# Patient Record
Sex: Female | Born: 1955 | Race: Black or African American | Hispanic: No | Marital: Single | Smoking: Former smoker
Health system: Southern US, Community
[De-identification: ages and names within clinical notes are randomized; demographics above are authoritative.]

## PROBLEM LIST (undated history)

## (undated) DIAGNOSIS — C50919 Malignant neoplasm of unspecified site of unspecified female breast: Secondary | ICD-10-CM

## (undated) DIAGNOSIS — I10 Essential (primary) hypertension: Secondary | ICD-10-CM

## (undated) DIAGNOSIS — J45909 Unspecified asthma, uncomplicated: Secondary | ICD-10-CM

## (undated) DIAGNOSIS — C801 Malignant (primary) neoplasm, unspecified: Secondary | ICD-10-CM

## (undated) DIAGNOSIS — Z9221 Personal history of antineoplastic chemotherapy: Secondary | ICD-10-CM

## (undated) DIAGNOSIS — Z923 Personal history of irradiation: Secondary | ICD-10-CM

## (undated) DIAGNOSIS — J449 Chronic obstructive pulmonary disease, unspecified: Secondary | ICD-10-CM

## (undated) HISTORY — PX: BREAST BIOPSY: SHX20

---

## 2015-12-25 ENCOUNTER — Telehealth: Payer: Self-pay

## 2015-12-25 NOTE — Telephone Encounter (Signed)
Pt called. She received a triage letter. Please call [phone]

## 2015-12-25 NOTE — Telephone Encounter (Signed)
Triaged pt today.  

## 2015-12-29 ENCOUNTER — Telehealth: Payer: Self-pay

## 2015-12-30 NOTE — Telephone Encounter (Signed)
LMOM for [facility], I have a question about the pt's meds.

## 2016-01-08 NOTE — Telephone Encounter (Signed)
Completed the triage.  

## 2016-01-12 NOTE — Telephone Encounter (Signed)
See separate triage.  

## 2016-01-12 NOTE — Telephone Encounter (Signed)
Gastroenterology Pre-Procedure Review  Request Date:12/29/2015 Requesting Physician: Burman Freestone, NP  PATIENT REVIEW QUESTIONS: The patient responded to the following health history questions as indicated:    Information given by pt's daughter, Jana Glasner She said that pt signs for herself  1. Diabetes Melitis: no 2. Joint replacements in the past 12 months: no 3. Major health problems in the past 3 months: no 4. Has an artificial valve or MVP: no 5. Has a defibrillator: no 6. Has been advised in past to take antibiotics in advance of a procedure like teeth cleaning: no 7. Family history of colon cancer: no  8. Alcohol Use: no 9. History of sleep apnea: no     MEDICATIONS & ALLERGIES:    Patient reports the following regarding taking any blood thinners:   Plavix? no Aspirin? no Coumadin? no  Patient confirms/reports the following medications:  Current Outpatient Prescriptions  Medication Sig Dispense Refill  . hydrochlorothiazide (MICROZIDE) 12.5 MG capsule Take 12.5 mg by mouth daily.    Marland Kitchen lisinopril (PRINIVIL,ZESTRIL) 10 MG tablet Take 10 mg by mouth daily.     No current facility-administered medications for this visit.    Patient confirms/reports the following allergies:  No Known Allergies  No orders of the defined types were placed in this encounter.    AUTHORIZATION INFORMATION Primary Insurance:   ID #:  Group #:  Pre-Cert / Auth required:  Pre-Cert / Auth #:   Secondary Insurance:   ID #:  Group #:  Pre-Cert / Auth required:  Pre-Cert / Auth #:   SCHEDULE INFORMATION: Procedure has been scheduled as follows:  Date:                 Time:   Location:   This Gastroenterology Pre-Precedure Review Form is being routed to the following provider(s): Barney Drain, MD

## 2016-01-13 NOTE — Telephone Encounter (Signed)
Needs appt

## 2016-01-13 NOTE — Telephone Encounter (Signed)
PREPOPIK-DRINK WATER TO KEEP URINE LIGHT YELLOW.   HOLD HYDROCHLOROTHIAZIDE on day BEFORE AND morning of TCS

## 2016-01-27 MED ORDER — SOD PICOSULFATE-MAG OX-CIT ACD 10-3.5-12 MG-GM-GM PO PACK: 1.0000 | PACK | ORAL | Status: DC

## 2016-01-27 NOTE — Telephone Encounter (Signed)
Pt requested a Thursday and has been scheduled for 03/18/2016 at 10:30 Am with Dr. Oneida Alar. Rx sent to the pharmacy and instructions mailed to pt.

## 2016-03-10 ENCOUNTER — Telehealth: Payer: Self-pay

## 2016-03-10 ENCOUNTER — Other Ambulatory Visit: Payer: Self-pay

## 2016-03-10 DIAGNOSIS — Z1211 Encounter for screening for malignant neoplasm of colon: Secondary | ICD-10-CM

## 2016-03-10 NOTE — Telephone Encounter (Signed)
I called pt to update triage prior to colonoscopy scheduled for 03/18/2016 and she said there has been no change in her meds.

## 2016-03-11 NOTE — Telephone Encounter (Signed)
REVIEWED-NO ADDITIONAL RECOMMENDATIONS. 

## 2016-03-16 NOTE — Telephone Encounter (Signed)
NO PA NEEDED FOR TCS PRE ONLINE WEB PAGE

## 2016-03-18 ENCOUNTER — Encounter (HOSPITAL_COMMUNITY)
Admission: RE | Disposition: A | Discharge status: Home / Self Care | Payer: Self-pay | Source: Ambulatory Visit | Attending: Gastroenterology

## 2016-03-18 ENCOUNTER — Encounter (HOSPITAL_COMMUNITY): Payer: Self-pay

## 2016-03-18 ENCOUNTER — Ambulatory Visit (HOSPITAL_COMMUNITY)
Admission: RE | Admit: 2016-03-18 | Discharge: 2016-03-18 | Disposition: A | Discharge status: Home / Self Care | Payer: Medicaid Other | Source: Ambulatory Visit | Attending: Gastroenterology | Admitting: Gastroenterology

## 2016-03-18 DIAGNOSIS — J45909 Unspecified asthma, uncomplicated: Secondary | ICD-10-CM | POA: Insufficient documentation

## 2016-03-18 DIAGNOSIS — D123 Benign neoplasm of transverse colon: Secondary | ICD-10-CM

## 2016-03-18 DIAGNOSIS — K644 Residual hemorrhoidal skin tags: Secondary | ICD-10-CM | POA: Diagnosis not present

## 2016-03-18 DIAGNOSIS — Z1211 Encounter for screening for malignant neoplasm of colon: Secondary | ICD-10-CM | POA: Insufficient documentation

## 2016-03-18 DIAGNOSIS — Z79899 Other long term (current) drug therapy: Secondary | ICD-10-CM | POA: Diagnosis not present

## 2016-03-18 DIAGNOSIS — K648 Other hemorrhoids: Secondary | ICD-10-CM | POA: Diagnosis not present

## 2016-03-18 DIAGNOSIS — Q438 Other specified congenital malformations of intestine: Secondary | ICD-10-CM | POA: Insufficient documentation

## 2016-03-18 DIAGNOSIS — D128 Benign neoplasm of rectum: Secondary | ICD-10-CM | POA: Insufficient documentation

## 2016-03-18 DIAGNOSIS — K5730 Diverticulosis of large intestine without perforation or abscess without bleeding: Secondary | ICD-10-CM | POA: Diagnosis not present

## 2016-03-18 DIAGNOSIS — I10 Essential (primary) hypertension: Secondary | ICD-10-CM | POA: Diagnosis not present

## 2016-03-18 DIAGNOSIS — F17200 Nicotine dependence, unspecified, uncomplicated: Secondary | ICD-10-CM | POA: Insufficient documentation

## 2016-03-18 HISTORY — DX: Unspecified asthma, uncomplicated: J45.909

## 2016-03-18 HISTORY — DX: Essential (primary) hypertension: I10

## 2016-03-18 HISTORY — PX: COLONOSCOPY: SHX5424

## 2016-03-18 SURGERY — COLONOSCOPY
Anesthesia: Moderate Sedation

## 2016-03-18 MED ORDER — MEPERIDINE HCL 100 MG/ML IJ SOLN
INTRAMUSCULAR | Status: AC
  Filled 2016-03-18: qty 2

## 2016-03-18 MED ORDER — MEPERIDINE HCL 100 MG/ML IJ SOLN
INTRAMUSCULAR | Status: DC | PRN
  Administered 2016-03-18 (×2): 25 mg via INTRAVENOUS

## 2016-03-18 MED ORDER — MIDAZOLAM HCL 5 MG/5ML IJ SOLN
INTRAMUSCULAR | Status: DC | PRN
  Administered 2016-03-18 (×2): 2 mg via INTRAVENOUS
  Administered 2016-03-18: 1 mg via INTRAVENOUS

## 2016-03-18 MED ORDER — SIMETHICONE 40 MG/0.6ML PO SUSP
ORAL | Status: DC | PRN
  Administered 2016-03-18: 10:00:00

## 2016-03-18 MED ORDER — MIDAZOLAM HCL 5 MG/5ML IJ SOLN
INTRAMUSCULAR | Status: AC
  Filled 2016-03-18: qty 10

## 2016-03-18 MED ORDER — SODIUM CHLORIDE 0.9 % IV SOLN
INTRAVENOUS | Status: DC
Start: 2016-03-18 — End: 2016-03-18
  Administered 2016-03-18: 10:00:00 via INTRAVENOUS

## 2016-03-18 NOTE — Discharge Instructions (Signed)
You have moderate internal and large external hemorrhoids and diverticulosis IN YOUR RIGHT COLON. YOU HAD THREE POLYPS REMOVED.   DRINK WATER TO KEEP YOUR URINE LIGHT YELLOW.  FOLLOW A HIGH FIBER DIET. AVOID ITEMS THAT CAUSE BLOATING. See info below.  YOUR BIOPSY RESULTS WILL BE AVAILABLE IN MY CHART JUL 3 AND MY OFFICE WILL CONTACT YOU IN 10-14 DAYS WITH YOUR RESULTS.   Next colonoscopy in 3 years.  Colonoscopy Care After Read the instructions outlined below and refer to this sheet in the next week. These discharge instructions provide you with general information on caring for yourself after you leave the hospital. While your treatment has been planned according to the most current medical practices available, unavoidable complications occasionally occur. If you have any problems or questions after discharge, call DR. FIELDS, [phone].  ACTIVITY  You may resume your regular activity, but move at a slower pace for the next 24 hours.   Take frequent rest periods for the next 24 hours.   Walking will help get rid of the air and reduce the bloated feeling in your belly (abdomen).   No driving for 24 hours (because of the medicine (anesthesia) used during the test).   You may shower.   Do not sign any important legal documents or operate any machinery for 24 hours (because of the anesthesia used during the test).    NUTRITION  Drink plenty of fluids.   You may resume your normal diet as instructed by your doctor.   Begin with a light meal and progress to your normal diet. Heavy or fried foods are harder to digest and may make you feel sick to your stomach (nauseated).   Avoid alcoholic beverages for 24 hours or as instructed.    MEDICATIONS  You may resume your normal medications.   WHAT YOU CAN EXPECT TODAY  Some feelings of bloating in the abdomen.   Passage of more gas than usual.   Spotting of blood in your stool or on the toilet paper  .  IF YOU HAD POLYPS  REMOVED DURING THE COLONOSCOPY:  Eat a soft diet IF YOU HAVE NAUSEA, BLOATING, ABDOMINAL PAIN, OR VOMITING.    FINDING OUT THE RESULTS OF YOUR TEST Not all test results are available during your visit. DR. Oneida Alar WILL CALL YOU WITHIN 14 DAYS OF YOUR PROCEDUE WITH YOUR RESULTS. Do not assume everything is normal if you have not heard from DR. FIELDS, CALL HER OFFICE AT [phone].  SEEK IMMEDIATE MEDICAL ATTENTION AND CALL THE OFFICE: [phone] IF:  You have more than a spotting of blood in your stool.   Your belly is swollen (abdominal distention).   You are nauseated or vomiting.   You have a temperature over 101F.   You have abdominal pain or discomfort that is severe or gets worse throughout the day.  High-Fiber Diet A high-fiber diet changes your normal diet to include more whole grains, legumes, fruits, and vegetables. Changes in the diet involve replacing refined carbohydrates with unrefined foods. The calorie level of the diet is essentially unchanged. The Dietary Reference Intake (recommended amount) for adult males is 38 grams per day. For adult females, it is 25 grams per day. Pregnant and lactating women should consume 28 grams of fiber per day. Fiber is the intact part of a plant that is not broken down during digestion. Functional fiber is fiber that has been isolated from the plant to provide a beneficial effect in the body. PURPOSE  Increase stool bulk.  Ease and regulate bowel movements.   Lower cholesterol.  REDUCE RISK OF COLON CANCER  INDICATIONS THAT YOU NEED MORE FIBER  Constipation and hemorrhoids.   Uncomplicated diverticulosis (intestine condition) and irritable bowel syndrome.   Weight management.   As a protective measure against hardening of the arteries (atherosclerosis), diabetes, and cancer.   GUIDELINES FOR INCREASING FIBER IN THE DIET  Start adding fiber to the diet slowly. A gradual increase of about 5 more grams (2 slices of  whole-wheat bread, 2 servings of most fruits or vegetables, or 1 bowl of high-fiber cereal) per day is best. Too rapid an increase in fiber may result in constipation, flatulence, and bloating.   Drink enough water and fluids to keep your urine clear or pale yellow. Water, juice, or caffeine-free drinks are recommended. Not drinking enough fluid may cause constipation.   Eat a variety of high-fiber foods rather than one type of fiber.   Try to increase your intake of fiber through using high-fiber foods rather than fiber pills or supplements that contain small amounts of fiber.   The goal is to change the types of food eaten. Do not supplement your present diet with high-fiber foods, but replace foods in your present diet.   INCLUDE A VARIETY OF FIBER SOURCES  Replace refined and processed grains with whole grains, canned fruits with fresh fruits, and incorporate other fiber sources. White rice, white breads, and most bakery goods contain little or no fiber.   Brown whole-grain rice, buckwheat oats, and many fruits and vegetables are all good sources of fiber. These include: broccoli, Brussels sprouts, cabbage, cauliflower, beets, sweet potatoes, white potatoes (skin on), carrots, tomatoes, eggplant, squash, berries, fresh fruits, and dried fruits.   Cereals appear to be the richest source of fiber. Cereal fiber is found in whole grains and bran. Bran is the fiber-rich outer coat of cereal grain, which is largely removed in refining. In whole-grain cereals, the bran remains. In breakfast cereals, the largest amount of fiber is found in those with "bran" in their names. The fiber content is sometimes indicated on the label.   You may need to include additional fruits and vegetables each day.   In baking, for 1 cup white flour, you may use the following substitutions:   1 cup whole-wheat flour minus 2 tablespoons.   1/2 cup white flour plus 1/2 cup whole-wheat flour.   Polyps, Colon  A polyp  is extra tissue that grows inside your body. Colon polyps grow in the large intestine. The large intestine, also called the colon, is part of your digestive system. It is a long, hollow tube at the end of your digestive tract where your body makes and stores stool. Most polyps are not dangerous. They are benign. This means they are not cancerous. But over time, some types of polyps can turn into cancer. Polyps that are smaller than a pea are usually not harmful. But larger polyps could someday become or may already be cancerous. To be safe, doctors remove all polyps and test them.   PREVENTION There is not one sure way to prevent polyps. You might be able to lower your risk of getting them if you:  Eat more fruits and vegetables and less fatty food.   Do not smoke.   Avoid alcohol.   Exercise every day.   Lose weight if you are overweight.   Eating more calcium and folate can also lower your risk of getting polyps. Some foods that are rich in  calcium are milk, cheese, and broccoli. Some foods that are rich in folate are chickpeas, kidney beans, and spinach.    Diverticulosis Diverticulosis is a common condition that develops when small pouches (diverticula) form in the wall of the colon. The risk of diverticulosis increases with age. It happens more often in people who eat a low-fiber diet. Most individuals with diverticulosis have no symptoms. Those individuals with symptoms usually experience belly (abdominal) pain, constipation, or loose stools (diarrhea).  HOME CARE INSTRUCTIONS  Increase the amount of fiber in your diet as directed by your caregiver or dietician. This may reduce symptoms of diverticulosis.   Drink at least 6 to 8 glasses of water each day to prevent constipation.   Try not to strain when you have a bowel movement.   Avoiding nuts and seeds to prevent complications is NOT NECESSARY.   FOODS HAVING HIGH FIBER CONTENT INCLUDE:  Fruits. Apple, peach, pear,  tangerine, raisins, prunes.   Vegetables. Brussels sprouts, asparagus, broccoli, cabbage, carrot, cauliflower, romaine lettuce, spinach, summer squash, tomato, winter squash, zucchini.   Starchy Vegetables. Baked beans, kidney beans, lima beans, split peas, lentils, potatoes (with skin).   Grains. Whole wheat bread, brown rice, bran flake cereal, plain oatmeal, white rice, shredded wheat, bran muffins.   SEEK IMMEDIATE MEDICAL CARE IF:  You develop increasing pain or severe bloating.   You have an oral temperature above 101F.   You develop vomiting or bowel movements that are bloody or black.   Hemorrhoids Hemorrhoids are dilated (enlarged) veins around the rectum. Sometimes clots will form in the veins. This makes them swollen and painful. These are called thrombosed hemorrhoids. Causes of hemorrhoids include:  Constipation.   Straining to have a bowel movement.   HEAVY LIFTING  HOME CARE INSTRUCTIONS  Eat a well balanced diet and drink 6 to 8 glasses of water every day to avoid constipation. You may also use a bulk laxative.   Avoid straining to have bowel movements.   Keep anal area dry and clean.   Do not use a donut shaped pillow or sit on the toilet for long periods. This increases blood pooling and pain.   Move your bowels when your body has the urge; this will require less straining and will decrease pain and pressure.

## 2016-03-18 NOTE — Op Note (Signed)
[facility] Patient Name: Jennifer Johns Procedure Date: 03/18/2016 10:07 AM MRN: [id number] Date of Birth: 1956/05/02 Attending MD: Barney Drain , MD CSN: [id number] Age: 60 Admit Type: Outpatient Procedure:                Colonoscopy WITH COLD FORCEPS/SNARE POLYPECTOMY Indications:              Screening for colorectal malignant neoplasm Providers:                Barney Drain, MD, Otis Peak B. Sharon Seller, RN, Isabella Stalling, Technician Referring MD:             Abran Richard Medicines:                Meperidine 50 mg IV, Midazolam 5 mg IV Complications:            No immediate complications. Estimated Blood Loss:     Estimated blood loss was minimal. Estimated blood                            loss was minimal. Procedure:                Pre-Anesthesia Assessment:                           - Prior to the procedure, a History and Physical                            was performed, and patient medications and                            allergies were reviewed. The patient's tolerance of                            previous anesthesia was also reviewed. The risks                            and benefits of the procedure and the sedation                            options and risks were discussed with the patient.                            All questions were answered, and informed consent                            was obtained. Prior Anticoagulants: The patient has                            taken no previous anticoagulant or antiplatelet                            agents. ASA Grade Assessment: I - A normal, healthy  patient. After reviewing the risks and benefits,                            the patient was deemed in satisfactory condition to                            undergo the procedure. After obtaining informed                            consent, the colonoscope was passed under direct                            vision. Throughout the  procedure, the patient's                            blood pressure, pulse, and oxygen saturations were                            monitored continuously. The [id number]                            scope was introduced through the anus and advanced                            to the the cecum, identified by appendiceal orifice                            and ileocecal valve. The ileocecal valve,                            appendiceal orifice, and rectum were photographed.                            The colonoscopy was somewhat difficult due to                            restricted mobility of the colon. Successful                            completion of the procedure was aided by increasing                            the dose of sedation medication, straightening and                            shortening the scope to obtain bowel loop reduction                            and COLOWRAP. The patient tolerated the procedure                            fairly well. The quality of the bowel preparation  was good. Scope In: 10:27:51 AM Scope Out: 10:52:39 AM Scope Withdrawal Time: 0 hours 19 minutes 43 seconds  Total Procedure Duration: 0 hours 24 minutes 48 seconds  Findings:      The recto-sigmoid colon was moderately redundant. Advancing the scope       required straightening and shortening the scope to obtain bowel loop       reduction.      Two sessile polyps were found in the rectum and transverse colon. The       polyps were 5 to 7 mm in size. These polyps were removed with a hot       snare. Resection and retrieval were complete.      A 3 mm polyp was found in the hepatic flexure. The polyp was sessile.       The polyp was removed with a cold biopsy forceps. Resection and       retrieval were complete.      A few small-mouthed diverticula were found in the hepatic flexure and       ascending colon.      Non-bleeding LARGE external and MODERATE internal  hemorrhoids were found. Impression:               - THREE COLORECTAL POLYPS REMOVED                           - MILD R COLON DIVERTICULOSIS                           - ANGULATED RECTOSIGMOID JUNCTION                           - LARGE EXTERNAL HEMORHOIDS Moderate Sedation:      Moderate (conscious) sedation was administered by the endoscopy nurse       and supervised by the endoscopist. The following parameters were       monitored: oxygen saturation, heart rate, blood pressure, and response       to care. Total physician intraservice time was 36 minutes. Recommendation:           - High fiber diet.                           - Continue present medications.                           - Await pathology results.                           - Repeat colonoscopy in 3 years for surveillance.                           - Patient has a contact number available for                            emergencies. The signs and symptoms of potential                            delayed complications were discussed with the  patient. Return to normal activities tomorrow.                            Written discharge instructions were provided to the                            patient. Procedure Code(s):        --- Professional ---                           [phone], Colonoscopy, flexible; with removal of                            tumor(s), polyp(s), or other lesion(s) by snare                            technique                           45380, 59, Colonoscopy, flexible; with biopsy,                            single or multiple                           99152, Moderate sedation services provided by the                            same physician or other qualified health care                            professional performing the diagnostic or                            therapeutic service that the sedation supports,                            requiring the presence of an independent  trained                            observer to assist in the monitoring of the                            patient's level of consciousness and physiological                            status; initial 15 minutes of intraservice time,                            patient age 53 years or older                           [phone], Moderate sedation services; each additional                            15 minutes intraservice  time Diagnosis Code(s):        --- Professional ---                           Z12.11, Encounter for screening for malignant                            neoplasm of colon CPT copyright 2016 [employer] Association. All rights reserved. The codes documented in this report are preliminary and upon coder review may  be revised to meet current compliance requirements. Barney Drain, MD Barney Drain, MD 03/18/2016 12:24:25 PM This report has been signed electronically. Number of Addenda: 0

## 2016-03-18 NOTE — H&P (Signed)
  Primary Care Physician:  [facility] Primary Gastroenterologist:  Dr. Oneida Alar  Pre-Procedure History & Physical: HPI:  Jennifer Johns is a 60 y.o. female here for [place].  Past Medical History  Diagnosis Date  . Hypertension   . Asthma     Past Surgical History  Procedure Laterality Date  . Vaginal birth after cesarean section      times3     Prior to Admission medications   Medication Sig Start Date End Date Taking? Authorizing Provider  hydrochlorothiazide (MICROZIDE) 12.5 MG capsule Take 12.5 mg by mouth daily.   Yes Historical Provider, MD  lisinopril (PRINIVIL,ZESTRIL) 10 MG tablet Take 10 mg by mouth daily.   Yes Historical Provider, MD  Sod Picosulfate-Mag Ox-Cit Acd 10-3.5-12 MG-GM-GM PACK Take 1 Container by mouth as directed. 01/27/16  Yes Danie Binder, MD    Allergies as of 03/10/2016  . (No Known Allergies)    History reviewed. No pertinent family history.  Social History   Social History  . Marital Status: Single    Spouse Name: N/A  . Number of Children: N/A  . Years of Education: N/A   Occupational History  . Not on file.   Social History Main Topics  . Smoking status: Current Some Day Smoker  . Smokeless tobacco: Not on file  . Alcohol Use: No  . Drug Use: No  . Sexual Activity: Not on file   Other Topics Concern  . Not on file   Social History Narrative  . No narrative on file    Review of Systems: See HPI, otherwise negative ROS   Physical Exam: BP 162/97 mmHg  Pulse 85  Temp(Src) 98.8 F (37.1 C) (Oral)  Resp 15  Ht 5\' 6"  (1.676 m)  Wt 130 lb (58.968 kg)  BMI 20.99 kg/m2  SpO2 99% General:   Alert,  pleasant and cooperative in NAD Head:  Normocephalic and atraumatic. Neck:  Supple; Lungs:  Clear throughout to auscultation.    Heart:  Regular rate and rhythm. Abdomen:  Soft, nontender and nondistended. Normal bowel sounds, without guarding, and without rebound.   Neurologic:  Alert  and  oriented x4;  grossly normal neurologically.  Impression/Plan:     SCREENING  Plan:  1. TCS TODAY

## 2016-03-22 ENCOUNTER — Encounter (HOSPITAL_COMMUNITY): Payer: Self-pay | Admitting: Gastroenterology

## 2016-04-06 ENCOUNTER — Telehealth: Payer: Self-pay | Admitting: Gastroenterology

## 2016-04-06 NOTE — Telephone Encounter (Signed)
Please call pt. She had THREE simple adenomas removed.   DRINK WATER TO KEEP YOUR URINE LIGHT YELLOW.  FOLLOW A HIGH FIBER DIET. AVOID ITEMS THAT CAUSE BLOATING.   Next colonoscopy in 3 years.

## 2016-04-07 NOTE — Telephone Encounter (Signed)
Pt is aware of results. Is not interested in [place].

## 2016-04-07 NOTE — Telephone Encounter (Signed)
Reminder in epic °

## 2017-06-01 ENCOUNTER — Other Ambulatory Visit (HOSPITAL_COMMUNITY): Payer: Self-pay | Admitting: Respiratory Therapy

## 2017-06-01 DIAGNOSIS — J441 Chronic obstructive pulmonary disease with (acute) exacerbation: Secondary | ICD-10-CM

## 2017-06-02 ENCOUNTER — Ambulatory Visit (HOSPITAL_COMMUNITY)
Admission: RE | Admit: 2017-06-02 | Discharge: 2017-06-02 | Disposition: A | Discharge status: Home / Self Care | Payer: Medicaid Other | Source: Ambulatory Visit | Attending: Pulmonary Disease | Admitting: Pulmonary Disease

## 2017-06-02 ENCOUNTER — Other Ambulatory Visit (HOSPITAL_COMMUNITY): Payer: Self-pay | Admitting: Pulmonary Disease

## 2017-06-02 DIAGNOSIS — J449 Chronic obstructive pulmonary disease, unspecified: Secondary | ICD-10-CM | POA: Insufficient documentation

## 2017-06-02 DIAGNOSIS — J410 Simple chronic bronchitis: Secondary | ICD-10-CM

## 2017-06-08 ENCOUNTER — Ambulatory Visit (HOSPITAL_COMMUNITY)
Admission: RE | Admit: 2017-06-08 | Discharge: 2017-06-08 | Disposition: A | Discharge status: Home / Self Care | Payer: Medicaid Other | Source: Ambulatory Visit | Attending: Pulmonary Disease | Admitting: Pulmonary Disease

## 2017-06-08 DIAGNOSIS — J441 Chronic obstructive pulmonary disease with (acute) exacerbation: Secondary | ICD-10-CM | POA: Diagnosis present

## 2017-06-08 MED ORDER — ALBUTEROL SULFATE (2.5 MG/3ML) 0.083% IN NEBU
2.5000 mg | INHALATION_SOLUTION | Freq: Once | RESPIRATORY_TRACT | Status: AC
Start: 2017-06-08 — End: 2017-06-08
  Administered 2017-06-08: 2.5 mg via RESPIRATORY_TRACT

## 2017-06-09 LAB — PULMONARY FUNCTION TEST
DL/VA % PRED: 59 %
DL/VA: 3.06 ml/min/mmHg/L
DLCO UNC: 7.49 ml/min/mmHg
DLCO cor % pred: 26 %
DLCO cor: 7.49 ml/min/mmHg
DLCO unc % pred: 26 %
FEF 25-75 Post: 0.38 L/sec
FEF 25-75 Pre: 0.24 L/sec
FEF2575-%Change-Post: 56 %
FEF2575-%PRED-POST: 17 %
FEF2575-%Pred-Pre: 10 %
FEV1-%CHANGE-POST: 31 %
FEV1-%Pred-Post: 29 %
FEV1-%Pred-Pre: 22 %
FEV1-Post: 0.68 L
FEV1-Pre: 0.51 L
FEV1FVC-%Change-Post: 6 %
FEV1FVC-%PRED-PRE: 48 %
FEV6-%Change-Post: 21 %
FEV6-%PRED-POST: 55 %
FEV6-%Pred-Pre: 45 %
FEV6-POST: 1.59 L
FEV6-Pre: 1.31 L
FEV6FVC-%Change-Post: -1 %
FEV6FVC-%PRED-POST: 98 %
FEV6FVC-%Pred-Pre: 100 %
FVC-%Change-Post: 23 %
FVC-%Pred-Post: 56 %
FVC-%Pred-Pre: 45 %
FVC-POST: 1.65 L
FVC-Pre: 1.34 L
POST FEV6/FVC RATIO: 96 %
Post FEV1/FVC ratio: 41 %
Pre FEV1/FVC ratio: 38 %
Pre FEV6/FVC Ratio: 97 %

## 2018-05-12 ENCOUNTER — Other Ambulatory Visit: Payer: Self-pay | Admitting: Surgery

## 2018-05-16 ENCOUNTER — Encounter: Payer: Self-pay | Admitting: Radiation Oncology

## 2018-05-16 ENCOUNTER — Telehealth: Payer: Self-pay | Admitting: Hematology

## 2018-05-16 NOTE — Telephone Encounter (Signed)
Pt has been scheduled to see Dr. Burr Medico on 9/5 at 230pm. Melanie from [facility] will notify the pt of the appt since she is scheduled to see Dr. Lisbeth Renshaw on the same day. Will call the pt for a genetic counseling appt.

## 2018-05-18 ENCOUNTER — Other Ambulatory Visit (HOSPITAL_COMMUNITY): Payer: Self-pay | Admitting: Surgery

## 2018-05-18 DIAGNOSIS — C50912 Malignant neoplasm of unspecified site of left female breast: Secondary | ICD-10-CM

## 2018-05-19 ENCOUNTER — Other Ambulatory Visit: Payer: Self-pay | Admitting: Surgery

## 2018-05-19 DIAGNOSIS — C50912 Malignant neoplasm of unspecified site of left female breast: Secondary | ICD-10-CM

## 2018-05-24 DIAGNOSIS — C50112 Malignant neoplasm of central portion of left female breast: Secondary | ICD-10-CM | POA: Insufficient documentation

## 2018-05-24 NOTE — Progress Notes (Signed)
Location of Breast Cancer: Infiltrating ductal carcinoma-Left Breast Cancer  Did patient present with symptoms (if so, please note symptoms) or was this found on screening mammography?: Patient found the mass at the 12 o'clock position in the left breast.  MRI Breasts 05/26/2018:  Mammogram 04/13/2018: 1.7 cm mass at the 12 position of the breast. 1 x 1 cm firm rubbery nodules, two at 10 o'clock 3 cm from the nipple, one at 12 o'clock 4 cm from the nipple.  Histology per Pathology Report: Left Breast 04/28/2018-[place]   Receptor Status: ER(-), PR (-), Her2-neu (-), Ki-67(75%)  Past/Anticipated interventions by surgeon, if any: Dr. 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 -I will refer her to the cancer center to see both medical and radiation oncology as well as genetics. -She also needs an MRI of her breast given the density of her breasts and the triple negative malignancy as well as her strong family history. -We discussed breast cancer surgery in detain including breast conservation versus mastectomy. -We also discussed the need for potential port-a-cath given her pathologic findings. -We also discussed lymph node biopsy. -At this point, we will make the referral's and I will discuss things with her after she has seen specialists.  Past/Anticipated interventions by medical oncology, if any: Chemotherapy  Dr. Burr Medico 05/25/2018 2:30 pm  Lymphedema issues, if any:  No  Pain issues, if any:  No  BP (!) 155/78 (BP Location: Right Arm, Patient Position: Sitting)   Pulse 73   Temp 98.5 F (36.9 C) (Oral)   Resp 20   Ht _0  (1.676 m)   Wt 137 lb 6.4 oz (62.3 kg)   SpO2 100%   BMI 22.18 kg/m    Wt Readings from Last 3 Encounters:  05/25/18 137 lb 6.4 oz (62.3 kg)  03/18/16 130 lb (59 kg)    SAFETY ISSUES:  Prior radiation? No  Pacemaker/ICD? No  Possible current pregnancy? No  Is the patient on methotrexate? No  Current Complaints /  other details:   [place]    Blenda Nicely [place], [place] 05/24/2018,11:41 AM

## 2018-05-24 NOTE — Progress Notes (Signed)
[place]   [phone] Fax:([phone]) [phone]   Clinic New Consult Note   Patient Care Team: [facility] as PCP - General 05/25/2018  Referring Provider: Dr. Ninfa Linden   CHIEF COMPLAINTS/PURPOSE OF CONSULTATION:  Newly diagnosed Left breast cancer   HISTORY OF PRESENTING ILLNESS:  Jennifer Johns 62 y.o. female is here because of newly diagnosed DCIS. She is accompanied by Her family members today.  She was referred by her breast surgeon Dr. Ninfa Linden.   He presented with a palpable mass in the upper left breast about 2 months ago, no tenderness, skin change or nipple discharge.  She otherwise feels well, denies pain or other symptoms.  She has not had screening mammogram for several years.  She presented to her primary care physician, and had screening mammogram on 04/13/2018 that was suspicious of malignancy in left breast.  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  She was referred to breast surgeon Dr. Ninfa Linden, surgery was discussed with her.  Today, she states that her last breast mass has been stale in size and not getting bigger. She denies weight loss, pain,  fatigue or changes in appetite.  She has DM, HTN, COPD and asthma. Her mother had breast cancer age 57 father had prostate cancer age 75.  She smokes a pack every 2-3 days for almost 45 years. She is on disability for COPD and asthma. She lives with some family members.  She lives in [place], but prefers to get her breast cancer treatment in [place].  MEDICAL HISTORY:  Past Medical History:  Diagnosis Date  . Asthma   . COPD (chronic obstructive pulmonary disease) ([place])   . Hypertension     SURGICAL HISTORY: Past Surgical History:  Procedure Laterality Date  . COLONOSCOPY N/A 03/18/2016   Procedure: COLONOSCOPY;  Surgeon: Danie Binder, MD;  [facility];  Service: Endoscopy;  Laterality: N/A;  10:30 AM  . VAGINAL BIRTH AFTER CESAREAN SECTION     times3     SOCIAL HISTORY: Social History   Socioeconomic History  . Marital status: Single    Spouse name: Not on file  . Number of children: Not on file  . Years of education: Not on file  . Highest education level: Not on file  Occupational History  . Not on file  Social Needs  . Financial resource strain: Not on file  . Food insecurity:    Worry: Not on file    Inability: Not on file  . Transportation needs:    Medical: Not on file    Non-medical: Not on file  Tobacco Use  . Smoking status: Current Some Day Smoker    Packs/day: 0.50    Years: 45.00    Pack years: 22.50  . Smokeless tobacco: Never Used  Substance and Sexual Activity  . Alcohol use: No  . Drug use: No  . Sexual activity: Not on file  Lifestyle  . Physical activity:    Days per week: Not on file    Minutes per session: Not on file  . Stress: Not on file  Relationships  . Social connections:    Talks on phone: Not on file    Gets together: Not on file    Attends religious service: Not on file    Active member of club or organization: Not on file  Attends meetings of clubs or organizations: Not on file    Relationship status: Not on file  . Intimate partner violence:    Fear of current or ex partner: Not on file    Emotionally abused: Not on file    Physically abused: Not on file    Forced sexual activity: Not on file  Other Topics Concern  . Not on file  Social History Narrative  . Not on file    FAMILY HISTORY: Family History  Problem Relation Age of Onset  . Cancer Mother 48       breast cancer  . Cancer Father 10       prostate cancer    ALLERGIES:  is allergic to carrot [daucus carota].  MEDICATIONS:  Current Outpatient Medications  Medication Sig Dispense Refill  . albuterol (ACCUNEB) 1.25 MG/3ML nebulizer solution Take 1 ampule by nebulization 2 (two) times daily.     Marland Kitchen albuterol (PROVENTIL HFA;VENTOLIN HFA) 108 (90 Base) MCG/ACT inhaler Inhale 2 puffs into the lungs 2 (two) times daily.    . cholecalciferol (VITAMIN D) 1000 units tablet   1  . hydrochlorothiazide (MICROZIDE) 12.5 MG capsule Take 12.5 mg by mouth daily.    Marland Kitchen lisinopril (PRINIVIL,ZESTRIL) 10 MG tablet Take 10 mg by mouth daily.    . ranitidine (ZANTAC) 150 MG tablet   1   No current facility-administered medications for this visit.     REVIEW OF SYSTEMS:   Constitutional: Denies fevers, chills or abnormal night sweats Eyes: Denies blurriness of vision, double vision or watery eyes Ears, nose, mouth, throat, and face: Denies mucositis or sore throat Respiratory: Denies cough, dyspnea or wheezes (+) COPD and asthma Cardiovascular: Denies palpitation, chest discomfort or lower extremity swelling Gastrointestinal:  Denies nausea, heartburn or change in bowel habits Skin: Denies abnormal skin rashes Lymphatics: Denies new lymphadenopathy or easy bruising Neurological:Denies numbness, tingling or new weaknesses Behavioral/Psych: Mood is stable, no new changes  Breast: (+) palpable mass on left breast All other systems were reviewed with the patient and are negative.  PHYSICAL EXAMINATION:  ECOG PERFORMANCE STATUS: 0 - Asymptomatic  Vitals:   05/25/18 1408  BP: (!) 144/76  Pulse: 92  Resp: 18  Temp: 98.5 F (36.9 C)  SpO2: 95%   Filed Weights   05/25/18 1408  Weight: 138 lb 1.6 oz (62.6 kg)    GENERAL:alert, no distress and comfortable SKIN: skin color, texture, turgor are normal, no rashes or significant lesions EYES: normal, conjunctiva are pink and non-injected, sclera clear OROPHARYNX:no exudate, no erythema and lips, buccal mucosa, and tongue normal  NECK: supple, thyroid normal size, non-tender, without nodularity LYMPH:  no palpable lymphadenopathy in the cervical, axillary or inguinal LUNGS: clear to auscultation and percussion with normal breathing effort HEART:  regular rate & rhythm and no murmurs and no lower extremity edema ABDOMEN:abdomen soft, non-tender and normal bowel sounds Musculoskeletal:no cyanosis of digits and no clubbing  PSYCH: alert & oriented x 3 with fluent speech NEURO: no focal motor/sensory deficits Breast:(+)  1.8x2cm in the left breat about 5 cm from the nipple 10 O' clock position   LABORATORY DATA:  I have reviewed the data as listed    No flowsheet data found.  No flowsheet data found.  PATHOLOGY:   04/28/2018 Surgical Pathology   RADIOGRAPHIC STUDIES: I have personally reviewed the radiological images as listed and agreed with the findings in the report.   04/13/2018 Mammogram   ASSESSMENT & PLAN:  HARPER VANDERVOORT is a  62 y.o. lovely female with a history of HTN, COPD, DM , GERD and asthma   1.  Cancer of central portion of left breast, invasive ductal carcinoma, cT1cN0M0, stage IB, grade 3, ER - /PR-/HER2-  -I discussed her breast imaging and needle biopsy results with patient and her family members in great detail. -She is a candidate for breast conservation surgery. She has been seen by breast surgeon Dr. Ninfa Linden, who recommends lumpectomy and sentinel lymph node biopsy. -Due to her dense breast tissue, breast MRI was ordered by Dr. Ninfa Linden, which is scheduled for next week.  We discussed that if the MRI shows additional lesion, or enlarged lymph nodes, she would need additional biopsy. -Due to the early stage disease, I did not think she needs staging scan for now. -Given her strong family history of breast cancer, we recommend her to undergo genetic testing to ruled out inheritable breast cancer, she was referred to our genetic counselor. -We discussed the risk of cancer recurrence after complete surgical resection.  Due to the triple negative disease, the risk will be high, and I recommend adjuvant chemotherapy to reduce risk of recurrence.  We also reviewed the role of neoadjuvant chemotherapy, if the  breast MRI shows a large breast tumor, or positive lymph nodes. -We discussed adjuvant chemotherapy regimens, including dose dense Adriamycin and Cytoxan every 2 weeks for 4 cycles, followed by Taxol every 1 or 2 weeks, for 2 to 3 months, versus docetaxel and Cytoxan every 3 weeks for 4-6 cycles.  Will make a final decision based on her surgical pathology findings.  We reviewed the benefits and potential side effects from chemo, she is agreeable to have adjuvant chemo. -She was also seen by radiation oncologist Dr. Lisbeth Renshaw this morning, she will have adjuvant radiation after chemotherapy. -We also discussed breast cancer surveillance after she completes the above treatments, she will continue annual mammogram, and routine follow-up with [place] every 3 to 12 months for a total of 5 years.  2. COPD, HTN -She will continue her medication, and follow-up with primary care physician.  3.  Smoking cessation -I strongly encouraged her to stop smoking completely, which will help her recovery from surgery.  Plan -She is scheduled to have breast MRI next week  -she will likely have lumpectomy and sentinel lymph node biopsy with port placement soon -Genetic refer -I'll see her 2-3 weeks after her surgery to review her pathology result and finalize her antiestrogen therapy.      No orders of the defined types were placed in this encounter.   All questions were answered. The patient knows to call the clinic with any problems, questions or concerns. I spent 50 minutes counseling the patient face to face. The total time spent in the appointment was 60 minutes and more than 50% was on counseling.  Dierdre Searles Dweik am acting as scribe for Dr. Truitt Merle.  I have reviewed the above documentation for accuracy and completeness, and I agree with the above.     Truitt Merle, MD 05/25/2018

## 2018-05-25 ENCOUNTER — Telehealth: Payer: Self-pay | Admitting: Hematology

## 2018-05-25 ENCOUNTER — Ambulatory Visit
Admission: RE | Admit: 2018-05-25 | Discharge: 2018-05-25 | Disposition: A | Discharge status: Home / Self Care | Payer: Self-pay | Source: Ambulatory Visit | Attending: Radiation Oncology | Admitting: Radiation Oncology

## 2018-05-25 ENCOUNTER — Other Ambulatory Visit: Payer: Self-pay

## 2018-05-25 ENCOUNTER — Ambulatory Visit
Admission: RE | Admit: 2018-05-25 | Discharge: 2018-05-25 | Disposition: A | Discharge status: Home / Self Care | Payer: Medicaid Other | Source: Ambulatory Visit | Attending: Radiation Oncology | Admitting: Radiation Oncology

## 2018-05-25 ENCOUNTER — Inpatient Hospital Stay: Payer: Medicaid Other | Attending: Hematology | Admitting: Hematology

## 2018-05-25 ENCOUNTER — Encounter: Payer: Self-pay | Admitting: Radiation Oncology

## 2018-05-25 ENCOUNTER — Other Ambulatory Visit: Payer: Self-pay | Admitting: Radiation Oncology

## 2018-05-25 ENCOUNTER — Encounter: Payer: Self-pay | Admitting: Hematology

## 2018-05-25 ENCOUNTER — Telehealth: Payer: Self-pay

## 2018-05-25 VITALS — BP 155/78 | HR 73 | Temp 98.5°F | Resp 20 | Ht 66.0 in | Wt 137.4 lb

## 2018-05-25 DIAGNOSIS — E119 Type 2 diabetes mellitus without complications: Secondary | ICD-10-CM | POA: Diagnosis not present

## 2018-05-25 DIAGNOSIS — C50112 Malignant neoplasm of central portion of left female breast: Secondary | ICD-10-CM | POA: Insufficient documentation

## 2018-05-25 DIAGNOSIS — I10 Essential (primary) hypertension: Secondary | ICD-10-CM | POA: Insufficient documentation

## 2018-05-25 DIAGNOSIS — F17200 Nicotine dependence, unspecified, uncomplicated: Secondary | ICD-10-CM | POA: Diagnosis not present

## 2018-05-25 DIAGNOSIS — J449 Chronic obstructive pulmonary disease, unspecified: Secondary | ICD-10-CM | POA: Diagnosis not present

## 2018-05-25 DIAGNOSIS — C50919 Malignant neoplasm of unspecified site of unspecified female breast: Secondary | ICD-10-CM

## 2018-05-25 DIAGNOSIS — K219 Gastro-esophageal reflux disease without esophagitis: Secondary | ICD-10-CM | POA: Diagnosis not present

## 2018-05-25 DIAGNOSIS — J45909 Unspecified asthma, uncomplicated: Secondary | ICD-10-CM | POA: Insufficient documentation

## 2018-05-25 DIAGNOSIS — Z171 Estrogen receptor negative status [ER-]: Secondary | ICD-10-CM | POA: Insufficient documentation

## 2018-05-25 DIAGNOSIS — F17210 Nicotine dependence, cigarettes, uncomplicated: Secondary | ICD-10-CM | POA: Diagnosis not present

## 2018-05-25 DIAGNOSIS — C50212 Malignant neoplasm of upper-inner quadrant of left female breast: Secondary | ICD-10-CM

## 2018-05-25 DIAGNOSIS — Z79899 Other long term (current) drug therapy: Secondary | ICD-10-CM | POA: Diagnosis not present

## 2018-05-25 HISTORY — DX: Chronic obstructive pulmonary disease, unspecified: J44.9

## 2018-05-25 NOTE — Telephone Encounter (Signed)
Requested most recent lab results be faxed to [place] from [facility].

## 2018-05-25 NOTE — Progress Notes (Addendum)
Radiation Oncology         [phone] ________________________________  Name: Jennifer Johns        MRN: [id number]  Date of Service: 05/25/2018 DOB: [birth year]  CC:[facility]  Coralie Keens, MD     REFERRING PHYSICIAN: Coralie Keens, MD   DIAGNOSIS: The primary encounter diagnosis was Malignant neoplasm of upper-inner quadrant of left breast in female, estrogen receptor negative ([place]). A diagnosis of Malignant neoplasm of central portion of left breast in female, estrogen receptor negative ([place]) was also pertinent to this visit.   HISTORY OF PRESENT ILLNESS: Jennifer Johns is a 62 y.o. female with a new diagnosis of left breast cancer. The patient was noted to have a mass in the left breast that she palpated at home.  She had a mammogram in February 2018 that was negative, and has no prior history of masses or cancer.  She underwent diagnostic imaging with mammogram in [place] revealing a 1.7 cm mass at 12:00.  Ultrasound revealed an 18 mm lesion in the upper inner quadrant, and no evidence of any adenopathy.  A biopsy on 04/28/2018 revealed a grade 3 invasive ductal carcinoma which was triple negative, and a Ki-67 of 75%.  She comes today to discuss treatment recommendations for her cancer.  She is scheduled to undergo an MRI tomorrow, and sees Dr. Burr Medico today at 230.  Discussion has previously included chemotherapy either in the adjuvant or neoadjuvant setting followed by lumpectomy and that she appears to be a candidate for breast conservation.   PREVIOUS RADIATION THERAPY: No   PAST MEDICAL HISTORY:  Past Medical History:  Diagnosis Date  . Asthma   . COPD (chronic obstructive pulmonary disease) ([place])   . Hypertension        PAST SURGICAL HISTORY: Past Surgical History:  Procedure Laterality Date  . COLONOSCOPY N/A 03/18/2016   Procedure: COLONOSCOPY;  Surgeon: Danie Binder, MD;  [facility];  Service: Endoscopy;   Laterality: N/A;  10:30 AM  . VAGINAL BIRTH AFTER CESAREAN SECTION     times3      FAMILY HISTORY: History reviewed. No pertinent family history.   SOCIAL HISTORY:  reports that she has been smoking. She has never used smokeless tobacco. She reports that she does not drink alcohol or use drugs.  The patient is single and resides in [place].  She is disabled but helps to take care of her granddaughter.   ALLERGIES: Carrot [daucus carota]   MEDICATIONS:  Current Outpatient Medications  Medication Sig Dispense Refill  . cholecalciferol (VITAMIN D) 1000 units tablet   1  . hydrochlorothiazide (MICROZIDE) 12.5 MG capsule Take 12.5 mg by mouth daily.    Marland Kitchen lisinopril (PRINIVIL,ZESTRIL) 10 MG tablet Take 10 mg by mouth daily.    . ranitidine (ZANTAC) 150 MG tablet   1   No current facility-administered medications for this encounter.      REVIEW OF SYSTEMS: On review of systems, the patient reports that she is doing well overall. She denies any chest pain, shortness of breath, cough, fevers, chills, night sweats, unintended weight changes. She denies any bowel or bladder disturbances, and denies abdominal pain, nausea or vomiting. She denies any new musculoskeletal or joint aches or pains. A complete review of systems is obtained and is otherwise negative.     PHYSICAL EXAM:  Wt Readings from Last 3 Encounters:  05/25/18 137 lb 6.4 oz (62.3 kg)  03/18/16 130 lb (  59 kg)   Temp Readings from Last 3 Encounters:  05/25/18 98.5 F (36.9 C) (Oral)  03/18/16 97.7 F (36.5 C) (Oral)   BP Readings from Last 3 Encounters:  05/25/18 (!) 155/78  03/18/16 124/78   Pulse Readings from Last 3 Encounters:  05/25/18 73  03/18/16 89     In general this is a well appearing African American female in no acute distress. She is alert and oriented x4 and appropriate throughout the examination. HEENT reveals that the patient is normocephalic, atraumatic. EOMs are intact. Skin is  intact without any evidence of gross lesions.  Cardiopulmonary assessment is negative for acute distress and she exhibits normal effort. Breast exam is deferred.    ECOG = 0  0 - Asymptomatic (Fully active, able to carry on all predisease activities without restriction)  1 - Symptomatic but completely ambulatory (Restricted in physically strenuous activity but ambulatory and able to carry out work of a light or sedentary nature. For example, light housework, office work)  2 - Symptomatic, <50% in bed during the day (Ambulatory and capable of all self care but unable to carry out any work activities. Up and about more than 50% of waking hours)  3 - Symptomatic, >50% in bed, but not bedbound (Capable of only limited self-care, confined to bed or chair 50% or more of waking hours)  4 - Bedbound (Completely disabled. Cannot carry on any self-care. Totally confined to bed or chair)  5 - Death   Eustace Pen MM, Creech RH, Tormey DC, et al. [phone]). "Toxicity and response criteria of the [facility]". [place] Oncol. 5 (6): 649-55    LABORATORY DATA:  No results found for: WBC, HGB, HCT, MCV, PLT No results found for: NA, K, CL, CO2 No results found for: ALT, AST, GGT, ALKPHOS, BILITOT    RADIOGRAPHY: No results found.     IMPRESSION/PLAN: 1. 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 Lisbeth Renshaw discusses the pathology findings and reviews the nature of triple negative left breast disease. The consensus from the breast conference includes proceeding with MRI for extent of disease, and placement of Port-A-Cath.  She is a candidate for chemotherapy either in the adjuvant or neoadjuvant setting, and will discuss this further with Dr. Burr Medico.  She also appears to be a candidate for breast conservation with lumpectomy with sentinel node biopsy.  We reviewed the rationale for consideration of adjuvant radiotherapy given her findings,  and would anticipate this following breast conservation.  If she did have mastectomy, there are scenarios for treating triple negative patients in the postmastectomy setting, specifically T2 N0 disease or nodal involvement or margins.  She is in agreement and understands.  We discussed the risks, benefits, short, and long term effects of radiotherapy, and the patient is interested in proceeding. Dr. Lisbeth Renshaw discusses the delivery and logistics of radiotherapy and anticipates a course of 4 or 6 1/2 weeks of radiotherapy. We will see her back about 2 weeks after completing local and systemic therapy to discuss the timing of initiating radiotherapy. 2. Possible genetic predisposition to malignancy. The patient is a candidate for genetic testing given her personal and family history. She was offered referral and accepts. We will coordinate this for her.  In a visit lasting 60 minutes, greater than 50% of the time was spent face to face discussing her case, and coordinating the patient's care.     The above documentation reflects my direct findings during  this shared patient visit. Please see the separate note by Dr. Lisbeth Renshaw on this date for the remainder of the patient's plan of care.    Carola Rhine, PAC

## 2018-05-25 NOTE — Addendum Note (Signed)
Encounter addended by: Hayden Pedro, PA-C on: 05/25/2018 11:26 AM  Actions taken: Sign clinical note

## 2018-05-25 NOTE — Telephone Encounter (Signed)
Tried to call regarding 9/9

## 2018-05-26 ENCOUNTER — Telehealth: Payer: Self-pay | Admitting: Hematology

## 2018-05-26 ENCOUNTER — Ambulatory Visit
Admission: RE | Admit: 2018-05-26 | Discharge: 2018-05-26 | Disposition: A | Discharge status: Home / Self Care | Payer: Medicaid Other | Source: Ambulatory Visit | Attending: Surgery | Admitting: Surgery

## 2018-05-26 ENCOUNTER — Other Ambulatory Visit: Payer: Self-pay | Admitting: Surgery

## 2018-05-26 DIAGNOSIS — C50912 Malignant neoplasm of unspecified site of left female breast: Secondary | ICD-10-CM

## 2018-05-26 NOTE — Telephone Encounter (Signed)
No los 9/5 °

## 2018-05-29 ENCOUNTER — Inpatient Hospital Stay: Payer: Medicaid Other

## 2018-05-29 ENCOUNTER — Inpatient Hospital Stay: Payer: Medicaid Other | Admitting: Genetics

## 2018-05-29 ENCOUNTER — Ambulatory Visit
Admission: RE | Admit: 2018-05-29 | Discharge: 2018-05-29 | Disposition: A | Discharge status: Home / Self Care | Payer: Medicaid Other | Source: Ambulatory Visit | Attending: Surgery | Admitting: Surgery

## 2018-05-29 DIAGNOSIS — C50912 Malignant neoplasm of unspecified site of left female breast: Secondary | ICD-10-CM

## 2018-05-29 MED ORDER — GADOBENATE DIMEGLUMINE 529 MG/ML IV SOLN
14.0000 mL | Freq: Once | INTRAVENOUS | Status: AC | PRN
  Administered 2018-05-29: 14 mL via INTRAVENOUS

## 2018-05-30 ENCOUNTER — Telehealth: Payer: Self-pay | Admitting: *Deleted

## 2018-05-30 NOTE — Telephone Encounter (Signed)
Called pt to discuss navigation resources and to provide contact information. Denies questions or concerns regarding dx or treatment care plan. Encourage pt to call with needs. Received verbal understanding.

## 2018-05-31 ENCOUNTER — Other Ambulatory Visit: Payer: Self-pay | Admitting: Surgery

## 2018-05-31 DIAGNOSIS — Z853 Personal history of malignant neoplasm of breast: Secondary | ICD-10-CM

## 2018-06-05 ENCOUNTER — Other Ambulatory Visit: Payer: Medicaid Other

## 2018-06-06 ENCOUNTER — Encounter (HOSPITAL_COMMUNITY): Payer: Self-pay | Admitting: *Deleted

## 2018-06-06 ENCOUNTER — Telehealth: Payer: Self-pay | Admitting: Hematology

## 2018-06-06 ENCOUNTER — Other Ambulatory Visit: Payer: Self-pay

## 2018-06-06 NOTE — Telephone Encounter (Signed)
Spoke to pt regarding upcoming appts per 9/12 sch message   °

## 2018-06-06 NOTE — H&P (Addendum)
Jennifer Johns Documented: 05/12/2018 9:52 AM Location: [facility] Patient #: [id number] DOB: [birth year] Single / Language: Cleophus Molt / Race: Black or African American Female   History of Present Illness (Kierstyn Baranowski A. Ninfa Linden MD; 05/12/2018 10:14 AM) The patient is a 62 year old female who presents with breast cancer. This patient is referred by Burman Freestone FNP for the recent diagnosis of a left breast cancer. All her imaging studies were done in [place]. She reports that she was examined herself and found a mass at the 12 o'clock position of left breast. Her last mammograms part of this were in February 2018. She has no previous history of breast masses or breast cancer. She denies nipple discharge. She has no breast pain. She underwent mammograms showing a 1.7 cm mass at the 12 position of the breast. She had dense breasts and benign calcifications through both breasts. She underwent a core biopsy of the mass. 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 Ki-67 was 75%. She has a family history of breast cancer her mother which was diagnosed in her 77s. Her mother had undergone a lumpectomy with radiation and chemotherapy. She has a daughter who is here with [place] today who is had no problems with her breasts. She is otherwise healthy without complaints.   Past Surgical History (Tanisha A. Owens Shark, [place]; 05/12/2018 9:53 AM) Breast Biopsy  Left.  Diagnostic Studies History (Tanisha A. Owens Shark, [place]; 05/12/2018 9:53 AM) Colonoscopy  1-5 years ago Mammogram  1-3 years ago  Allergies (Tanisha A. Owens Shark, [place]; 05/12/2018 9:53 AM) No Known Drug Allergies [05/12/2018]: Allergies Reconciled   Medication History (Tanisha A. Owens Shark, [place]; 05/12/2018 9:54 AM) HydroCHLOROthiazide (12.5MG Capsule, Oral) Active. Lisinopril (10MG Tablet, Oral) Active. RaNITidine HCl (150MG Tablet, Oral) Active. Vitamin D3 (1000UNIT Tablet, Oral) Active. Medications  Reconciled  Social History (Tanisha A. Owens Shark, [place]; 05/12/2018 9:53 AM) Alcohol use  Remotely quit alcohol use. Caffeine use  Carbonated beverages, Tea. Illicit drug use  Remotely quit drug use. Tobacco use  Former smoker.  Family History (Tanisha A. Owens Shark, [place]; 05/12/2018 9:53 AM) Breast Cancer  Mother. Cerebrovascular Accident  Daughter. Diabetes Mellitus  Father. Hypertension  Father, Mother. Ischemic Bowel Disease  Daughter. Prostate Cancer  Father.  Pregnancy / Birth History (Tanisha A. Owens Shark, [place]; 05/12/2018 9:53 AM) Age at menarche  35 years. Age of menopause  61-55 Gravida  3 Irregular periods  Maternal age  57-20 Para  3  Other Problems (Tanisha A. Owens Shark, [place]; 05/12/2018 9:53 AM) Asthma  Chronic Obstructive Lung Disease  Gastroesophageal Reflux Disease  High blood pressure  Lump In Breast     Review of Systems (Tanisha A. Brown RMA; 05/12/2018 9:53 AM) General Present- Night Sweats. Not Present- Appetite Loss, Chills, Fatigue, Fever, Weight Gain and Weight Loss. Skin Not Present- Change in Wart/Mole, Dryness, Hives, Jaundice, New Lesions, Non-Healing Wounds, Rash and Ulcer. HEENT Present- Wears glasses/contact lenses. Not Present- Earache, Hearing Loss, Hoarseness, Nose Bleed, Oral Ulcers, Ringing in the Ears, Seasonal Allergies, Sinus Pain, Sore Throat, Visual Disturbances and Yellow Eyes. Breast Present- Breast Mass. Not Present- Breast Pain, Nipple Discharge and Skin Changes. Cardiovascular Not Present- Chest Pain, Difficulty Breathing Lying Down, Leg Cramps, Palpitations, Rapid Heart Rate, Shortness of Breath and Swelling of Extremities. Female Genitourinary Present- Nocturia. Not Present- Frequency, Painful Urination, Pelvic Pain and Urgency. Musculoskeletal Not Present- Back Pain, Joint Pain, Joint Stiffness, Muscle Pain, Muscle Weakness and Swelling of Extremities. Neurological Not Present- Decreased Memory, Fainting, Headaches, Numbness,  Seizures, Tingling,  Tremor, Trouble walking and Weakness. Psychiatric Not Present- Anxiety, Bipolar, Change in Sleep Pattern, Depression, Fearful and Frequent crying. Endocrine Present- Hot flashes. Not Present- Cold Intolerance, Excessive Hunger, Hair Changes, Heat Intolerance and New Diabetes.  Vitals (Tanisha A. Brown RMA; 05/12/2018 9:53 AM) 05/12/2018 9:53 AM Weight: 139 lb Height: 66in Body Surface Area: 1.71 m Body Mass Index: 22.43 kg/m  Temp.: 98.21F  Pulse: 103 (Regular)  BP: 138/86 (Sitting, Left Arm, Standard)       Physical Exam (Saysha Menta A. Ninfa Linden MD; 05/12/2018 10:15 AM) General Mental Status-Alert. General Appearance-Consistent with stated age. Hydration-Well hydrated. Voice-Normal.  Head and Neck Head-normocephalic, atraumatic with no lesions or palpable masses. Trachea-midline. Thyroid Gland Characteristics - normal size and consistency.  Eye Eyeball - Bilateral-Extraocular movements intact. Sclera/Conjunctiva - Bilateral-No scleral icterus.  Chest and Lung Exam Chest and lung exam reveals -quiet, even and easy respiratory effort with no use of accessory muscles and on auscultation, normal breath sounds, no adventitious sounds and normal vocal resonance. Inspection Chest Wall - Normal. Back - normal.  Breast Breast - Left-Symmetric, Non Tender, No Biopsy scars, no Dimpling, No Inflammation, No Lumpectomy scars, No Mastectomy scars, No Peau d' Orange. Breast - Right-Symmetric, Non Tender, No Biopsy scars, no Dimpling, No Inflammation, No Lumpectomy scars, No Mastectomy scars, No Peau d' Orange. Note: In the left breast, there is about a 2 cm mass at the 12 position several centimeters from the nipple. I can feel no other masses and no axillary adenopathy.   Cardiovascular Cardiovascular examination reveals -normal heart sounds, regular rate and rhythm with no murmurs and normal pedal pulses  bilaterally.  Abdomen Inspection Inspection of the abdomen reveals - No Hernias. Skin - Scar - no surgical scars. Palpation/Percussion Palpation and Percussion of the abdomen reveal - Soft, Non Tender, No Rebound tenderness, No Rigidity (guarding) and No hepatosplenomegaly. Auscultation Auscultation of the abdomen reveals - Bowel sounds normal.  Neurologic Neurologic evaluation reveals -alert and oriented x 3 with no impairment of recent or remote memory. Mental Status-Normal.  Musculoskeletal - Did not examine.  Lymphatic Head & Neck  General Head & Neck Lymphatics: Bilateral - Description - Normal. Axillary  General Axillary Region: Bilateral - Description - Normal. Tenderness - Non Tender. Femoral & Inguinal - Did not examine.    Assessment & Plan (Neliah Cuyler A. Ninfa Linden MD; 05/12/2018 10:16 AM) BREAST CANCER, LEFT (C50.912) Impression: I have reviewed the patient's mammograms and reports as well as her biopsy results. I have discussed this with her daughter. She has an invasive triple negative left breast cancer. I discussed this with him in detail. She also has a strong family history. I will refer her to the cancer center to see both medical and radiation oncology as well as genetics. She also needs an MRI of her breast given the density of her breasts and the triple negative malignancy as well as her strong family history. We discussed breast cancer surgery in detail including breast conservation versus mastectomy. We also discussed the need for potential Port-A-Cath given her pathologic findings. We also discussed lymph node biopsy. At this point, we will make the referral's and I will discuss things with her after she has seen specialists.  ADDENDUM:  Her MRI only showed a 2.3 cm mass in the upper central portion of the left breast with no other abnormalities.  She has been discussed by the oncologist.  We will now proceed with left breast partial mastectomy and sentinel node  biopsy as well as a Port-A-Cath insertion.  Again we discussed the risks in detail including the risk of pneumothorax with port placement and the need for further surgery if margins or nodes are positive.

## 2018-06-07 ENCOUNTER — Ambulatory Visit (HOSPITAL_COMMUNITY): Payer: Medicaid Other

## 2018-06-07 ENCOUNTER — Encounter (HOSPITAL_COMMUNITY)
Admission: RE | Disposition: A | Discharge status: Home / Self Care | Payer: Self-pay | Source: Ambulatory Visit | Attending: Surgery

## 2018-06-07 ENCOUNTER — Ambulatory Visit (HOSPITAL_COMMUNITY): Payer: Medicaid Other | Admitting: Anesthesiology

## 2018-06-07 ENCOUNTER — Encounter (HOSPITAL_COMMUNITY): Payer: Self-pay | Admitting: Urology

## 2018-06-07 ENCOUNTER — Ambulatory Visit (HOSPITAL_COMMUNITY)
Admission: RE | Admit: 2018-06-07 | Discharge: 2018-06-07 | Disposition: A | Discharge status: Home / Self Care | Payer: Medicaid Other | Source: Ambulatory Visit | Attending: Surgery | Admitting: Surgery

## 2018-06-07 DIAGNOSIS — C50112 Malignant neoplasm of central portion of left female breast: Secondary | ICD-10-CM | POA: Insufficient documentation

## 2018-06-07 DIAGNOSIS — Z833 Family history of diabetes mellitus: Secondary | ICD-10-CM | POA: Insufficient documentation

## 2018-06-07 DIAGNOSIS — Z823 Family history of stroke: Secondary | ICD-10-CM | POA: Insufficient documentation

## 2018-06-07 DIAGNOSIS — Z8249 Family history of ischemic heart disease and other diseases of the circulatory system: Secondary | ICD-10-CM | POA: Insufficient documentation

## 2018-06-07 DIAGNOSIS — J45909 Unspecified asthma, uncomplicated: Secondary | ICD-10-CM | POA: Insufficient documentation

## 2018-06-07 DIAGNOSIS — K219 Gastro-esophageal reflux disease without esophagitis: Secondary | ICD-10-CM | POA: Diagnosis not present

## 2018-06-07 DIAGNOSIS — Z79899 Other long term (current) drug therapy: Secondary | ICD-10-CM | POA: Diagnosis not present

## 2018-06-07 DIAGNOSIS — Z171 Estrogen receptor negative status [ER-]: Secondary | ICD-10-CM | POA: Insufficient documentation

## 2018-06-07 DIAGNOSIS — Z95828 Presence of other vascular implants and grafts: Secondary | ICD-10-CM

## 2018-06-07 DIAGNOSIS — Z803 Family history of malignant neoplasm of breast: Secondary | ICD-10-CM | POA: Diagnosis not present

## 2018-06-07 DIAGNOSIS — Z853 Personal history of malignant neoplasm of breast: Secondary | ICD-10-CM

## 2018-06-07 DIAGNOSIS — Z8042 Family history of malignant neoplasm of prostate: Secondary | ICD-10-CM | POA: Diagnosis not present

## 2018-06-07 DIAGNOSIS — I10 Essential (primary) hypertension: Secondary | ICD-10-CM | POA: Insufficient documentation

## 2018-06-07 DIAGNOSIS — J449 Chronic obstructive pulmonary disease, unspecified: Secondary | ICD-10-CM | POA: Diagnosis not present

## 2018-06-07 DIAGNOSIS — Z419 Encounter for procedure for purposes other than remedying health state, unspecified: Secondary | ICD-10-CM

## 2018-06-07 DIAGNOSIS — C50912 Malignant neoplasm of unspecified site of left female breast: Secondary | ICD-10-CM | POA: Diagnosis present

## 2018-06-07 HISTORY — PX: BREAST LUMPECTOMY: SHX2

## 2018-06-07 HISTORY — PX: PORTACATH PLACEMENT: SHX2246

## 2018-06-07 HISTORY — DX: Malignant (primary) neoplasm, unspecified: C80.1

## 2018-06-07 HISTORY — PX: PARTIAL MASTECTOMY WITH AXILLARY SENTINEL LYMPH NODE BIOPSY: SHX6004

## 2018-06-07 LAB — CBC
HCT: 42.1 % (ref 36.0–46.0)
Hemoglobin: 12.9 g/dL (ref 12.0–15.0)
MCH: 28 pg (ref 26.0–34.0)
MCHC: 30.6 g/dL (ref 30.0–36.0)
MCV: 91.5 fL (ref 78.0–100.0)
PLATELETS: 225 10*3/uL (ref 150–400)
RBC: 4.6 MIL/uL (ref 3.87–5.11)
RDW: 12.6 % (ref 11.5–15.5)
WBC: 6.6 10*3/uL (ref 4.0–10.5)

## 2018-06-07 LAB — BASIC METABOLIC PANEL
ANION GAP: 8 (ref 5–15)
BUN: 18 mg/dL (ref 8–23)
CALCIUM: 10.4 mg/dL — AB (ref 8.9–10.3)
CO2: 27 mmol/L (ref 22–32)
Chloride: 107 mmol/L (ref 98–111)
Creatinine, Ser: 0.92 mg/dL (ref 0.44–1.00)
GLUCOSE: 98 mg/dL (ref 70–99)
POTASSIUM: 4 mmol/L (ref 3.5–5.1)
Sodium: 142 mmol/L (ref 135–145)

## 2018-06-07 SURGERY — PARTIAL MASTECTOMY WITH AXILLARY SENTINEL LYMPH NODE BIOPSY
Anesthesia: General | Site: Chest

## 2018-06-07 MED ORDER — OXYCODONE HCL 5 MG/5ML PO SOLN: 5.0000 mg | Freq: Once | ORAL | Status: DC | PRN

## 2018-06-07 MED ORDER — CELECOXIB 200 MG PO CAPS
ORAL_CAPSULE | ORAL | Status: AC
  Administered 2018-06-07: 200 mg via ORAL
  Filled 2018-06-07: qty 1

## 2018-06-07 MED ORDER — FENTANYL CITRATE (PF) 100 MCG/2ML IJ SOLN
INTRAMUSCULAR | Status: DC | PRN
  Administered 2018-06-07 (×2): 25 ug via INTRAVENOUS

## 2018-06-07 MED ORDER — METHYLENE BLUE 0.5 % INJ SOLN
INTRAVENOUS | Status: AC
  Filled 2018-06-07: qty 10

## 2018-06-07 MED ORDER — EPHEDRINE SULFATE 50 MG/ML IJ SOLN
INTRAMUSCULAR | Status: DC | PRN
  Administered 2018-06-07: 5 mg via INTRAVENOUS
  Administered 2018-06-07: 10 mg via INTRAVENOUS

## 2018-06-07 MED ORDER — FENTANYL CITRATE (PF) 100 MCG/2ML IJ SOLN: 25.0000 ug | INTRAMUSCULAR | Status: DC | PRN

## 2018-06-07 MED ORDER — ONDANSETRON HCL 4 MG/2ML IJ SOLN: 4.0000 mg | Freq: Once | INTRAMUSCULAR | Status: DC | PRN

## 2018-06-07 MED ORDER — MIDAZOLAM HCL 2 MG/2ML IJ SOLN
INTRAMUSCULAR | Status: AC
  Filled 2018-06-07: qty 2

## 2018-06-07 MED ORDER — MEPERIDINE HCL 50 MG/ML IJ SOLN: 6.2500 mg | INTRAMUSCULAR | Status: DC | PRN

## 2018-06-07 MED ORDER — TECHNETIUM TC 99M SULFUR COLLOID FILTERED
1.0000 | Freq: Once | INTRAVENOUS | Status: AC | PRN
  Administered 2018-06-07: 1 via INTRADERMAL

## 2018-06-07 MED ORDER — LACTATED RINGERS IV SOLN
Freq: Once | INTRAVENOUS | Status: AC
  Administered 2018-06-07: 10:00:00 via INTRAVENOUS

## 2018-06-07 MED ORDER — BUPIVACAINE HCL (PF) 0.25 % IJ SOLN
INTRAMUSCULAR | Status: DC | PRN
  Administered 2018-06-07: 21.5 mL

## 2018-06-07 MED ORDER — FENTANYL CITRATE (PF) 250 MCG/5ML IJ SOLN
INTRAMUSCULAR | Status: AC
  Filled 2018-06-07: qty 5

## 2018-06-07 MED ORDER — CHLORHEXIDINE GLUCONATE CLOTH 2 % EX PADS: 6.0000 | MEDICATED_PAD | Freq: Once | CUTANEOUS | Status: DC

## 2018-06-07 MED ORDER — BUPIVACAINE HCL (PF) 0.75 % IJ SOLN: INTRAMUSCULAR | Status: DC | PRN

## 2018-06-07 MED ORDER — OXYCODONE HCL 5 MG PO TABS: 5.0000 mg | ORAL_TABLET | Freq: Once | ORAL | Status: DC | PRN

## 2018-06-07 MED ORDER — PROPOFOL 10 MG/ML IV BOLUS
INTRAVENOUS | Status: DC | PRN
  Administered 2018-06-07: 150 mg via INTRAVENOUS

## 2018-06-07 MED ORDER — FENTANYL CITRATE (PF) 100 MCG/2ML IJ SOLN
INTRAMUSCULAR | Status: AC
  Administered 2018-06-07: 50 ug via INTRAVENOUS
  Filled 2018-06-07: qty 2

## 2018-06-07 MED ORDER — 0.9 % SODIUM CHLORIDE (POUR BTL) OPTIME
TOPICAL | Status: DC | PRN
  Administered 2018-06-07: 1000 mL

## 2018-06-07 MED ORDER — ACETAMINOPHEN 500 MG PO TABS
ORAL_TABLET | ORAL | Status: AC
  Administered 2018-06-07: 1000 mg via ORAL
  Filled 2018-06-07: qty 2

## 2018-06-07 MED ORDER — SODIUM CHLORIDE 0.9 % IV SOLN
INTRAVENOUS | Status: DC | PRN
  Administered 2018-06-07: 50 ug/min via INTRAVENOUS

## 2018-06-07 MED ORDER — BUPIVACAINE HCL (PF) 0.25 % IJ SOLN
INTRAMUSCULAR | Status: AC
  Filled 2018-06-07: qty 30

## 2018-06-07 MED ORDER — LIDOCAINE 2% (20 MG/ML) 5 ML SYRINGE
INTRAMUSCULAR | Status: DC | PRN
  Administered 2018-06-07: 60 mg via INTRAVENOUS

## 2018-06-07 MED ORDER — ACETAMINOPHEN 325 MG PO TABS: 325.0000 mg | ORAL_TABLET | ORAL | Status: DC | PRN

## 2018-06-07 MED ORDER — PHENYLEPHRINE HCL 10 MG/ML IJ SOLN
INTRAMUSCULAR | Status: DC | PRN
  Administered 2018-06-07: 80 ug via INTRAVENOUS
  Administered 2018-06-07: 120 ug via INTRAVENOUS
  Administered 2018-06-07: 80 ug via INTRAVENOUS

## 2018-06-07 MED ORDER — GABAPENTIN 300 MG PO CAPS
ORAL_CAPSULE | ORAL | Status: AC
  Administered 2018-06-07: 300 mg via ORAL
  Filled 2018-06-07: qty 1

## 2018-06-07 MED ORDER — LACTATED RINGERS IV SOLN
INTRAVENOUS | Status: DC | PRN
  Administered 2018-06-07: 10:00:00 via INTRAVENOUS

## 2018-06-07 MED ORDER — GABAPENTIN 300 MG PO CAPS
300.0000 mg | ORAL_CAPSULE | ORAL | Status: AC
  Administered 2018-06-07: 300 mg via ORAL

## 2018-06-07 MED ORDER — MIDAZOLAM HCL 2 MG/2ML IJ SOLN
INTRAMUSCULAR | Status: AC
  Administered 2018-06-07: 1 mg via INTRAVENOUS
  Filled 2018-06-07: qty 2

## 2018-06-07 MED ORDER — MIDAZOLAM HCL 2 MG/2ML IJ SOLN
1.0000 mg | Freq: Once | INTRAMUSCULAR | Status: AC
  Administered 2018-06-07: 1 mg via INTRAVENOUS

## 2018-06-07 MED ORDER — SODIUM CHLORIDE 0.9 % IJ SOLN
INTRAMUSCULAR | Status: AC
  Filled 2018-06-07: qty 10

## 2018-06-07 MED ORDER — OXYCODONE HCL 5 MG PO TABS: 5.0000 mg | ORAL_TABLET | Freq: Four times a day (QID) | ORAL | 0 refills | Status: DC | PRN

## 2018-06-07 MED ORDER — HEPARIN SOD (PORK) LOCK FLUSH 100 UNIT/ML IV SOLN
INTRAVENOUS | Status: AC
  Filled 2018-06-07: qty 5

## 2018-06-07 MED ORDER — HEPARIN SOD (PORK) LOCK FLUSH 100 UNIT/ML IV SOLN
INTRAVENOUS | Status: DC | PRN
  Administered 2018-06-07: 500 [IU] via INTRAVENOUS

## 2018-06-07 MED ORDER — PROPOFOL 10 MG/ML IV BOLUS
INTRAVENOUS | Status: AC
  Filled 2018-06-07: qty 20

## 2018-06-07 MED ORDER — MIDAZOLAM HCL 5 MG/5ML IJ SOLN: INTRAMUSCULAR | Status: DC | PRN

## 2018-06-07 MED ORDER — CEFAZOLIN SODIUM-DEXTROSE 2-4 GM/100ML-% IV SOLN
INTRAVENOUS | Status: AC
  Filled 2018-06-07: qty 100

## 2018-06-07 MED ORDER — CEFAZOLIN SODIUM-DEXTROSE 2-4 GM/100ML-% IV SOLN
2.0000 g | INTRAVENOUS | Status: AC
  Administered 2018-06-07: 2 g via INTRAVENOUS

## 2018-06-07 MED ORDER — SODIUM CHLORIDE 0.9 % IV SOLN
INTRAVENOUS | Status: DC | PRN
  Administered 2018-06-07: 11:00:00

## 2018-06-07 MED ORDER — CELECOXIB 200 MG PO CAPS
200.0000 mg | ORAL_CAPSULE | ORAL | Status: AC
  Administered 2018-06-07: 200 mg via ORAL

## 2018-06-07 MED ORDER — FENTANYL CITRATE (PF) 100 MCG/2ML IJ SOLN
50.0000 ug | Freq: Once | INTRAMUSCULAR | Status: AC
  Administered 2018-06-07: 50 ug via INTRAVENOUS

## 2018-06-07 MED ORDER — ACETAMINOPHEN 160 MG/5ML PO SOLN: 325.0000 mg | ORAL | Status: DC | PRN

## 2018-06-07 MED ORDER — ROPIVACAINE HCL 7.5 MG/ML IJ SOLN
INTRAMUSCULAR | Status: DC | PRN
  Administered 2018-06-07: 25 mL via PERINEURAL

## 2018-06-07 MED ORDER — SODIUM CHLORIDE 0.9 % IV SOLN
INTRAVENOUS | Status: AC
  Filled 2018-06-07: qty 1.2

## 2018-06-07 MED ORDER — ACETAMINOPHEN 500 MG PO TABS
1000.0000 mg | ORAL_TABLET | ORAL | Status: AC
  Administered 2018-06-07: 1000 mg via ORAL

## 2018-06-07 MED ORDER — DEXAMETHASONE SODIUM PHOSPHATE 10 MG/ML IJ SOLN
INTRAMUSCULAR | Status: DC | PRN
  Administered 2018-06-07: 10 mg via INTRAVENOUS

## 2018-06-07 SURGICAL SUPPLY — 65 items
APPLIER CLIP 9.375 MED OPEN (MISCELLANEOUS) ×3
BAG DECANTER FOR FLEXI CONT (MISCELLANEOUS) ×3 IMPLANT
BINDER BREAST LRG (GAUZE/BANDAGES/DRESSINGS) IMPLANT
BINDER BREAST MEDIUM (GAUZE/BANDAGES/DRESSINGS) ×3 IMPLANT
BINDER BREAST XLRG (GAUZE/BANDAGES/DRESSINGS) IMPLANT
CANISTER SUCT 3000ML PPV (MISCELLANEOUS) ×3 IMPLANT
CHLORAPREP W/TINT 26ML (MISCELLANEOUS) ×3 IMPLANT
CLIP APPLIE 9.375 MED OPEN (MISCELLANEOUS) ×2 IMPLANT
CONT SPEC 4OZ CLIKSEAL STRL BL (MISCELLANEOUS) ×3 IMPLANT
COVER PROBE W GEL 5X96 (DRAPES) ×3 IMPLANT
COVER SURGICAL LIGHT HANDLE (MISCELLANEOUS) ×3 IMPLANT
COVER TRANSDUCER ULTRASND GEL (DRAPE) IMPLANT
CRADLE DONUT ADULT HEAD (MISCELLANEOUS) ×3 IMPLANT
DECANTER SPIKE VIAL GLASS SM (MISCELLANEOUS) ×3 IMPLANT
DERMABOND ADVANCED (GAUZE/BANDAGES/DRESSINGS) ×1
DERMABOND ADVANCED .7 DNX12 (GAUZE/BANDAGES/DRESSINGS) ×2 IMPLANT
DRAPE C-ARM 42X72 X-RAY (DRAPES) ×3 IMPLANT
DRAPE CHEST BREAST 15X10 FENES (DRAPES) ×3 IMPLANT
DRAPE UTILITY XL STRL (DRAPES) ×6 IMPLANT
DRSG TEGADERM 4X4.75 (GAUZE/BANDAGES/DRESSINGS) ×3 IMPLANT
ELECT CAUTERY BLADE 6.4 (BLADE) ×3 IMPLANT
ELECT REM PT RETURN 9FT ADLT (ELECTROSURGICAL) ×3
ELECTRODE REM PT RTRN 9FT ADLT (ELECTROSURGICAL) ×2 IMPLANT
GAUZE 4X4 16PLY RFD (DISPOSABLE) ×3 IMPLANT
GAUZE SPONGE 2X2 8PLY STRL LF (GAUZE/BANDAGES/DRESSINGS) ×2 IMPLANT
GAUZE SPONGE 4X4 12PLY STRL (GAUZE/BANDAGES/DRESSINGS) ×3 IMPLANT
GEL ULTRASOUND 20GR AQUASONIC (MISCELLANEOUS) IMPLANT
GLOVE SURG SIGNA 7.5 PF LTX (GLOVE) ×6 IMPLANT
GOWN STRL REUS W/ TWL LRG LVL3 (GOWN DISPOSABLE) ×2 IMPLANT
GOWN STRL REUS W/ TWL XL LVL3 (GOWN DISPOSABLE) ×4 IMPLANT
GOWN STRL REUS W/TWL LRG LVL3 (GOWN DISPOSABLE) ×1
GOWN STRL REUS W/TWL XL LVL3 (GOWN DISPOSABLE) ×2
INTRODUCER COOK 11FR (CATHETERS) IMPLANT
KIT BASIN OR (CUSTOM PROCEDURE TRAY) ×3 IMPLANT
KIT MARKER MARGIN INK (KITS) ×3 IMPLANT
KIT PORT POWER 8FR ISP CVUE (Port) ×3 IMPLANT
KIT TURNOVER KIT B (KITS) ×3 IMPLANT
NEEDLE 18GX1X1/2 (RX/OR ONLY) (NEEDLE) IMPLANT
NEEDLE FILTER BLUNT 18X 1/2SAF (NEEDLE)
NEEDLE FILTER BLUNT 18X1 1/2 (NEEDLE) IMPLANT
NEEDLE HYPO 25GX1X1/2 BEV (NEEDLE) ×3 IMPLANT
NS IRRIG 1000ML POUR BTL (IV SOLUTION) ×3 IMPLANT
PACK GENERAL/GYN (CUSTOM PROCEDURE TRAY) ×3 IMPLANT
PAD ARMBOARD 7.5X6 YLW CONV (MISCELLANEOUS) ×3 IMPLANT
PENCIL BUTTON HOLSTER BLD 10FT (ELECTRODE) IMPLANT
PENCIL SMOKE EVACUATOR (MISCELLANEOUS) ×3 IMPLANT
SET INTRODUCER 12FR PACEMAKER (INTRODUCER) IMPLANT
SET SHEATH INTRODUCER 10FR (MISCELLANEOUS) IMPLANT
SHEATH COOK PEEL AWAY SET 9F (SHEATH) IMPLANT
SPONGE GAUZE 2X2 STER 10/PKG (GAUZE/BANDAGES/DRESSINGS) ×1
SPONGE LAP 4X18 RFD (DISPOSABLE) ×3 IMPLANT
SUT MNCRL AB 4-0 PS2 18 (SUTURE) ×6 IMPLANT
SUT PROLENE 2 0 SH 30 (SUTURE) ×6 IMPLANT
SUT SILK 2 0 (SUTURE)
SUT SILK 2-0 18XBRD TIE 12 (SUTURE) IMPLANT
SUT VIC AB 3-0 SH 18 (SUTURE) ×3 IMPLANT
SUT VIC AB 3-0 SH 27 (SUTURE)
SUT VIC AB 3-0 SH 27XBRD (SUTURE) IMPLANT
SYR 5ML LUER SLIP (SYRINGE) ×3 IMPLANT
SYR CONTROL 10ML LL (SYRINGE) ×3 IMPLANT
TOWEL OR 17X24 6PK STRL BLUE (TOWEL DISPOSABLE) ×3 IMPLANT
TOWEL OR 17X26 10 PK STRL BLUE (TOWEL DISPOSABLE) ×3 IMPLANT
TRAY LAPAROSCOPIC MC (CUSTOM PROCEDURE TRAY) IMPLANT
TUBE CONNECTING 12X1/4 (SUCTIONS) ×3 IMPLANT
YANKAUER SUCT BULB TIP NO VENT (SUCTIONS) ×3 IMPLANT

## 2018-06-07 NOTE — Op Note (Signed)
LEFT BREAST PARTIAL MASTECTOMY WITH AXILLARY SENTINEL LYMPH NODE BIOPSY, INSERTION PORT-Johns-CATH  Procedure Note  Jennifer Johns 06/07/2018   Pre-op Diagnosis: LEFT BREAST CANCER     Post-op Diagnosis: same  Procedure(s): LEFT BREAST PARTIAL MASTECTOMY WITH DEEP AXILLARY SENTINEL LYMPH NODE BIOPSY INSERTION PORT-Johns-CATH RIGHT SUBCLAVIAN VEIN (8 FR)  Surgeon(s): Jennifer Keens, MD  Anesthesia: General  Staff:  Circulator: Jennifer Johns, RN Radiology Technologist: Jennifer Johns Scrub Person: Jennifer Grice, RN; Jennifer Johns, CST  Estimated Blood Loss: Minimal               Specimens: sent to path  Indications: This is Johns 62 year old female with Johns triple negative left breast cancer.  The decision has been made to proceed with Johns partial mastectomy with sentinel node biopsy and Port-Johns-Cath insertion  Procedure: The patient was identified in the holding area and radioactive isotope was injected around the patient's nipple areolar complex by the radiation technologist.  She is also undergone Johns nerve block by physiology.  She was then taken to the operating room, placed upon the operating table, and general anesthesia was induced. Her chest, breast, axilla, and neck were then prepped and draped in usual sterile fashion.  The patient was placed in the Trendelenburg position.  I anesthetized skin of the right chest with Marcaine.  I then used the introducer needle to easily cannulate the right subclavian vein.  Johns wire was passed through the needle into the central venous system under direct fluoroscopy.  I made the incision large with Johns scalpel and then created Johns pocket for the port.  An 8 French [place] port was then brought to the field.  I placed the venous dilator introducer sheath easily over the wire and into the central venous system.  I then remove the wire and the dilator.  The catheter was attached to the port and was flushed.  The port easily fit into the pocket.  We  cut the catheter in appropriate length and then easily fed down the peel-away sheath.  The sheath was peeled away leaving the cath in the central venous system.  Fluoroscopy again confirmed placement in the superior vena cava.  I accessed the port and good flush and return were demonstrated.  I then instilled concentrated heparin solution into the port.  The port was inserted and placed to the chest wall 2 separate 3-0 Prolene sutures.  I then closed the subcutaneous tissue with interrupted 3-0 Vicryl sutures and closed the skin with Johns running 4-0 Monocryl.  Next, I anesthetized the skin over the palpable left breast mass at the 12 o'clock position with Marcaine.  I performed an elliptical incision removing ellipse of skin over the top of the mass.  I then performed Johns wide partial mastectomy including all of the 12 o'clock position of the left breast going all the way down to the chest wall trying to stay widely around the palpable mass.  Once the mass was excised, I marked all margins with paint.  The specimen was then sent to pathology for evaluation.  The neoprobe was brought to the field.  Identified an area of increased uptake in the left axilla.  I anesthetized the skin with Marcaine and made an incision with Johns scalpel.  I then dissected down into the deep axillary tissue.  There were 4 or 5 multiple lymph nodes right together which I grasped with an Allis clamp.  All had radioactive isotope uptake.  I remove these in their entirety  together with the electrocautery.  Another separate lymph node was removed as well and sent to pathology for evaluation.  No other lymph nodes were identified.  At this point I irrigated the wound with saline.  I injected further Marcaine into the incisions.  I then placed surgical clips around the margins of the partial mastectomy site.  I then closed both incisions with 3-0 Vicryl sutures and 4-0 Monocryl.  Dermabond was then applied.  The patient tolerated procedure well.  All  the counts were correct at the end of the procedure.  The patient was then extubated in the operating room and taken in Johns stable condition to the recovery room.          Jennifer Johns   Date: 06/07/2018  Time: 12:12 PM

## 2018-06-07 NOTE — Anesthesia Preprocedure Evaluation (Signed)
Anesthesia Evaluation  Patient identified by MRN, date of birth, ID band Patient awake    Reviewed: Allergy & Precautions, H&P , NPO status , Patient's Chart, lab work & pertinent test results, reviewed documented beta blocker date and time   Airway Mallampati: II  TM Distance: >3 FB Neck ROM: full    Dental no notable dental hx.    Pulmonary asthma , COPD, Current Smoker,    Pulmonary exam normal breath sounds clear to auscultation       Cardiovascular Exercise Tolerance: Good hypertension, Pt. on medications  Rhythm:regular Rate:Normal     Neuro/Psych negative neurological ROS  negative psych ROS   GI/Hepatic negative GI ROS, Neg liver ROS,   Endo/Other  negative endocrine ROS  Renal/GU negative Renal ROS  negative genitourinary   Musculoskeletal   Abdominal   Peds  Hematology negative hematology ROS (+)   Anesthesia Other Findings   Reproductive/Obstetrics negative OB ROS                             Anesthesia Physical Anesthesia Plan  ASA: II  Anesthesia Plan: General   Post-op Pain Management: GA combined w/ Regional for post-op pain   Induction: Intravenous  PONV Risk Score and Plan: 3 and Ondansetron, Dexamethasone and Treatment may vary due to age or medical condition  Airway Management Planned: Oral ETT and LMA  Additional Equipment:   Intra-op Plan:   Post-operative Plan: Extubation in OR  Informed Consent: I have reviewed the patients History and Physical, chart, labs and discussed the procedure including the risks, benefits and alternatives for the proposed anesthesia with the patient or authorized representative who has indicated his/her understanding and acceptance.   Dental Advisory Given  Plan Discussed with: CRNA, Anesthesiologist and Surgeon  Anesthesia Plan Comments: (  )        Anesthesia Quick Evaluation

## 2018-06-07 NOTE — Anesthesia Postprocedure Evaluation (Signed)
Anesthesia Post Note  Patient: Jennifer Johns  Procedure(s) Performed: LEFT BREAST PARTIAL MASTECTOMY WITH AXILLARY SENTINEL LYMPH NODE BIOPSY (Left Breast) INSERTION PORT-A-CATH (N/A Chest)     Patient location during evaluation: PACU Anesthesia Type: General Level of consciousness: awake and alert Pain management: pain level controlled Vital Signs Assessment: post-procedure vital signs reviewed and stable Respiratory status: spontaneous breathing, nonlabored ventilation, respiratory function stable and patient connected to nasal cannula oxygen Cardiovascular status: blood pressure returned to baseline and stable Postop Assessment: no apparent nausea or vomiting Anesthetic complications: no    Last Vitals:  Vitals:   06/07/18 1045 06/07/18 1220  BP: (!) 158/81 115/75  Pulse: 85 77  Resp: 15 12  Temp:  36.5 C  SpO2: 100% 100%    Last Pain:  Vitals:   06/07/18 1220  TempSrc:   PainSc: 0-No pain                 Czar Ysaguirre

## 2018-06-07 NOTE — Interval H&P Note (Signed)
History and Physical Interval Note: no change in H and P  06/07/2018 10:29 AM  Jennifer Johns  has presented today for surgery, with the diagnosis of LEFT BREAST CANCER  The various methods of treatment have been discussed with the patient and family. After consideration of risks, benefits and other options for treatment, the patient has consented to  Procedure(s): LEFT BREAST PARTIAL MASTECTOMY WITH AXILLARY SENTINEL LYMPH NODE BIOPSY (Left) INSERTION PORT-A-CATH (N/A) as a surgical intervention .  The patient's history has been reviewed, patient examined, no change in status, stable for surgery.  I have reviewed the patient's chart and labs.  Questions were answered to the patient's satisfaction.     [name]

## 2018-06-07 NOTE — Anesthesia Procedure Notes (Addendum)
Anesthesia Regional Block: Pectoralis block   Pre-Anesthetic Checklist: ,, timeout performed, Correct Patient, Correct Site, Correct Laterality, Correct Procedure, Correct Position, site marked, Risks and benefits discussed,  Surgical consent,  Pre-op evaluation,  At surgeon's request and post-op pain management  Laterality: Left  Prep: chloraprep       Needles:  Injection technique: Single-shot  Needle Type: Echogenic Stimulator Needle     Needle Length: 5cm  Needle Gauge: 22     Additional Needles:   Procedures:, nerve stimulator,,, ultrasound used (permanent image in chart),,,,  Narrative:  Start time: 06/07/2018 10:45 AM End time: 06/07/2018 10:50 AM Injection made incrementally with aspirations every 5 mL.  Performed by: Personally  Anesthesiologist: Janeece Riggers, MD  Additional Notes: Functioning IV was confirmed and monitors were applied.  A 33mm 22ga Arrow echogenic stimulator needle was used. Sterile prep and drape,hand hygiene and sterile gloves were used. Ultrasound guidance: relevant anatomy identified, needle position confirmed, local anesthetic spread visualized around nerve(s)., vascular puncture avoided.  Image printed for medical record. Negative aspiration and negative test dose prior to incremental administration of local anesthetic. The patient tolerated the procedure well.

## 2018-06-07 NOTE — Transfer of Care (Signed)
Immediate Anesthesia Transfer of Care Note  Patient: Jennifer Johns  Procedure(s) Performed: LEFT BREAST PARTIAL MASTECTOMY WITH AXILLARY SENTINEL LYMPH NODE BIOPSY (Left Breast) INSERTION PORT-A-CATH (N/A Chest)  Patient Location: PACU  Anesthesia Type:General  Level of Consciousness: awake, alert  and oriented  Airway & Oxygen Therapy: Patient Spontanous Breathing and Patient connected to nasal cannula oxygen  Post-op Assessment: Report given to RN, Post -op Vital signs reviewed and stable and Patient moving all extremities X 4  Post vital signs: Reviewed and stable  Last Vitals:  Vitals Value Taken Time  BP 115/75 06/07/2018 12:20 PM  Temp    Pulse 84 06/07/2018 12:23 PM  Resp 12 06/07/2018 12:23 PM  SpO2 100 % 06/07/2018 12:23 PM  Vitals shown include unvalidated device data.  Last Pain:  Vitals:   06/07/18 1009  TempSrc: Oral  PainSc:       Patients Stated Pain Goal: 2 (47/65/46 [id number])  Complications: No apparent anesthesia complications

## 2018-06-07 NOTE — Anesthesia Procedure Notes (Signed)
Procedure Name: LMA Insertion Date/Time: 06/07/2018 11:02 AM Performed by: Neldon Newport, CRNA Pre-anesthesia Checklist: Timeout performed, Patient being monitored, Suction available, Emergency Drugs available and Patient identified Patient Re-evaluated:Patient Re-evaluated prior to induction Oxygen Delivery Method: Circle system utilized Preoxygenation: Pre-oxygenation with 100% oxygen Induction Type: IV induction Ventilation: Mask ventilation without difficulty LMA: LMA inserted LMA Size: 4.0 Tube type: Oral Number of attempts: 1 Placement Confirmation: breath sounds checked- equal and bilateral and positive ETCO2 Tube secured with: Tape Dental Injury: Teeth and Oropharynx as per pre-operative assessment

## 2018-06-07 NOTE — Discharge Instructions (Signed)
[place] Office Phone Number [phone]  BREAST BIOPSY/ PARTIAL MASTECTOMY: POST OP INSTRUCTIONS  Always review your discharge instruction sheet given to you by the facility where your surgery was performed.  IF YOU HAVE DISABILITY OR FAMILY LEAVE FORMS, YOU MUST BRING THEM TO THE OFFICE FOR PROCESSING.  DO NOT GIVE THEM TO YOUR DOCTOR.  1. A prescription for pain medication may be given to you upon discharge.  Take your pain medication as prescribed, if needed.  If narcotic pain medicine is not needed, then you may take acetaminophen (Tylenol) or ibuprofen (Advil) as needed. 2. Take your usually prescribed medications unless otherwise directed 3. If you need a refill on your pain medication, please contact your pharmacy.  They will contact our office to request authorization.  Prescriptions will not be filled after 5pm or on week-ends. 4. You should eat very light the first 24 hours after surgery, such as soup, crackers, pudding, etc.  Resume your normal diet the day after surgery. 5. Most patients will experience some swelling and bruising in the breast.  Ice packs and a good support bra will help.  Swelling and bruising can take several days to resolve.  6. It is common to experience some constipation if taking pain medication after surgery.  Increasing fluid intake and taking a stool softener will usually help or prevent this problem from occurring.  A mild laxative (Milk of Magnesia or Miralax) should be taken according to package directions if there are no bowel movements after 48 hours. 7. Unless discharge instructions indicate otherwise, you may remove your bandages 24-48 hours after surgery, and you may shower at that time.  You may have steri-strips (small skin tapes) in place directly over the incision.  These strips should be left on the skin for 7-10 days.  If your surgeon used skin glue on the incision, you may shower in 24 hours.  The glue will flake off over the  next 2-3 weeks.  Any sutures or staples will be removed at the office during your follow-up visit. 8. ACTIVITIES:  You may resume regular daily activities (gradually increasing) beginning the next day.  Wearing a good support bra or sports bra minimizes pain and swelling.  You may have sexual intercourse when it is comfortable. a. You may drive when you no longer are taking prescription pain medication, you can comfortably wear a seatbelt, and you can safely maneuver your car and apply brakes. b. RETURN TO WORK:  ______________________________________________________________________________________ 9. You should see your doctor in the office for a follow-up appointment approximately two weeks after your surgery.  Your doctors nurse will typically make your follow-up appointment when she calls you with your pathology report.  Expect your pathology report 2-3 business days after your surgery.  You may call to check if you do not hear from [place] after three days. 10. OTHER INSTRUCTIONS: __YOU MAY REMOVE THE BINDER TOMORROW AND MAY SHOWER 11. ICE PACK, TYLENOL, IBUPROFEN ALSO FOR PAIN 12. _____________________________________________________________________________________________ _____________________________________________________________________________________________________________________________________ _____________________________________________________________________________________________________________________________________ _____________________________________________________________________________________________________________________________________  WHEN TO CALL YOUR DOCTOR: 1. Fever over 101.0 2. Nausea and/or vomiting. 3. Extreme swelling or bruising. 4. Continued bleeding from incision. 5. Increased pain, redness, or drainage from the incision.  The clinic staff is available to answer your questions during regular business hours.  Please dont hesitate to call and ask to  speak to one of the nurses for clinical concerns.  If you have a medical emergency, go to the nearest emergency room or call 911.  A surgeon from  [facility] is always on call at the hospital.  For further questions, please visit [email or web address]

## 2018-06-08 ENCOUNTER — Encounter (HOSPITAL_COMMUNITY): Payer: Self-pay | Admitting: Surgery

## 2018-06-20 ENCOUNTER — Telehealth: Payer: Self-pay | Admitting: Nurse Practitioner

## 2018-06-20 NOTE — Telephone Encounter (Signed)
R./s appt per 10/1 sch message - pt daughter is aware of appt date and time.

## 2018-06-23 ENCOUNTER — Encounter: Payer: Self-pay | Admitting: *Deleted

## 2018-06-23 ENCOUNTER — Other Ambulatory Visit: Payer: Medicaid Other

## 2018-06-23 ENCOUNTER — Encounter: Payer: Self-pay | Admitting: Nurse Practitioner

## 2018-06-23 ENCOUNTER — Inpatient Hospital Stay: Payer: Medicaid Other | Attending: Nurse Practitioner | Admitting: Nurse Practitioner

## 2018-06-23 VITALS — BP 153/84 | HR 80 | Temp 98.5°F | Resp 18 | Ht 66.0 in | Wt 140.8 lb

## 2018-06-23 DIAGNOSIS — I10 Essential (primary) hypertension: Secondary | ICD-10-CM | POA: Diagnosis not present

## 2018-06-23 DIAGNOSIS — Z5189 Encounter for other specified aftercare: Secondary | ICD-10-CM | POA: Insufficient documentation

## 2018-06-23 DIAGNOSIS — Z23 Encounter for immunization: Secondary | ICD-10-CM | POA: Diagnosis not present

## 2018-06-23 DIAGNOSIS — E119 Type 2 diabetes mellitus without complications: Secondary | ICD-10-CM | POA: Diagnosis not present

## 2018-06-23 DIAGNOSIS — J449 Chronic obstructive pulmonary disease, unspecified: Secondary | ICD-10-CM

## 2018-06-23 DIAGNOSIS — C50112 Malignant neoplasm of central portion of left female breast: Secondary | ICD-10-CM

## 2018-06-23 DIAGNOSIS — Z171 Estrogen receptor negative status [ER-]: Secondary | ICD-10-CM | POA: Diagnosis not present

## 2018-06-23 DIAGNOSIS — K219 Gastro-esophageal reflux disease without esophagitis: Secondary | ICD-10-CM | POA: Insufficient documentation

## 2018-06-23 DIAGNOSIS — D493 Neoplasm of unspecified behavior of breast: Secondary | ICD-10-CM

## 2018-06-23 DIAGNOSIS — Z5111 Encounter for antineoplastic chemotherapy: Secondary | ICD-10-CM | POA: Diagnosis present

## 2018-06-23 NOTE — Progress Notes (Addendum)
[place]  [phone] [phone]) [phone]  Clinic Follow up Note   Patient Care Team: [facility] as PCP - General Coralie Keens, MD as Consulting Physician (General Surgery) Truitt Merle, MD as Consulting Physician (Hematology) 06/23/2018  SUMMARY OF ONCOLOGIC HISTORY:   Cancer of central portion of left female breast ([place])   04/28/2018 Cancer Staging    Staging form: Breast, AJCC 8th Edition - Clinical stage from 04/28/2018: Stage IB (cT1c, cN0, cM0, G3, ER-, PR-, HER2-) - Signed by Truitt Merle, MD on 05/24/2018    05/24/2018 Initial Diagnosis    Cancer of central portion of left female breast ([place])    06/02/2018 Cancer Staging    Staging form: Breast, AJCC 8th Edition - Pathologic stage from 06/02/2018: Stage IIA (pT2, pN0, cM0, G3, ER-, PR-, HER2-) - Signed by Truitt Merle, MD on 06/23/2018   CURRENT THERAPY: PENDING adjuvant chemo AC q2 weeks x4 cycles followed by taxol q2 weeks x4, possible addition of carboplatin with taxol  INTERVAL HISTORY: Ms. Garoutte returns with her family for discussion of surgical path and adjuvant treatment. She underwent left partial mastectomy with SLNB and PAC insertion on 9/18 per Dr. Ninfa Linden. She did not have surgical drains. She had no postop complications. She took pain medication once, denies pain today. Her activity and mobility are good. She has not seen surgeon yet. She denies fatigue. Appetite is low but normal for her. Denies recent fever, chills, cough, chest pain, dyspnea, n/v/c/d, or pain.    MEDICAL HISTORY:  Past Medical History:  Diagnosis Date  . Asthma   . Cancer ([place])    Left Breast  . COPD (chronic obstructive pulmonary disease) ([place])   . Hypertension     SURGICAL HISTORY: Past Surgical History:  Procedure Laterality Date  . COLONOSCOPY N/A 03/18/2016   Procedure: COLONOSCOPY;  Surgeon: Danie Binder, MD;  [facility];  Service: Endoscopy;  Laterality: N/A;   10:30 AM  . PARTIAL MASTECTOMY WITH AXILLARY SENTINEL LYMPH NODE BIOPSY Left 06/07/2018   Procedure: LEFT BREAST PARTIAL MASTECTOMY WITH AXILLARY SENTINEL LYMPH NODE BIOPSY;  Surgeon: Coralie Keens, MD;  Location: [facility];  Service: General;  Laterality: Left;  . PORTACATH PLACEMENT N/A 06/07/2018   Procedure: INSERTION PORT-A-CATH;  Surgeon: Coralie Keens, MD;  Location: [facility];  Service: General;  Laterality: N/A;    I have reviewed the social history and family history with the patient and they are unchanged from previous note.  ALLERGIES:  is allergic to carrot [daucus carota] and other.  MEDICATIONS:  Current Outpatient Medications  Medication Sig Dispense Refill  . albuterol (ACCUNEB) 1.25 MG/3ML nebulizer solution Take 1 ampule by nebulization every 6 (six) hours as needed for wheezing or shortness of breath.     Marland Kitchen albuterol (PROVENTIL HFA;VENTOLIN HFA) 108 (90 Base) MCG/ACT inhaler Inhale 2 puffs into the lungs every 6 (six) hours as needed for wheezing or shortness of breath.     . hydrochlorothiazide (MICROZIDE) 12.5 MG capsule Take 12.5 mg by mouth daily.    Marland Kitchen lisinopril (PRINIVIL,ZESTRIL) 10 MG tablet Take 10 mg by mouth daily.    . ranitidine (ZANTAC) 150 MG tablet Take 150 mg by mouth daily.   1  . cholecalciferol (VITAMIN D) 1000 units tablet Take 1,000 Units by mouth daily.   1  . oxyCODONE (OXY IR/ROXICODONE) 5 MG immediate release tablet Take 1 tablet (5 mg total) by mouth every 6 (six) hours as needed for moderate pain  or severe pain. 30 tablet 0   No current facility-administered medications for this visit.     PHYSICAL EXAMINATION: ECOG PERFORMANCE STATUS: 0 - Asymptomatic  Vitals:   06/23/18 1338  BP: (!) 153/84  Pulse: 80  Resp: 18  Temp: 98.5 F (36.9 C)  SpO2: 100%   Filed Weights   06/23/18 1338  Weight: 140 lb 12.8 oz (63.9 kg)    GENERAL:alert, no distress and comfortable SKIN: no rashes or significant lesions EYES: scleral erythema in right  eye OROPHARYNX:no thrush or ulcers LYMPH:  no palpable cervical or supraclavicular lymphadenopathy  LUNGS: distant breath sounds; normal breathing effort HEART: regular rate & rhythm, no lower extremity edema ABDOMEN:abdomen soft, non-tender and normal bowel sounds Musculoskeletal:no cyanosis of digits and no clubbing  NEURO: alert & oriented x 3 with fluent speech, no focal motor/sensory deficits BREAST EXAM: s/p left partial mastectomy and SLNB, breast and axillary incisions are closed, healing well without erythema or drainage. No palpable mass in left breast or axilla.  PAC healing well, no erythema   LABORATORY DATA:  I have reviewed the data as listed CBC Latest Ref Rng & Units 06/07/2018  WBC 4.0 - 10.5 K/uL 6.6  Hemoglobin 12.0 - 15.0 g/dL 12.9  Hematocrit 36.0 - 46.0 % 42.1  Platelets 150 - 400 K/uL 225     CMP Latest Ref Rng & Units 06/07/2018  Glucose 70 - 99 mg/dL 98  BUN 8 - 23 mg/dL 18  Creatinine 0.44 - 1.00 mg/dL 0.92  Sodium 135 - 145 mmol/L 142  Potassium 3.5 - 5.1 mmol/L 4.0  Chloride 98 - 111 mmol/L 107  CO2 22 - 32 mmol/L 27  Calcium 8.9 - 10.3 mg/dL 10.4(H)    Surgical path 06/07/18   1. PROGNOSTIC INDICATORS Results: IMMUNOHISTOCHEMICAL AND MORPHOMETRIC ANALYSIS PERFORMED MANUALLY The tumor cells are Negative for Her2 (0). Estrogen Receptor: 0%, NEGATIVE Progesterone Receptor: 0%, NEGATIVE Proliferation Marker Ki67: 40% COMMENT: The negative hormone receptor study(ies) in this case has An internal positive control. REFERENCE RANGE ESTROGEN RECEPTOR NEGATIVE 0% POSITIVE =>1% REFERENCE RANGE PROGESTERONE RECEPTOR NEGATIVE 0% POSITIVE =>1% All controls stained appropriately Enid Cutter MD Pathologist, Electronic Signature ( Signed 06/09/2018) FINAL DIAGNOSIS Diagnosis 1. Breast, partial mastectomy, Left - INVASIVE DUCTAL CARCINOMA, GRADE III, 2.1 CM 1 of 4 FINAL for ROSEY, EIDE J [phone]) Diagnosis(continued) - SURGICAL RESECTION  MARGINS ARE NEGATIVE FOR CARCINOMA. - NEGATIVE FOR LYMPHOVASCULAR OR PERINEURAL INVASION. - BIOPSY SITE CHANGES. - SEE ONCOLOGY TABLE. - SEE NOTE. 2. Lymph node, sentinel, biopsy, Left Axillary - LYMPH NODE, NEGATIVE FOR CARCINOMA (0/1). 3. Lymph node, sentinel, biopsy, Left - LYMPH NODE, NEGATIVE FOR CARCINOMA (0/1). 4. Lymph node, sentinel, biopsy, Left - LYMPH NODE, NEGATIVE FOR CARCINOMA (0/1). 5. Lymph node, sentinel, biopsy, Left - LYMPH NODE, NEGATIVE FOR CARCINOMA (0/1). 6. Lymph node, sentinel, biopsy, Left - LYMPH NODE, NEGATIVE FOR CARCINOMA (0/1). 7. Lymph node, sentinel, biopsy, Left - LYMPH NODE, NEGATIVE FOR CARCINOMA (0/1). Microscopic Comment 1. INVASIVE CARCINOMA OF THE BREAST: Resection Procedure: Partial mastectomy Specimen Laterality: Left Tumor Size: 2.1 cm Histologic Type: Invasive ductal carcinoma Histologic Grade: Glandular (Acinar)/Tubular Differentiation 3 Nuclear Pleomorphism: 3 Mitotic Rate: 3 Overall Grade: III Ductal Carcinoma In Situ: Not identified Tumor Extension (required only if the structures are present and involved) (select all that apply): Not applicable Margins: Distance from closest margin (millimeters): Greater than 10 mm DCIS Margins: Not applicable. Regional Lymph Nodes: Number of Lymph Nodes Examined: 6 Number of Sentinel Nodes Examined (if applicable): 6  Number of Lymph Nodes with Macrometastases (>2 mm): 0 Number of Lymph Nodes with Micrometastases: 0 Number of Lymph Nodes with Isolated Tumor Cells (?0.2 mm or ?200 cells)#: 0 Size of Largest Metastatic Deposit (millimeters): Not applicable Extranodal Extension: Not applicable Treatment Effect: No known presurgical therapy Breast Biomarker Testing Performed on Previous Biopsy: No Representative tumor block: 1A Pathologic Stage Classification (pTNM, AJCC 8th Edition): pT2, pN0 (NK:ecj 06/08/2018) (v4.2.0.0)   RADIOGRAPHIC STUDIES: I have personally reviewed the  radiological images as listed and agreed with the findings in the report. No results found.   ASSESSMENT & PLAN: CARL BLEECKER is a 62 y.o. lovely female with a history of HTN, COPD, DM , GERD and asthma   1.  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 Ninfa Linden on 06/07/18 -Path shows invasive ductal carcinoma, Grade 3, spanning 2.1 cm, LN negative. Margins negative, no LV or PN invasion. ER/PR/HER2 negative - pT2N0, stage IIA; Dr. Burr Medico reviewed final path with the patient and family today -She is recovering very well from surgery -Dr. Burr Medico previously discussed adjuvant chemotherapy regimens to reduce risk of recurrence, including AC-T vs TC. She recommends AC q2 weeks x4 cycles followed by taxol q1-2 weeks x2-3 months, if she tolerates well. She is eligible and interested in UPBEAT study and adjuvant NRG BR003 that if randomized to investigational arm would receive carbo in addition to standard taxol after [facility]. Dr. Burr Medico discussed with research today. She will meet with them next week.  -Due to her T2 triple negative disease, Dr. Burr Medico recommends CT CAP and bone scan for staging to r/o distant metastasis.  -Chemotherapy consent: Side effects including but not limited to fatigue, nausea, vomiting, diarrhea, hair loss, neuropathy, fluid retention, renal and kidney dysfunction, neutropenic fever, need for blood transfusion, bleeding, and potentially reversible heart failure from adriamycin were discussed with patient in great detail. She agrees to proceed. -We discussed the need for Neulasta with [facility] and reviewed potential side effects, she agrees.  -will obtain baseline echo for adriamycin -She has had PAC placed, tolerated well - will arrange for her to attend chemo class,  echo, staging work up, and see research next week in anticipation to start chemo in 2 weeks; we will see her back before first cycle.   2. COPD, HTN -She will continue her medication, and follow-up with primary care physician.  3.  Smoking cessation -she reports she does not smoke  Plan -Final path reviewed, recommending adjuvant chemo AC-T -CT/bone scan for staging work up, chemo class, echo next week prior to beginning chemotherapy  -Research nurse visit next week  -lab, flush, f/u Dr. Burr Medico or me in 2 weeks with cycle 1 AC.     Orders Placed This Encounter  Procedures  . CT Chest W Contrast    Standing Status:   Future    Standing Expiration Date:   06/23/2019    Order Specific Question:   If indicated for the ordered procedure, I authorize the administration of contrast media per Radiology protocol    Answer:   Yes    Order Specific Question:   Preferred imaging location?    Answer:   [facility]    Order Specific Question:   Radiology Contrast Protocol - do NOT remove file path  Answer:   \\[email or web address].pdf  . CT Abdomen Pelvis W Contrast    Standing Status:   Future    Standing Expiration Date:   06/23/2019    Order Specific Question:   If indicated for the ordered procedure, I authorize the administration of contrast media per Radiology protocol    Answer:   Yes    Order Specific Question:   Preferred imaging location?    Answer:   [facility]    Order Specific Question:   Is Oral Contrast requested for this exam?    Answer:   Yes, Per Radiology protocol    Order Specific Question:   Radiology Contrast Protocol - do NOT remove file path    Answer:   \\[email or web address].pdf  . NM Bone Scan Whole Body    Standing Status:   Future    Standing Expiration Date:   06/23/2019    Order Specific Question:   If indicated for the ordered procedure, I authorize the administration of a radiopharmaceutical per  Radiology protocol    Answer:   Yes    Order Specific Question:   Preferred imaging location?    Answer:   [facility]    Order Specific Question:   Radiology Contrast Protocol - do NOT remove file path    Answer:   \\[email or web address]\Radiant\[email or web address].pdf  . CBC with Differential ([place] Only)    Standing Status:   Standing    Number of Occurrences:   20    Standing Expiration Date:   06/24/2019  . CMP ([place] only)    Standing Status:   Standing    Number of Occurrences:   20    Standing Expiration Date:   06/24/2019  . ECHOCARDIOGRAM COMPLETE    Standing Status:   Future    Standing Expiration Date:   09/24/2019    Order Specific Question:   Where should this test be performed    Answer:   [place]    Order Specific Question:   Perflutren DEFINITY (image enhancing agent) should be administered unless hypersensitivity or allergy exist    Answer:   Administer Perflutren   All questions were answered. The patient knows to call the clinic with any problems, questions or concerns. No barriers to learning was detected.    Alla Feeling, NP 06/23/18   Addendum  I have seen the patient, examined her. I agree with the assessment and and plan and have edited the notes.   Ms Tenorio is recovering well from her breast surgery.  I reviewed her surgical pathology findings with her and her family members in detail, it showed a 2.1 cm invasive ductal carcinoma, grade 3, triple negative, with high Ki-67.  All sentinel lymph nodes were negative.  Margins were negative.  She has stage IIa disease, given the aggressive nature of triple negative breast cancer, high risk of recurrence after surgery, I recommend adjuvant chemotherapy with dose dense Adriamycin and Cytoxan, every 2 weeks for 4 cycles, followed by Taxol weekly for 12 weeks or every 2 weeks for 4 cycles.  Benefits and potential side effects of chemotherapy discussed with her, she agrees to proceed.  The goal of  therapy is curative.  Alternative chemo regiment such as Cytoxan and docetaxel, will also discussed with her.    I also discussed the clinical trial option of NRG BR003, which is a phase 3, randomized, stage II or III triple negative breast cancer patients who receives adjuvant chemotherapy AC-T, with or without carboplatin.  She is interested, will meet our research nurse.  She will also be screened for the UPBEAT study.    We will obtain staging scan, and echocardiogram before chemotherapy. Chemo class will be scheduled.   All questions were answered.  I spent a total of 40 minutes for her visit today, more than 50% of face-to-face counseling.  Truitt Merle 06/24/2018

## 2018-06-23 NOTE — Patient Instructions (Signed)

## 2018-06-24 ENCOUNTER — Other Ambulatory Visit: Payer: Self-pay | Admitting: Hematology

## 2018-06-26 ENCOUNTER — Telehealth: Payer: Self-pay | Admitting: Hematology

## 2018-06-26 ENCOUNTER — Telehealth: Payer: Self-pay

## 2018-06-26 NOTE — Telephone Encounter (Signed)
LVM for patient about changes made to upcoming appointment, CT scan has been moved but Bone scan is still the same, also advised she can p/u contrast tomorrow at her appointment, left my number in case she had any questions

## 2018-06-26 NOTE — Telephone Encounter (Signed)
Left a detailed message concerning patient upcoming appointment. Requested that patient stop by scheduling after tomorrow chemo educ. To pick up already scheduled appointment. Per 10/4 follow up

## 2018-06-27 ENCOUNTER — Other Ambulatory Visit: Payer: Self-pay | Admitting: Hematology

## 2018-06-27 ENCOUNTER — Inpatient Hospital Stay: Payer: Medicaid Other

## 2018-06-27 DIAGNOSIS — C50112 Malignant neoplasm of central portion of left female breast: Secondary | ICD-10-CM

## 2018-06-27 DIAGNOSIS — Z171 Estrogen receptor negative status [ER-]: Principal | ICD-10-CM

## 2018-06-27 MED ORDER — LIDOCAINE-PRILOCAINE 2.5-2.5 % EX CREA: TOPICAL_CREAM | CUTANEOUS | 3 refills | Status: DC

## 2018-06-27 MED ORDER — PROCHLORPERAZINE MALEATE 10 MG PO TABS: 10.0000 mg | ORAL_TABLET | Freq: Four times a day (QID) | ORAL | 1 refills | Status: DC | PRN

## 2018-06-27 MED ORDER — ONDANSETRON HCL 8 MG PO TABS: 8.0000 mg | ORAL_TABLET | Freq: Two times a day (BID) | ORAL | 1 refills | Status: DC | PRN

## 2018-06-27 NOTE — Progress Notes (Signed)
START ON PATHWAY REGIMEN - Breast   Dose-Dense AC q14 days:   A cycle is every 14 days:     Doxorubicin      Cyclophosphamide      [id number]   **Always confirm dose/schedule in your pharmacy ordering system**  Paclitaxel 80 mg/m2 Weekly:   Administer weekly:     Paclitaxel   **Always confirm dose/schedule in your pharmacy ordering system**  Patient Characteristics: Postoperative without Neoadjuvant Therapy (Pathologic Staging), Invasive Disease, Adjuvant Therapy, HER2 Negative/Unknown/Equivocal, ER Negative/Unknown, Node Negative, pT1a-c, pN0/N1mi or pT2 or Higher, pN0 Therapeutic Status: Postoperative without Neoadjuvant Therapy (Pathologic Staging) AJCC Grade: G3 AJCC N Category: pN0 AJCC M Category: cM0 ER Status: Negative (-) AJCC 8 Stage Grouping: IIA HER2 Status: Negative (-) Oncotype Dx Recurrence Score: Not Appropriate AJCC T Category: pT2 PR Status: Negative (-) Intent of Therapy: Curative Intent, Discussed with Patient 

## 2018-06-28 ENCOUNTER — Telehealth: Payer: Self-pay | Admitting: Hematology

## 2018-06-28 NOTE — Telephone Encounter (Signed)
Called regarding 10/24

## 2018-06-29 ENCOUNTER — Telehealth: Payer: Self-pay | Admitting: Nurse Practitioner

## 2018-06-30 ENCOUNTER — Ambulatory Visit (HOSPITAL_COMMUNITY)
Admission: RE | Admit: 2018-06-30 | Discharge: 2018-06-30 | Disposition: A | Discharge status: Home / Self Care | Payer: Medicaid Other | Source: Ambulatory Visit | Attending: Nurse Practitioner | Admitting: Nurse Practitioner

## 2018-06-30 ENCOUNTER — Telehealth: Payer: Self-pay

## 2018-06-30 ENCOUNTER — Ambulatory Visit (HOSPITAL_COMMUNITY): Payer: Medicaid Other

## 2018-06-30 ENCOUNTER — Other Ambulatory Visit: Payer: Medicaid Other

## 2018-06-30 ENCOUNTER — Ambulatory Visit: Payer: Medicaid Other | Admitting: Nurse Practitioner

## 2018-06-30 DIAGNOSIS — J449 Chronic obstructive pulmonary disease, unspecified: Secondary | ICD-10-CM | POA: Diagnosis not present

## 2018-06-30 DIAGNOSIS — D493 Neoplasm of unspecified behavior of breast: Secondary | ICD-10-CM | POA: Diagnosis present

## 2018-06-30 DIAGNOSIS — I10 Essential (primary) hypertension: Secondary | ICD-10-CM | POA: Insufficient documentation

## 2018-06-30 NOTE — Telephone Encounter (Signed)
Per 10/11 walk in. Patient daughter came in to pick up a copy of Ms. Manseau schedule with added appointments.

## 2018-06-30 NOTE — Progress Notes (Signed)
  Echocardiogram 2D Echocardiogram has been performed.  Jennifer Johns 06/30/2018, 11:54 AM

## 2018-07-06 ENCOUNTER — Ambulatory Visit (HOSPITAL_COMMUNITY): Payer: Medicaid Other

## 2018-07-07 ENCOUNTER — Ambulatory Visit: Payer: Medicaid Other

## 2018-07-07 ENCOUNTER — Other Ambulatory Visit: Payer: Medicaid Other

## 2018-07-07 ENCOUNTER — Ambulatory Visit: Payer: Medicaid Other | Admitting: Nurse Practitioner

## 2018-07-11 ENCOUNTER — Encounter (HOSPITAL_COMMUNITY)
Admission: RE | Admit: 2018-07-11 | Discharge: 2018-07-11 | Disposition: A | Discharge status: Home / Self Care | Payer: Medicaid Other | Source: Ambulatory Visit | Attending: Nurse Practitioner | Admitting: Nurse Practitioner

## 2018-07-11 ENCOUNTER — Ambulatory Visit (HOSPITAL_COMMUNITY): Payer: Medicaid Other

## 2018-07-11 DIAGNOSIS — D493 Neoplasm of unspecified behavior of breast: Secondary | ICD-10-CM | POA: Diagnosis present

## 2018-07-11 MED ORDER — TECHNETIUM TC 99M MEDRONATE IV KIT
21.0000 | PACK | Freq: Once | INTRAVENOUS | Status: AC | PRN
  Administered 2018-07-11: 21 via INTRAVENOUS

## 2018-07-13 ENCOUNTER — Inpatient Hospital Stay: Payer: Medicaid Other

## 2018-07-13 ENCOUNTER — Encounter: Payer: Self-pay | Admitting: Hematology

## 2018-07-13 ENCOUNTER — Inpatient Hospital Stay (HOSPITAL_BASED_OUTPATIENT_CLINIC_OR_DEPARTMENT_OTHER): Payer: Medicaid Other | Admitting: Nurse Practitioner

## 2018-07-13 ENCOUNTER — Encounter: Payer: Self-pay | Admitting: Nurse Practitioner

## 2018-07-13 VITALS — BP 136/75 | HR 74 | Temp 99.0°F | Resp 17 | Ht 66.0 in | Wt 142.9 lb

## 2018-07-13 DIAGNOSIS — D493 Neoplasm of unspecified behavior of breast: Secondary | ICD-10-CM

## 2018-07-13 DIAGNOSIS — E119 Type 2 diabetes mellitus without complications: Secondary | ICD-10-CM

## 2018-07-13 DIAGNOSIS — C50112 Malignant neoplasm of central portion of left female breast: Secondary | ICD-10-CM

## 2018-07-13 DIAGNOSIS — Z171 Estrogen receptor negative status [ER-]: Secondary | ICD-10-CM | POA: Diagnosis not present

## 2018-07-13 DIAGNOSIS — Z23 Encounter for immunization: Secondary | ICD-10-CM

## 2018-07-13 DIAGNOSIS — J449 Chronic obstructive pulmonary disease, unspecified: Secondary | ICD-10-CM

## 2018-07-13 DIAGNOSIS — Z5111 Encounter for antineoplastic chemotherapy: Secondary | ICD-10-CM | POA: Diagnosis not present

## 2018-07-13 DIAGNOSIS — I10 Essential (primary) hypertension: Secondary | ICD-10-CM

## 2018-07-13 DIAGNOSIS — K219 Gastro-esophageal reflux disease without esophagitis: Secondary | ICD-10-CM

## 2018-07-13 DIAGNOSIS — Z95828 Presence of other vascular implants and grafts: Secondary | ICD-10-CM | POA: Insufficient documentation

## 2018-07-13 LAB — CBC WITH DIFFERENTIAL (CANCER CENTER ONLY)
Abs Immature Granulocytes: 0.01 10*3/uL (ref 0.00–0.07)
Basophils Absolute: 0 10*3/uL (ref 0.0–0.1)
Basophils Relative: 0 %
EOS ABS: 0.1 10*3/uL (ref 0.0–0.5)
EOS PCT: 1 %
HCT: 37.2 % (ref 36.0–46.0)
Hemoglobin: 11.6 g/dL — ABNORMAL LOW (ref 12.0–15.0)
IMMATURE GRANULOCYTES: 0 %
LYMPHS ABS: 2 10*3/uL (ref 0.7–4.0)
LYMPHS PCT: 28 %
MCH: 27.8 pg (ref 26.0–34.0)
MCHC: 31.2 g/dL (ref 30.0–36.0)
MCV: 89 fL (ref 80.0–100.0)
MONO ABS: 0.8 10*3/uL (ref 0.1–1.0)
Monocytes Relative: 11 %
NRBC: 0 % (ref 0.0–0.2)
Neutro Abs: 4.3 10*3/uL (ref 1.7–7.7)
Neutrophils Relative %: 60 %
Platelet Count: 214 10*3/uL (ref 150–400)
RBC: 4.18 MIL/uL (ref 3.87–5.11)
RDW: 13.2 % (ref 11.5–15.5)
WBC Count: 7.2 10*3/uL (ref 4.0–10.5)

## 2018-07-13 LAB — CMP (CANCER CENTER ONLY)
ALK PHOS: 85 U/L (ref 38–126)
ALT: 14 U/L (ref 0–44)
AST: 19 U/L (ref 15–41)
Albumin: 3.7 g/dL (ref 3.5–5.0)
Anion gap: 8 (ref 5–15)
BUN: 18 mg/dL (ref 8–23)
CALCIUM: 10.7 mg/dL — AB (ref 8.9–10.3)
CHLORIDE: 105 mmol/L (ref 98–111)
CO2: 28 mmol/L (ref 22–32)
Creatinine: 1.02 mg/dL — ABNORMAL HIGH (ref 0.44–1.00)
GFR, EST NON AFRICAN AMERICAN: 57 mL/min — AB (ref >60)
Glucose, Bld: 67 mg/dL — ABNORMAL LOW (ref 70–99)
Potassium: 4 mmol/L (ref 3.5–5.1)
SODIUM: 141 mmol/L (ref 135–145)
Total Bilirubin: 0.3 mg/dL (ref 0.3–1.2)
Total Protein: 7.3 g/dL (ref 6.5–8.1)

## 2018-07-13 MED ORDER — SODIUM CHLORIDE 0.9 % IV SOLN
600.0000 mg/m2 | Freq: Once | INTRAVENOUS | Status: AC
  Administered 2018-07-13: 1040 mg via INTRAVENOUS
  Filled 2018-07-13: qty 52

## 2018-07-13 MED ORDER — HEPARIN SOD (PORK) LOCK FLUSH 100 UNIT/ML IV SOLN
500.0000 [IU] | Freq: Once | INTRAVENOUS | Status: AC | PRN
  Administered 2018-07-13: 500 [IU]
  Filled 2018-07-13: qty 5

## 2018-07-13 MED ORDER — SODIUM CHLORIDE 0.9% FLUSH
10.0000 mL | INTRAVENOUS | Status: DC | PRN
  Administered 2018-07-13: 10 mL
  Filled 2018-07-13: qty 10

## 2018-07-13 MED ORDER — PALONOSETRON HCL INJECTION 0.25 MG/5ML
0.2500 mg | Freq: Once | INTRAVENOUS | Status: AC
  Administered 2018-07-13: 0.25 mg via INTRAVENOUS

## 2018-07-13 MED ORDER — PEGFILGRASTIM 6 MG/0.6ML ~~LOC~~ PSKT
PREFILLED_SYRINGE | SUBCUTANEOUS | Status: AC
  Filled 2018-07-13: qty 0.6

## 2018-07-13 MED ORDER — SODIUM CHLORIDE 0.9 % IV SOLN
Freq: Once | INTRAVENOUS | Status: AC
  Administered 2018-07-13: 14:00:00 via INTRAVENOUS
  Filled 2018-07-13: qty 250

## 2018-07-13 MED ORDER — PALONOSETRON HCL INJECTION 0.25 MG/5ML
INTRAVENOUS | Status: AC
  Filled 2018-07-13: qty 5

## 2018-07-13 MED ORDER — SODIUM CHLORIDE 0.9 % IV SOLN
Freq: Once | INTRAVENOUS | Status: AC
  Administered 2018-07-13: 15:00:00 via INTRAVENOUS
  Filled 2018-07-13: qty 5

## 2018-07-13 MED ORDER — PEGFILGRASTIM 6 MG/0.6ML ~~LOC~~ PSKT
6.0000 mg | PREFILLED_SYRINGE | Freq: Once | SUBCUTANEOUS | Status: AC
  Administered 2018-07-13: 6 mg via SUBCUTANEOUS

## 2018-07-13 MED ORDER — INFLUENZA VAC SPLIT QUAD 0.5 ML IM SUSY
PREFILLED_SYRINGE | INTRAMUSCULAR | Status: AC
  Filled 2018-07-13: qty 0.5

## 2018-07-13 MED ORDER — DOXORUBICIN HCL CHEMO IV INJECTION 2 MG/ML
60.0000 mg/m2 | Freq: Once | INTRAVENOUS | Status: AC
  Administered 2018-07-13: 104 mg via INTRAVENOUS
  Filled 2018-07-13: qty 52

## 2018-07-13 MED ORDER — INFLUENZA VAC SPLIT QUAD 0.5 ML IM SUSY
0.5000 mL | PREFILLED_SYRINGE | Freq: Once | INTRAMUSCULAR | Status: AC
  Administered 2018-07-13: 0.5 mL via INTRAMUSCULAR

## 2018-07-13 NOTE — Patient Instructions (Addendum)
[facility] Discharge Instructions for Patients Receiving Chemotherapy  Today you received the following chemotherapy agents: Doxorubicin (Adriamycin) and Cyclophosphamide (Cytoxan)  To help prevent nausea and vomiting after your treatment, we encourage you to take your nausea medication as directed. Received Aloxi during treatment today-->Take Compazine (not Zofran) for the next 3 days as needed.    If you develop nausea and vomiting that is not controlled by your nausea medication, call the clinic.   BELOW ARE SYMPTOMS THAT SHOULD BE REPORTED IMMEDIATELY:  *FEVER GREATER THAN 100.5 F  *CHILLS WITH OR WITHOUT FEVER  NAUSEA AND VOMITING THAT IS NOT CONTROLLED WITH YOUR NAUSEA MEDICATION  *UNUSUAL SHORTNESS OF BREATH  *UNUSUAL BRUISING OR BLEEDING  TENDERNESS IN MOUTH AND THROAT WITH OR WITHOUT PRESENCE OF ULCERS  *URINARY PROBLEMS  *BOWEL PROBLEMS  UNUSUAL RASH Items with * indicate a potential emergency and should be followed up as soon as possible.  Feel free to call the clinic should you have any questions or concerns. The clinic phone number is [phone].  Please show the [facility] at check-in to the Emergency Department and triage nurse.  Doxorubicin injection What is this medicine? DOXORUBICIN (dox oh ROO bi sin) is a chemotherapy drug. It is used to treat many kinds of cancer like leukemia, lymphoma, neuroblastoma, sarcoma, and Wilms' tumor. It is also used to treat bladder cancer, breast cancer, lung cancer, ovarian cancer, stomach cancer, and thyroid cancer. This medicine may be used for other purposes; ask your health care provider or pharmacist if you have questions. COMMON BRAND NAME(S): Adriamycin, Adriamycin PFS, Adriamycin RDF, Rubex What should I tell my health care provider before I take this medicine? They need to know if you have any of these conditions: -heart disease -history of low blood counts caused by a medicine -liver  disease -recent or ongoing radiation therapy -an unusual or allergic reaction to doxorubicin, other chemotherapy agents, other medicines, foods, dyes, or preservatives -pregnant or trying to get pregnant -breast-feeding How should I use this medicine? This drug is given as an infusion into a vein. It is administered in a hospital or clinic by a specially trained health care professional. If you have pain, swelling, burning or any unusual feeling around the site of your injection, tell your health care professional right away. Talk to your pediatrician regarding the use of this medicine in children. Special care may be needed. Overdosage: If you think you have taken too much of this medicine contact a poison control center or emergency room at once. NOTE: This medicine is only for you. Do not share this medicine with others. What if I miss a dose? It is important not to miss your dose. Call your doctor or health care professional if you are unable to keep an appointment. What may interact with this medicine? This medicine may interact with the following medications: -6-mercaptopurine -paclitaxel -phenytoin -St. John's Wort -trastuzumab -verapamil This list may not describe all possible interactions. Give your health care provider a list of all the medicines, herbs, non-prescription drugs, or dietary supplements you use. Also tell them if you smoke, drink alcohol, or use illegal drugs. Some items may interact with your medicine. What should I watch for while using this medicine? This drug may make you feel generally unwell. This is not uncommon, as chemotherapy can affect healthy cells as well as cancer cells. Report any side effects. Continue your course of treatment even though you feel ill unless your doctor tells you to stop.  There is a maximum amount of this medicine you should receive throughout your life. The amount depends on the medical condition being treated and your overall health.  Your doctor will watch how much of this medicine you receive in your lifetime. Tell your doctor if you have taken this medicine before. You may need blood work done while you are taking this medicine. Your urine may turn red for a few days after your dose. This is not blood. If your urine is dark or brown, call your doctor. In some cases, you may be given additional medicines to help with side effects. Follow all directions for their use. Call your doctor or health care professional for advice if you get a fever, chills or sore throat, or other symptoms of a cold or flu. Do not treat yourself. This drug decreases your body's ability to fight infections. Try to avoid being around people who are sick. This medicine may increase your risk to bruise or bleed. Call your doctor or health care professional if you notice any unusual bleeding. Talk to your doctor about your risk of cancer. You may be more at risk for certain types of cancers if you take this medicine. Do not become pregnant while taking this medicine or for 6 months after stopping it. Women should inform their doctor if they wish to become pregnant or think they might be pregnant. Men should not father a child while taking this medicine and for 6 months after stopping it. There is a potential for serious side effects to an unborn child. Talk to your health care professional or pharmacist for more information. Do not breast-feed an infant while taking this medicine. This medicine has caused ovarian failure in some women and reduced sperm counts in some men This medicine may interfere with the ability to have a child. Talk with your doctor or health care professional if you are concerned about your fertility. What side effects may I notice from receiving this medicine? Side effects that you should report to your doctor or health care professional as soon as possible: -allergic reactions like skin rash, itching or hives, swelling of the face, lips,  or tongue -breathing problems -chest pain -fast or irregular heartbeat -low blood counts - this medicine may decrease the number of white blood cells, red blood cells and platelets. You may be at increased risk for infections and bleeding. -pain, redness, or irritation at site where injected -signs of infection - fever or chills, cough, sore throat, pain or difficulty passing urine -signs of decreased platelets or bleeding - bruising, pinpoint red spots on the skin, black, tarry stools, blood in the urine -swelling of the ankles, feet, hands -tiredness -weakness Side effects that usually do not require medical attention (report to your doctor or health care professional if they continue or are bothersome): -diarrhea -hair loss -mouth sores -nail discoloration or damage -nausea -red colored urine -vomiting This list may not describe all possible side effects. Call your doctor for medical advice about side effects. You may report side effects to FDA at 1-800-FDA-1088. Where should I keep my medicine? This drug is given in a hospital or clinic and will not be stored at home. NOTE: This sheet is a summary. It may not cover all possible information. If you have questions about this medicine, talk to your doctor, pharmacist, or health care provider.  2018 Elsevier/Gold Standard (2015-11-03 11:28:51)  Cyclophosphamide injection What is this medicine? CYCLOPHOSPHAMIDE (sye kloe FOSS fa mide) is a chemotherapy drug. It  slows the growth of cancer cells. This medicine is used to treat many types of cancer like lymphoma, myeloma, leukemia, breast cancer, and ovarian cancer, to name a few. This medicine may be used for other purposes; ask your health care provider or pharmacist if you have questions. COMMON BRAND NAME(S): Cytoxan, Neosar What should I tell my health care provider before I take this medicine? They need to know if you have any of these conditions: -blood disorders -history of  other chemotherapy -infection -kidney disease -liver disease -recent or ongoing radiation therapy -tumors in the bone marrow -an unusual or allergic reaction to cyclophosphamide, other chemotherapy, other medicines, foods, dyes, or preservatives -pregnant or trying to get pregnant -breast-feeding How should I use this medicine? This drug is usually given as an injection into a vein or muscle or by infusion into a vein. It is administered in a hospital or clinic by a specially trained health care professional. Talk to your pediatrician regarding the use of this medicine in children. Special care may be needed. Overdosage: If you think you have taken too much of this medicine contact a poison control center or emergency room at once. NOTE: This medicine is only for you. Do not share this medicine with others. What if I miss a dose? It is important not to miss your dose. Call your doctor or health care professional if you are unable to keep an appointment. What may interact with this medicine? This medicine may interact with the following medications: -amiodarone -amphotericin B -azathioprine -certain antiviral medicines for HIV or AIDS such as protease inhibitors (e.g., indinavir, ritonavir) and zidovudine -certain blood pressure medications such as benazepril, captopril, enalapril, fosinopril, lisinopril, moexipril, monopril, perindopril, quinapril, ramipril, trandolapril -certain cancer medications such as anthracyclines (e.g., daunorubicin, doxorubicin), busulfan, cytarabine, paclitaxel, pentostatin, tamoxifen, trastuzumab -certain diuretics such as chlorothiazide, chlorthalidone, hydrochlorothiazide, indapamide, metolazone -certain medicines that treat or prevent blood clots like warfarin -certain muscle relaxants such as succinylcholine -cyclosporine -etanercept -indomethacin -medicines to increase blood counts like filgrastim, pegfilgrastim, sargramostim -medicines used as general  anesthesia -metronidazole -natalizumab This list may not describe all possible interactions. Give your health care provider a list of all the medicines, herbs, non-prescription drugs, or dietary supplements you use. Also tell them if you smoke, drink alcohol, or use illegal drugs. Some items may interact with your medicine. What should I watch for while using this medicine? Visit your doctor for checks on your progress. This drug may make you feel generally unwell. This is not uncommon, as chemotherapy can affect healthy cells as well as cancer cells. Report any side effects. Continue your course of treatment even though you feel ill unless your doctor tells you to stop. Drink water or other fluids as directed. Urinate often, even at night. In some cases, you may be given additional medicines to help with side effects. Follow all directions for their use. Call your doctor or health care professional for advice if you get a fever, chills or sore throat, or other symptoms of a cold or flu. Do not treat yourself. This drug decreases your body's ability to fight infections. Try to avoid being around people who are sick. This medicine may increase your risk to bruise or bleed. Call your doctor or health care professional if you notice any unusual bleeding. Be careful brushing and flossing your teeth or using a toothpick because you may get an infection or bleed more easily. If you have any dental work done, tell your dentist you are receiving this medicine.  You may get drowsy or dizzy. Do not drive, use machinery, or do anything that needs mental alertness until you know how this medicine affects you. Do not become pregnant while taking this medicine or for 1 year after stopping it. Women should inform their doctor if they wish to become pregnant or think they might be pregnant. Men should not father a child while taking this medicine and for 4 months after stopping it. There is a potential for serious side  effects to an unborn child. Talk to your health care professional or pharmacist for more information. Do not breast-feed an infant while taking this medicine. This medicine may interfere with the ability to have a child. This medicine has caused ovarian failure in some women. This medicine has caused reduced sperm counts in some men. You should talk with your doctor or health care professional if you are concerned about your fertility. If you are going to have surgery, tell your doctor or health care professional that you have taken this medicine. What side effects may I notice from receiving this medicine? Side effects that you should report to your doctor or health care professional as soon as possible: -allergic reactions like skin rash, itching or hives, swelling of the face, lips, or tongue -low blood counts - this medicine may decrease the number of white blood cells, red blood cells and platelets. You may be at increased risk for infections and bleeding. -signs of infection - fever or chills, cough, sore throat, pain or difficulty passing urine -signs of decreased platelets or bleeding - bruising, pinpoint red spots on the skin, black, tarry stools, blood in the urine -signs of decreased red blood cells - unusually weak or tired, fainting spells, lightheadedness -breathing problems -dark urine -dizziness -palpitations -swelling of the ankles, feet, hands -trouble passing urine or change in the amount of urine -weight gain -yellowing of the eyes or skin Side effects that usually do not require medical attention (report to your doctor or health care professional if they continue or are bothersome): -changes in nail or skin color -hair loss -missed menstrual periods -mouth sores -nausea, vomiting This list may not describe all possible side effects. Call your doctor for medical advice about side effects. You may report side effects to FDA at 1-800-FDA-1088. Where should I keep my  medicine? This drug is given in a hospital or clinic and will not be stored at home. NOTE: This sheet is a summary. It may not cover all possible information. If you have questions about this medicine, talk to your doctor, pharmacist, or health care provider.  2018 Elsevier/Gold Standard (2012-07-21 16:22:58)   Influenza (Flu) Vaccine (Inactivated or Recombinant): What You Need to Know 1. Why get vaccinated? Influenza ("flu") is a contagious disease that spreads around the [place] every year, usually between October and May. Flu is caused by influenza viruses, and is spread mainly by coughing, sneezing, and close contact. Anyone can get flu. Flu strikes suddenly and can last several days. Symptoms vary by age, but can include:  fever/chills  sore throat  muscle aches  fatigue  cough  headache  runny or stuffy nose  Flu can also lead to pneumonia and blood infections, and cause diarrhea and seizures in children. If you have a medical condition, such as heart or lung disease, flu can make it worse. Flu is more dangerous for some people. Infants and young children, people 39 years of age and older, pregnant women, and people with certain health conditions or  a weakened immune system are at greatest risk. Each year thousands of people in the [place] die from flu, and many more are hospitalized. Flu vaccine can:  keep you from getting flu,  make flu less severe if you do get it, and  keep you from spreading flu to your family and other people. 2. Inactivated and recombinant flu vaccines A dose of flu vaccine is recommended every flu season. Children 6 months through 71 years of age may need two doses during the same flu season. Everyone else needs only one dose each flu season. Some inactivated flu vaccines contain a very small amount of a mercury-based preservative called thimerosal. Studies have not shown thimerosal in vaccines to be harmful, but flu vaccines that do  not contain thimerosal are available. There is no live flu virus in flu shots. They cannot cause the flu. There are many flu viruses, and they are always changing. Each year a new flu vaccine is made to protect against three or four viruses that are likely to cause disease in the upcoming flu season. But even when the vaccine doesn't exactly match these viruses, it may still provide some protection. Flu vaccine cannot prevent:  flu that is caused by a virus not covered by the vaccine, or  illnesses that look like flu but are not.  It takes about 2 weeks for protection to develop after vaccination, and protection lasts through the flu season. 3. Some people should not get this vaccine Tell the person who is giving you the vaccine:  If you have any severe, life-threatening allergies. If you ever had a life-threatening allergic reaction after a dose of flu vaccine, or have a severe allergy to any part of this vaccine, you may be advised not to get vaccinated. Most, but not all, types of flu vaccine contain a small amount of egg protein.  If you ever had Guillain-Barr Syndrome (also called GBS). Some people with a history of GBS should not get this vaccine. This should be discussed with your doctor.  If you are not feeling well. It is usually okay to get flu vaccine when you have a mild illness, but you might be asked to come back when you feel better.  4. Risks of a vaccine reaction With any medicine, including vaccines, there is a chance of reactions. These are usually mild and go away on their own, but serious reactions are also possible. Most people who get a flu shot do not have any problems with it. Minor problems following a flu shot include:  soreness, redness, or swelling where the shot was given  hoarseness  sore, red or itchy eyes  cough  fever  aches  headache  itching  fatigue  If these problems occur, they usually begin soon after the shot and last 1 or 2  days. More serious problems following a flu shot can include the following:  There may be a small increased risk of Guillain-Barre Syndrome (GBS) after inactivated flu vaccine. This risk has been estimated at 1 or 2 additional cases per million people vaccinated. This is much lower than the risk of severe complications from flu, which can be prevented by flu vaccine.  Young children who get the flu shot along with pneumococcal vaccine (PCV13) and/or DTaP vaccine at the same time might be slightly more likely to have a seizure caused by fever. Ask your doctor for more information. Tell your doctor if a child who is getting flu vaccine has ever had a  seizure.  Problems that could happen after any injected vaccine:  People sometimes faint after a medical procedure, including vaccination. Sitting or lying down for about 15 minutes can help prevent fainting, and injuries caused by a fall. Tell your doctor if you feel dizzy, or have vision changes or ringing in the ears.  Some people get severe pain in the shoulder and have difficulty moving the arm where a shot was given. This happens very rarely.  Any medication can cause a severe allergic reaction. Such reactions from a vaccine are very rare, estimated at about 1 in a million doses, and would happen within a few minutes to a few hours after the vaccination. As with any medicine, there is a very remote chance of a vaccine causing a serious injury or death. The safety of vaccines is always being monitored. For more information, visit: http://www.aguilar.org/ 5. What if there is a serious reaction? What should I look for? Look for anything that concerns you, such as signs of a severe allergic reaction, very high fever, or unusual behavior. Signs of a severe allergic reaction can include hives, swelling of the face and throat, difficulty breathing, a fast heartbeat, dizziness, and weakness. These would start a few minutes to a few hours after the  vaccination. What should I do?  If you think it is a severe allergic reaction or other emergency that can't wait, call 9-1-1 and get the person to the nearest hospital. Otherwise, call your doctor.  Reactions should be reported to the Vaccine Adverse Event Reporting System (VAERS). Your doctor should file this report, or you can do it yourself through the VAERS web site at www.vaers.SamedayNews.es, or by calling (912)219-6006. ? VAERS does not give medical advice. 6. The National Vaccine Injury Compensation Program The [employer] Vaccine Injury Compensation Program (VICP) is a federal program that was created to compensate people who may have been injured by certain vaccines. Persons who believe they may have been injured by a vaccine can learn about the program and about filing a claim by calling (878) 220-3580 or visiting the [facility] website at GoldCloset.com.ee. There is a time limit to file a claim for compensation. 7. How can I learn more?  Ask your healthcare provider. He or she can give you the vaccine package insert or suggest other sources of information.  Call your local or state health department.  Contact the Centers for Disease Control and Prevention (CDC): ? Call 2157933791 (1-800-CDC-INFO) or ? Visit CDC's website at https://gibson.com/ Vaccine Information Statement, Inactivated Influenza Vaccine (04/26/2014) This information is not intended to replace advice given to you by your health care provider. Make sure you discuss any questions you have with your health care provider. Document Released: 07/01/2006 Document Revised: 05/27/2016 Document Reviewed: 05/27/2016 Elsevier Interactive Patient Education  2017 [employer].

## 2018-07-13 NOTE — Progress Notes (Signed)
[place]  [phone] [phone]  Clinic Follow up Note   Patient Care Team: [facility] as PCP - General Coralie Keens, MD as Consulting Physician (General Surgery) Truitt Merle, MD as Consulting Physician (Hematology) 07/13/2018  SUMMARY OF ONCOLOGIC HISTORY:   Cancer of central portion of left female breast ([place])   04/28/2018 Cancer Staging    Staging form: Breast, AJCC 8th Edition - Clinical stage from 04/28/2018: Stage IB (cT1c, cN0, cM0, G3, ER-, PR-, HER2-) - Signed by Truitt Merle, MD on 05/24/2018    05/24/2018 Initial Diagnosis    Cancer of central portion of left female breast ([place])    06/02/2018 Cancer Staging    Staging form: Breast, AJCC 8th Edition - Pathologic stage from 06/02/2018: Stage IIA (pT2, pN0, cM0, G3, ER-, PR-, HER2-) - Signed by Truitt Merle, MD on 06/23/2018    07/06/2018 -  Chemotherapy    The patient had DOXOrubicin (ADRIAMYCIN) chemo injection 104 mg, 60 mg/m2 = 104 mg, Intravenous,  Once, 1 of 4 cycles palonosetron (ALOXI) injection 0.25 mg, 0.25 mg, Intravenous,  Once, 1 of 4 cycles pegfilgrastim (NEULASTA ONPRO KIT) injection 6 mg, 6 mg, Subcutaneous, Once, 1 of 4 cycles cyclophosphamide (CYTOXAN) 1,040 mg in sodium chloride 0.9 % 250 mL chemo infusion, 600 mg/m2 = 1,040 mg, Intravenous,  Once, 1 of 4 cycles PACLitaxel (TAXOL) 138 mg in sodium chloride 0.9 % 250 mL chemo infusion (</= 51m/m2), 80 mg/m2, Intravenous,  Once, 0 of 12 cycles fosaprepitant (EMEND) 150 mg, dexamethasone (DECADRON) 12 mg in sodium chloride 0.9 % 145 mL IVPB, , Intravenous,  Once, 1 of 4 cycles  for chemotherapy treatment.     07/11/2018 Imaging    Bone scan IMPRESSION: No scintigraphic evidence of osseous metastatic disease.   CURRENT THERAPY: PENDING adjuvant chemo AC q2 weeks x4 cycles followed by taxol q2 weeks x4, possible addition of carboplatin with taxol; adjuvant chemo starting 07/13/18   INTERVAL  HISTORY: Ms. BHelinskireturns as scheduled to begin adjuvant chemo. She continues to feel well following breast surgery. She denies breast pain or limited mobility/ROM. She denies fatigue or decreased appetite. Denies n/v/c/d. Denies fever, chills, cough, chest pain, dyspnea, leg edema, or neuropathy. She attended chemo class, underwent bone scan, and echo. She feels prepared for chemotherapy. Will pick up anti-emetics today.    MEDICAL HISTORY:  Past Medical History:  Diagnosis Date  . Asthma   . [place]    Left Breast  . [place] disease[place]   . Hypertension     SURGICAL HISTORY: Past Surgical History:  Procedure Laterality Date  . COLONOSCOPY N/A 03/18/2016   Procedure: COLONOSCOPY;  Surgeon: SDanie Binder MD;  [facility];  Service: Endoscopy;  Laterality: N/A;  10:30 AM  . PARTIAL MASTECTOMY WITH AXILLARY SENTINEL LYMPH NODE BIOPSY Left 06/07/2018   Procedure: LEFT BREAST PARTIAL MASTECTOMY WITH AXILLARY SENTINEL LYMPH NODE BIOPSY;  Surgeon: BCoralie Keens MD;  [facility] [facility]  Service: General;  Laterality: Left;  . PORTACATH PLACEMENT N/A 06/07/2018   Procedure: INSERTION PORT-A-CATH;  Surgeon: BCoralie Keens MD;  [place] [facility]  Service: General;  Laterality: N/A;    I have reviewed the social history and family history with the patient and they are unchanged from previous note.  ALLERGIES:  is allergic to carrot [daucus carota] and other.  MEDICATIONS:  Current Outpatient Medications  Medication Sig Dispense Refill  . albuterol (ACCUNEB) 1.25 MG/3ML nebulizer solution  Take 1 ampule by nebulization every 6 (six) hours as needed for wheezing or shortness of breath.     Marland Kitchen albuterol (PROVENTIL HFA;VENTOLIN HFA) 108 (90 Base) MCG/ACT inhaler Inhale 2 puffs into the lungs every 6 (six) hours as needed for wheezing or shortness of breath.     . cholecalciferol (VITAMIN D) 1000 units tablet Take 1,000 Units by mouth daily.   1   . hydrochlorothiazide (MICROZIDE) 12.5 MG capsule Take 12.5 mg by mouth daily.    Marland Kitchen lidocaine-prilocaine (EMLA) cream Apply to affected area once 30 g 3  . lisinopril (PRINIVIL,ZESTRIL) 10 MG tablet Take 10 mg by mouth daily.    . ondansetron (ZOFRAN) 8 MG tablet Take 1 tablet (8 mg total) by mouth 2 (two) times daily as needed. Start on the third day after chemotherapy. 30 tablet 1  . oxyCODONE (OXY IR/ROXICODONE) 5 MG immediate release tablet Take 1 tablet (5 mg total) by mouth every 6 (six) hours as needed for moderate pain or severe pain. 30 tablet 0  . prochlorperazine (COMPAZINE) 10 MG tablet Take 1 tablet (10 mg total) by mouth every 6 (six) hours as needed (Nausea or vomiting). 30 tablet 1  . ranitidine (ZANTAC) 150 MG tablet Take 150 mg by mouth daily.   1   No current facility-administered medications for this visit.    Facility-Administered Medications Ordered in Other Visits  Medication Dose Route Frequency Provider Last Rate Last Dose  . cyclophosphamide (CYTOXAN) 1,040 mg in sodium chloride 0.9 % 250 mL chemo infusion  600 mg/m2 (Treatment Plan Recorded) Intravenous Once Truitt Merle, MD      . DOXOrubicin (ADRIAMYCIN) chemo injection 104 mg  60 mg/m2 (Treatment Plan Recorded) Intravenous Once Truitt Merle, MD      . heparin lock flush 100 unit/mL  500 Units Intracatheter Once PRN Truitt Merle, MD      . Influenza vac split quadrivalent PF (FLUARIX) injection 0.5 mL  0.5 mL Intramuscular Once Truitt Merle, MD      . pegfilgrastim (NEULASTA ONPRO KIT) injection 6 mg  6 mg Subcutaneous Once Cira Rue K, NP      . sodium chloride flush (NS) 0.9 % injection 10 mL  10 mL Intracatheter PRN Truitt Merle, MD        PHYSICAL EXAMINATION: ECOG PERFORMANCE STATUS: 0 - Asymptomatic  Vitals:   07/13/18 1300  BP: 136/75  Pulse: 74  Resp: 17  Temp: 99 F (37.2 C)  SpO2: 100%   Filed Weights   07/13/18 1300  Weight: 142 lb 14.4 oz (64.8 kg)    GENERAL:alert, no distress and  comfortable SKIN: no rashes or significant lesions EYES:  sclera clear OROPHARYNX:no thrush or ulcers LYMPH:  no palpable cervical or supraclavicular lymphadenopathy LUNGS: clear to auscultation with normal breathing effort HEART: regular rate & rhythm, no lower extremity edema ABDOMEN:abdomen soft, non-tender and normal bowel sounds Musculoskeletal:no cyanosis of digits and no clubbing  NEURO: alert & oriented x 3 with fluent speech, no focal motor/sensory deficits PAC without erythema Breast exam: s/p left lumpectomy, incision is healing well without signs of infection, no tenderness.   LABORATORY DATA:  I have reviewed the data as listed CBC Latest Ref Rng & Units 07/13/2018 06/07/2018  WBC 4.0 - 10.5 K/uL 7.2 6.6  Hemoglobin 12.0 - 15.0 g/dL 11.6(L) 12.9  Hematocrit 36.0 - 46.0 % 37.2 42.1  Platelets 150 - 400 K/uL 214 225     CMP Latest Ref Rng & Units 07/13/2018 06/07/2018  Glucose  70 - 99 mg/dL 67(L) 98  BUN 8 - 23 mg/dL 18 18  Creatinine 0.44 - 1.00 mg/dL 1.02(H) 0.92  Sodium 135 - 145 mmol/L 141 142  Potassium 3.5 - 5.1 mmol/L 4.0 4.0  Chloride 98 - 111 mmol/L 105 107  CO2 22 - 32 mmol/L 28 27  Calcium 8.9 - 10.3 mg/dL 10.7(H) 10.4(H)  Total Protein 6.5 - 8.1 g/dL 7.3 -  Total Bilirubin 0.3 - 1.2 mg/dL 0.3 -  Alkaline Phos 38 - 126 U/L 85 -  AST 15 - 41 U/L 19 -  ALT 0 - 44 U/L 14 -      RADIOGRAPHIC STUDIES: I have personally reviewed the radiological images as listed and agreed with the findings in the report. No results found.   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 Gau appears stable. She has recovered well from left lumpectomy. She underwent staging work up with bone scan, which showed uptake in  the shoulders, right wrist, right knee, left lateral lumbar spine, and right c spine typical of degenerative origin, no evidence of osseous metastasis. Echo revealed EF 55-60% with normal LV systolic function and mild diastolic dysfunction. CT CAP was not approved by her insurance and not done.  -she attended chemo class. We reviewed f/u questions. She will pick up pre meds and begin claritin for potential bone pain with neulasta.  -Labs reviewed, adequate to proceed with cycle 1. She has mild anemia, Hgb 11.6.  -she will return in 2 weeks for f/u and cycle 2 -she will get a flu shot. Her granddaughter who is 41 lives with her, we reviewed infection precautions. She also has had a flu shot.   2. Hypercalcemia  -Ca 10.7; not on calcium supplement. I encouraged her to avoid calcium supplement or increased dairy products, and to increase po hydration, she agrees.   3. COPD, HTN -She will continue her medication, and follow-up with primary care physician.  4.Smoking cessation -she reports she does not smoke  Plan -Labs, bone scan, echo reviewed -proceed with adjuvant chemo cycle 1 AC  -f/u in 2 weeks with cycle 2  All questions were answered. The patient knows to call the clinic with any problems, questions or concerns. No barriers to learning was detected.     Alla Feeling, NP 07/13/18

## 2018-07-13 NOTE — Progress Notes (Signed)
Went to infusion to meet patient to introduce myself as Arboriculturist and to offer available resources.  Discussed one-time $1000 to assist with personal expenses such as gas cards;etc while going through treatment. Advised patient proof of household income would be needed to apply and she may bring at her next visit on 07/27/18. She verbalized understanding. Gave my card to family member at bedside.

## 2018-07-18 ENCOUNTER — Telehealth: Payer: Self-pay

## 2018-07-18 NOTE — Telephone Encounter (Signed)
-----   Message from Zola Button, RN sent at 07/13/2018  5:14 PM EDT ----- Regarding: Dr. Burr Medico; First time F/U Call Pt received first time Doxorubicin and Cytoxan, also had OnPro device. Tolerated everything well. Thank you!

## 2018-07-18 NOTE — Telephone Encounter (Signed)
Spoke with patient's daughter after treatment yesterday, she states patient is not having any vomiting or diarrhea, denies any pain, doing well.

## 2018-07-20 ENCOUNTER — Ambulatory Visit: Payer: Medicaid Other | Admitting: Nurse Practitioner

## 2018-07-20 ENCOUNTER — Ambulatory Visit: Payer: Medicaid Other

## 2018-07-20 ENCOUNTER — Other Ambulatory Visit: Payer: Medicaid Other

## 2018-07-27 ENCOUNTER — Inpatient Hospital Stay: Payer: Medicaid Other

## 2018-07-27 ENCOUNTER — Inpatient Hospital Stay: Payer: Medicaid Other | Attending: Nurse Practitioner

## 2018-07-27 ENCOUNTER — Encounter: Payer: Self-pay | Admitting: Nurse Practitioner

## 2018-07-27 ENCOUNTER — Telehealth: Payer: Self-pay | Admitting: Hematology

## 2018-07-27 ENCOUNTER — Encounter: Payer: Self-pay | Admitting: *Deleted

## 2018-07-27 ENCOUNTER — Inpatient Hospital Stay (HOSPITAL_BASED_OUTPATIENT_CLINIC_OR_DEPARTMENT_OTHER): Payer: Medicaid Other | Admitting: Nurse Practitioner

## 2018-07-27 VITALS — BP 127/69 | HR 91 | Temp 98.2°F | Resp 19 | Ht 66.0 in | Wt 140.1 lb

## 2018-07-27 DIAGNOSIS — I10 Essential (primary) hypertension: Secondary | ICD-10-CM | POA: Diagnosis not present

## 2018-07-27 DIAGNOSIS — Z5111 Encounter for antineoplastic chemotherapy: Secondary | ICD-10-CM | POA: Diagnosis present

## 2018-07-27 DIAGNOSIS — Z171 Estrogen receptor negative status [ER-]: Secondary | ICD-10-CM

## 2018-07-27 DIAGNOSIS — Z5189 Encounter for other specified aftercare: Secondary | ICD-10-CM | POA: Insufficient documentation

## 2018-07-27 DIAGNOSIS — C50112 Malignant neoplasm of central portion of left female breast: Secondary | ICD-10-CM

## 2018-07-27 DIAGNOSIS — J449 Chronic obstructive pulmonary disease, unspecified: Secondary | ICD-10-CM | POA: Insufficient documentation

## 2018-07-27 DIAGNOSIS — R197 Diarrhea, unspecified: Secondary | ICD-10-CM | POA: Diagnosis not present

## 2018-07-27 DIAGNOSIS — D493 Neoplasm of unspecified behavior of breast: Secondary | ICD-10-CM

## 2018-07-27 LAB — CMP (CANCER CENTER ONLY)
ALBUMIN: 3.3 g/dL — AB (ref 3.5–5.0)
ALT: 18 U/L (ref 0–44)
AST: 10 U/L — AB (ref 15–41)
Alkaline Phosphatase: 114 U/L (ref 38–126)
Anion gap: 5 (ref 5–15)
BUN: 11 mg/dL (ref 8–23)
CHLORIDE: 110 mmol/L (ref 98–111)
CO2: 25 mmol/L (ref 22–32)
Calcium: 10 mg/dL (ref 8.9–10.3)
Creatinine: 0.9 mg/dL (ref 0.44–1.00)
GFR, Est AFR Am: >60 mL/min (ref >60)
GFR, Estimated: >60 mL/min (ref >60)
GLUCOSE: 112 mg/dL — AB (ref 70–99)
POTASSIUM: 4 mmol/L (ref 3.5–5.1)
Sodium: 140 mmol/L (ref 135–145)
Total Protein: 6.7 g/dL (ref 6.5–8.1)

## 2018-07-27 LAB — CBC WITH DIFFERENTIAL (CANCER CENTER ONLY)
ABS IMMATURE GRANULOCYTES: 0.35 10*3/uL — AB (ref 0.00–0.07)
Basophils Absolute: 0.1 10*3/uL (ref 0.0–0.1)
Basophils Relative: 0 %
EOS ABS: 0 10*3/uL (ref 0.0–0.5)
Eosinophils Relative: 0 %
HCT: 32.2 % — ABNORMAL LOW (ref 36.0–46.0)
Hemoglobin: 10 g/dL — ABNORMAL LOW (ref 12.0–15.0)
IMMATURE GRANULOCYTES: 3 %
LYMPHS PCT: 12 %
Lymphs Abs: 1.6 10*3/uL (ref 0.7–4.0)
MCH: 27.5 pg (ref 26.0–34.0)
MCHC: 31.1 g/dL (ref 30.0–36.0)
MCV: 88.7 fL (ref 80.0–100.0)
MONOS PCT: 10 %
Monocytes Absolute: 1.4 10*3/uL — ABNORMAL HIGH (ref 0.1–1.0)
NEUTROS ABS: 10.1 10*3/uL — AB (ref 1.7–7.7)
NRBC: 0.2 % (ref 0.0–0.2)
Neutrophils Relative %: 75 %
Platelet Count: 299 10*3/uL (ref 150–400)
RBC: 3.63 MIL/uL — ABNORMAL LOW (ref 3.87–5.11)
RDW: 12.9 % (ref 11.5–15.5)
WBC Count: 13.6 10*3/uL — ABNORMAL HIGH (ref 4.0–10.5)

## 2018-07-27 MED ORDER — PEGFILGRASTIM 6 MG/0.6ML ~~LOC~~ PSKT
6.0000 mg | PREFILLED_SYRINGE | Freq: Once | SUBCUTANEOUS | Status: AC
  Administered 2018-07-27: 6 mg via SUBCUTANEOUS

## 2018-07-27 MED ORDER — SODIUM CHLORIDE 0.9% FLUSH
10.0000 mL | INTRAVENOUS | Status: DC | PRN
  Administered 2018-07-27: 10 mL
  Filled 2018-07-27: qty 10

## 2018-07-27 MED ORDER — PEGFILGRASTIM 6 MG/0.6ML ~~LOC~~ PSKT
PREFILLED_SYRINGE | SUBCUTANEOUS | Status: AC
  Filled 2018-07-27: qty 0.6

## 2018-07-27 MED ORDER — SODIUM CHLORIDE 0.9 % IV SOLN
Freq: Once | INTRAVENOUS | Status: AC
  Administered 2018-07-27: 14:00:00 via INTRAVENOUS
  Filled 2018-07-27: qty 5

## 2018-07-27 MED ORDER — SODIUM CHLORIDE 0.9 % IV SOLN
600.0000 mg/m2 | Freq: Once | INTRAVENOUS | Status: AC
  Administered 2018-07-27: 1040 mg via INTRAVENOUS
  Filled 2018-07-27: qty 52

## 2018-07-27 MED ORDER — LOPERAMIDE HCL 2 MG PO CAPS: ORAL_CAPSULE | ORAL | 1 refills | Status: DC

## 2018-07-27 MED ORDER — DOXORUBICIN HCL CHEMO IV INJECTION 2 MG/ML
60.0000 mg/m2 | Freq: Once | INTRAVENOUS | Status: AC
  Administered 2018-07-27: 104 mg via INTRAVENOUS
  Filled 2018-07-27: qty 52

## 2018-07-27 MED ORDER — HEPARIN SOD (PORK) LOCK FLUSH 100 UNIT/ML IV SOLN
500.0000 [IU] | Freq: Once | INTRAVENOUS | Status: AC | PRN
  Administered 2018-07-27: 500 [IU]
  Filled 2018-07-27: qty 5

## 2018-07-27 MED ORDER — PALONOSETRON HCL INJECTION 0.25 MG/5ML
0.2500 mg | Freq: Once | INTRAVENOUS | Status: AC
  Administered 2018-07-27: 0.25 mg via INTRAVENOUS

## 2018-07-27 MED ORDER — SODIUM CHLORIDE 0.9 % IV SOLN
Freq: Once | INTRAVENOUS | Status: AC
  Administered 2018-07-27: 13:00:00 via INTRAVENOUS
  Filled 2018-07-27: qty 250

## 2018-07-27 MED ORDER — PALONOSETRON HCL INJECTION 0.25 MG/5ML
INTRAVENOUS | Status: AC
  Filled 2018-07-27: qty 5

## 2018-07-27 NOTE — Patient Instructions (Addendum)
[facility] Discharge Instructions for Patients Receiving Chemotherapy  Today you received the following chemotherapy agents: Doxorubicin (Adriamycin) and Cyclophosphamide (Cytoxan), Neulasta Onpro.  To help prevent nausea and vomiting after your treatment, we encourage you to take your nausea medication as directed. Received Aloxi during treatment today-->Take Compazine (not Zofran) for the next 3 days as needed.    If you develop nausea and vomiting that is not controlled by your nausea medication, call the clinic.   BELOW ARE SYMPTOMS THAT SHOULD BE REPORTED IMMEDIATELY:  *FEVER GREATER THAN 100.5 F  *CHILLS WITH OR WITHOUT FEVER  NAUSEA AND VOMITING THAT IS NOT CONTROLLED WITH YOUR NAUSEA MEDICATION  *UNUSUAL SHORTNESS OF BREATH  *UNUSUAL BRUISING OR BLEEDING  TENDERNESS IN MOUTH AND THROAT WITH OR WITHOUT PRESENCE OF ULCERS  *URINARY PROBLEMS  *BOWEL PROBLEMS  UNUSUAL RASH Items with * indicate a potential emergency and should be followed up as soon as possible.  Feel free to call the clinic should you have any questions or concerns. The clinic phone number is [phone].  Please show the [facility] at check-in to the Emergency Department and triage nurse.

## 2018-07-27 NOTE — Telephone Encounter (Signed)
Cancel 11/14 and 11/27 f/u  Per 11/7 los

## 2018-07-27 NOTE — Progress Notes (Signed)
[place] [facility]  Telephone:(336) [phone] Fax:(336) [phone]  Clinic Follow up Note   Patient Care Team: [facility] as PCP - General Coralie Keens, MD as Consulting Physician (General Surgery) Truitt Merle, MD as Consulting Physician (Hematology) 07/27/2018  SUMMARY OF ONCOLOGIC HISTORY:   Cancer of central portion of left female breast ([place])   04/28/2018 Cancer Staging    Staging form: Breast, AJCC 8th Edition - Clinical stage from 04/28/2018: Stage IB (cT1c, cN0, cM0, G3, ER-, PR-, HER2-) - Signed by Truitt Merle, MD on 05/24/2018    05/24/2018 Initial Diagnosis    Cancer of central portion of left female breast ([place])    06/02/2018 Cancer Staging    Staging form: Breast, AJCC 8th Edition - Pathologic stage from 06/02/2018: Stage IIA (pT2, pN0, cM0, G3, ER-, PR-, HER2-) - Signed by Truitt Merle, MD on 06/23/2018    07/06/2018 -  Chemotherapy    The patient had DOXOrubicin (ADRIAMYCIN) chemo injection 104 mg, 60 mg/m2 = 104 mg, Intravenous,  Once, 1 of 4 cycles Administration: 104 mg (07/13/2018) palonosetron (ALOXI) injection 0.25 mg, 0.25 mg, Intravenous,  Once, 1 of 4 cycles Administration: 0.25 mg (07/13/2018) pegfilgrastim (NEULASTA ONPRO KIT) injection 6 mg, 6 mg, Subcutaneous, Once, 1 of 4 cycles Administration: 6 mg (07/13/2018) cyclophosphamide (CYTOXAN) 1,040 mg in sodium chloride 0.9 % 250 mL chemo infusion, 600 mg/m2 = 1,040 mg, Intravenous,  Once, 1 of 4 cycles Administration: 1,040 mg (07/13/2018) PACLitaxel (TAXOL) 138 mg in sodium chloride 0.9 % 250 mL chemo infusion (</= 78m/m2), 80 mg/m2, Intravenous,  Once, 0 of 12 cycles fosaprepitant (EMEND) 150 mg, dexamethasone (DECADRON) 12 mg in sodium chloride 0.9 % 145 mL IVPB, , Intravenous,  Once, 1 of 4 cycles Administration:  (07/13/2018)  for chemotherapy treatment.     07/11/2018 Imaging    Bone scan IMPRESSION: No scintigraphic evidence of osseous metastatic disease.   CURRENT  THERAPY:Adjuvant chemo AC q2 weeks x4 cycles followed by taxol q2 weeks x4, possible addition of carboplatin with taxol; adjuvant chemo starting 07/13/18   INTERVAL HISTORY: Ms. BPodgurskireturns for follow up as scheduled. She completed cycle 1 AC with Onpro 10/24. She felt weak for 4-5 days which she attributes to the flu vaccine she also received that day. She rested most of the day but could still perform ADLs and eat/drink. She had diarrhea 4-5 days, with 2 episodes per day. She did not use imodium. Denies blood in stool. Denies n/v, she did not require anti-emetics. She had increased left hip pain for a few days after treatment, she uses topical OTC patch. Otherwise she tolerated well. Denies mucositis, fever, chills, cough, chest pain, dyspnea, leg edema, neuropathy, or changes in her breasts.    MEDICAL HISTORY:  Past Medical History:  Diagnosis Date  . Asthma   . [place]    Left Breast  . COPD (chronic obstructive pulmonary disease) ([place]   . Hypertension     SURGICAL HISTORY: Past Surgical History:  Procedure Laterality Date  . COLONOSCOPY N/A 03/18/2016   Procedure: COLONOSCOPY;  Surgeon: SDanie Binder MD;  [facility];  Service: Endoscopy;  Laterality: N/A;  10:30 AM  . PARTIAL MASTECTOMY WITH AXILLARY SENTINEL LYMPH NODE BIOPSY Left 06/07/2018   Procedure: LEFT BREAST PARTIAL MASTECTOMY WITH AXILLARY SENTINEL LYMPH NODE BIOPSY;  Surgeon: BCoralie Keens MD;  [facility]  Service: General;  Laterality: Left;  . PORTACATH PLACEMENT N/A 06/07/2018   Procedure: INSERTION PORT-A-CATH;  Surgeon:  Coralie Keens, MD;  Location: [facility];  Service: General;  Laterality: N/A;    I have reviewed the social history and family history with the patient and they are unchanged from previous note.  ALLERGIES:  is allergic to carrot [daucus carota] and other.  MEDICATIONS:  Current Outpatient Medications  Medication Sig Dispense Refill  . albuterol (ACCUNEB) 1.25  MG/3ML nebulizer solution Take 1 ampule by nebulization every 6 (six) hours as needed for wheezing or shortness of breath.     Marland Kitchen albuterol (PROVENTIL HFA;VENTOLIN HFA) 108 (90 Base) MCG/ACT inhaler Inhale 2 puffs into the lungs every 6 (six) hours as needed for wheezing or shortness of breath.     . cholecalciferol (VITAMIN D) 1000 units tablet Take 1,000 Units by mouth daily.   1  . hydrochlorothiazide (MICROZIDE) 12.5 MG capsule Take 12.5 mg by mouth daily.    Marland Kitchen lidocaine-prilocaine (EMLA) cream Apply to affected area once 30 g 3  . lisinopril (PRINIVIL,ZESTRIL) 10 MG tablet Take 10 mg by mouth daily.    . ondansetron (ZOFRAN) 8 MG tablet Take 1 tablet (8 mg total) by mouth 2 (two) times daily as needed. Start on the third day after chemotherapy. 30 tablet 1  . prochlorperazine (COMPAZINE) 10 MG tablet Take 1 tablet (10 mg total) by mouth every 6 (six) hours as needed (Nausea or vomiting). 30 tablet 1  . ranitidine (ZANTAC) 150 MG tablet Take 150 mg by mouth daily.   1  . loperamide (IMODIUM) 2 MG capsule Take 2 tabs with first watery stool then 1 tab after each subsequent watery stool. Do not exceed 8 tabs per day 40 capsule 1  . oxyCODONE (OXY IR/ROXICODONE) 5 MG immediate release tablet Take 1 tablet (5 mg total) by mouth every 6 (six) hours as needed for moderate pain or severe pain. 30 tablet 0   No current facility-administered medications for this visit.     PHYSICAL EXAMINATION: ECOG PERFORMANCE STATUS: 1 - Symptomatic but completely ambulatory  Vitals:   07/27/18 1058  BP: 127/69  Pulse: 91  Resp: 19  Temp: 98.2 F (36.8 C)  SpO2: 99%   Filed Weights   07/27/18 1058  Weight: 140 lb 1.6 oz (63.5 kg)    GENERAL:alert, no distress and comfortable SKIN:  no rashes or significant lesions EYES:  sclera clear OROPHARYNX:no thrush or ulcers LYMPH:  no palpable cervical or supraclavicular lymphadenopathy LUNGS: clear to auscultation with normal breathing effort HEART: regular  rate & rhythm, no lower extremity edema ABDOMEN:abdomen soft, non-tender and normal bowel sounds Musculoskeletal:no cyanosis of digits and no clubbing  NEURO: alert & oriented x 3 with fluent speech, no focal motor/sensory deficits Breast: left breast incision is well healed, no erythema or drainage. Complete breast exam deferred  PAC without erythema    LABORATORY DATA:  I have reviewed the data as listed CBC Latest Ref Rng & Units 07/27/2018 07/13/2018 06/07/2018  WBC 4.0 - 10.5 K/uL 13.6(H) 7.2 6.6  Hemoglobin 12.0 - 15.0 g/dL 10.0(L) 11.6(L) 12.9  Hematocrit 36.0 - 46.0 % 32.2(L) 37.2 42.1  Platelets 150 - 400 K/uL 299 214 225     CMP Latest Ref Rng & Units 07/27/2018 07/13/2018 06/07/2018  Glucose 70 - 99 mg/dL 112(H) 67(L) 98  BUN 8 - 23 mg/dL 11 18 18   Creatinine 0.44 - 1.00 mg/dL 0.90 1.02(H) 0.92  Sodium 135 - 145 mmol/L 140 141 142  Potassium 3.5 - 5.1 mmol/L 4.0 4.0 4.0  Chloride 98 - 111 mmol/L 110  105 107  CO2 22 - 32 mmol/L 25 28 27   Calcium 8.9 - 10.3 mg/dL 10.0 10.7(H) 10.4(H)  Total Protein 6.5 - 8.1 g/dL 6.7 7.3 -  Total Bilirubin 0.3 - 1.2 mg/dL <0.2(L) 0.3 -  Alkaline Phos 38 - 126 U/L 114 85 -  AST 15 - 41 U/L 10(L) 19 -  ALT 0 - 44 U/L 18 14 -      RADIOGRAPHIC STUDIES: I have personally reviewed the radiological images as listed and agreed with the findings in the report. No results found.   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 Deboy appears stable. She completed 1 cycle adjuvant AC with Onpro. She tolerated well except 4-5 days of mild-mod fatigue and diarrhea. We reviewed symptom management with imodium, I prescribed for her today.  -She has recovered well. VS, weight stable.  -Labs with mild  leukocytosis likely secondary to neulasta, otherwise stable and adequate for treatment -She will proceed with cycle 2 today -She will return in 2 weeks for f/u and cycle 3  2. Hypercalcemia  -She is not on calcium supplement. I encouraged her to avoid calcium supplement or increased dairy products, and to increase po hydration, she agrees.  -Ca normal today, 10.0  3. COPD, HTN -She will continue her medication, and follow-up with primary care physician. -BP in normal range today  4.Smoking cessation -she reports she does not smoke  Plan -Labs reviewed, proceed with adjuvant cycle 2 AC with Onpro -Begin imodium PRN for diarrhea, prescription sent today -f/u in 2 weeks with cycle 3  All questions were answered. The patient knows to call the clinic with any problems, questions or concerns. No barriers to learning was detected.     Alla Feeling, NP 07/27/18

## 2018-08-03 ENCOUNTER — Other Ambulatory Visit: Payer: Medicaid Other

## 2018-08-03 ENCOUNTER — Ambulatory Visit: Payer: Medicaid Other | Admitting: Hematology

## 2018-08-03 ENCOUNTER — Ambulatory Visit: Payer: Medicaid Other

## 2018-08-10 ENCOUNTER — Telehealth: Payer: Self-pay | Admitting: Nurse Practitioner

## 2018-08-10 ENCOUNTER — Encounter: Payer: Self-pay | Admitting: Nurse Practitioner

## 2018-08-10 ENCOUNTER — Encounter: Payer: Self-pay | Admitting: Hematology

## 2018-08-10 ENCOUNTER — Inpatient Hospital Stay: Payer: Medicaid Other

## 2018-08-10 ENCOUNTER — Other Ambulatory Visit: Payer: Self-pay | Admitting: Hematology

## 2018-08-10 ENCOUNTER — Telehealth: Payer: Self-pay

## 2018-08-10 ENCOUNTER — Inpatient Hospital Stay (HOSPITAL_BASED_OUTPATIENT_CLINIC_OR_DEPARTMENT_OTHER): Payer: Medicaid Other | Admitting: Nurse Practitioner

## 2018-08-10 VITALS — BP 122/77 | HR 115 | Temp 98.4°F | Resp 18 | Ht 66.0 in | Wt 138.3 lb

## 2018-08-10 DIAGNOSIS — J449 Chronic obstructive pulmonary disease, unspecified: Secondary | ICD-10-CM

## 2018-08-10 DIAGNOSIS — C50112 Malignant neoplasm of central portion of left female breast: Secondary | ICD-10-CM

## 2018-08-10 DIAGNOSIS — Z171 Estrogen receptor negative status [ER-]: Principal | ICD-10-CM

## 2018-08-10 DIAGNOSIS — I10 Essential (primary) hypertension: Secondary | ICD-10-CM | POA: Diagnosis not present

## 2018-08-10 DIAGNOSIS — Z5111 Encounter for antineoplastic chemotherapy: Secondary | ICD-10-CM | POA: Diagnosis not present

## 2018-08-10 DIAGNOSIS — Z95828 Presence of other vascular implants and grafts: Secondary | ICD-10-CM

## 2018-08-10 DIAGNOSIS — D493 Neoplasm of unspecified behavior of breast: Secondary | ICD-10-CM

## 2018-08-10 DIAGNOSIS — R197 Diarrhea, unspecified: Secondary | ICD-10-CM

## 2018-08-10 LAB — CBC WITH DIFFERENTIAL (CANCER CENTER ONLY)
Abs Immature Granulocytes: 0.12 10*3/uL — ABNORMAL HIGH (ref 0.00–0.07)
Basophils Absolute: 0 10*3/uL (ref 0.0–0.1)
Basophils Relative: 1 %
EOS ABS: 0 10*3/uL (ref 0.0–0.5)
EOS PCT: 0 %
HEMATOCRIT: 28.7 % — AB (ref 36.0–46.0)
Hemoglobin: 8.8 g/dL — ABNORMAL LOW (ref 12.0–15.0)
Immature Granulocytes: 1 %
LYMPHS ABS: 0.8 10*3/uL (ref 0.7–4.0)
LYMPHS PCT: 10 %
MCH: 27.7 pg (ref 26.0–34.0)
MCHC: 30.7 g/dL (ref 30.0–36.0)
MCV: 90.3 fL (ref 80.0–100.0)
Monocytes Absolute: 1.3 10*3/uL — ABNORMAL HIGH (ref 0.1–1.0)
Monocytes Relative: 14 %
NRBC: 0 % (ref 0.0–0.2)
Neutro Abs: 6.6 10*3/uL (ref 1.7–7.7)
Neutrophils Relative %: 74 %
Platelet Count: 299 10*3/uL (ref 150–400)
RBC: 3.18 MIL/uL — ABNORMAL LOW (ref 3.87–5.11)
RDW: 12.9 % (ref 11.5–15.5)
WBC: 8.8 10*3/uL (ref 4.0–10.5)

## 2018-08-10 LAB — CMP (CANCER CENTER ONLY)
ALK PHOS: 99 U/L (ref 38–126)
ALT: 11 U/L (ref 0–44)
AST: 12 U/L — AB (ref 15–41)
Albumin: 3.3 g/dL — ABNORMAL LOW (ref 3.5–5.0)
Anion gap: 8 (ref 5–15)
BUN: 10 mg/dL (ref 8–23)
CHLORIDE: 103 mmol/L (ref 98–111)
CO2: 30 mmol/L (ref 22–32)
CREATININE: 1.16 mg/dL — AB (ref 0.44–1.00)
Calcium: 10.1 mg/dL (ref 8.9–10.3)
GFR, EST AFRICAN AMERICAN: 57 mL/min — AB (ref >60)
GFR, EST NON AFRICAN AMERICAN: 49 mL/min — AB (ref >60)
Glucose, Bld: 120 mg/dL — ABNORMAL HIGH (ref 70–99)
Potassium: 4.1 mmol/L (ref 3.5–5.1)
Sodium: 141 mmol/L (ref 135–145)
Total Bilirubin: <0.2 mg/dL — ABNORMAL LOW (ref 0.3–1.2)
Total Protein: 6.7 g/dL (ref 6.5–8.1)

## 2018-08-10 MED ORDER — PALONOSETRON HCL INJECTION 0.25 MG/5ML
INTRAVENOUS | Status: AC
  Filled 2018-08-10: qty 5

## 2018-08-10 MED ORDER — SODIUM CHLORIDE 0.9% FLUSH
10.0000 mL | INTRAVENOUS | Status: DC | PRN
  Administered 2018-08-10: 10 mL
  Filled 2018-08-10: qty 10

## 2018-08-10 MED ORDER — SODIUM CHLORIDE 0.9 % IV SOLN
Freq: Once | INTRAVENOUS | Status: AC
  Administered 2018-08-10: 13:00:00 via INTRAVENOUS
  Filled 2018-08-10: qty 5

## 2018-08-10 MED ORDER — SODIUM CHLORIDE 0.9 % IV SOLN
600.0000 mg/m2 | Freq: Once | INTRAVENOUS | Status: AC
  Administered 2018-08-10: 1040 mg via INTRAVENOUS
  Filled 2018-08-10: qty 52

## 2018-08-10 MED ORDER — PALONOSETRON HCL INJECTION 0.25 MG/5ML
0.2500 mg | Freq: Once | INTRAVENOUS | Status: AC
  Administered 2018-08-10: 0.25 mg via INTRAVENOUS

## 2018-08-10 MED ORDER — DOXORUBICIN HCL CHEMO IV INJECTION 2 MG/ML
60.0000 mg/m2 | Freq: Once | INTRAVENOUS | Status: AC
  Administered 2018-08-10: 104 mg via INTRAVENOUS
  Filled 2018-08-10: qty 52

## 2018-08-10 MED ORDER — SODIUM CHLORIDE 0.9 % IV SOLN
Freq: Once | INTRAVENOUS | Status: AC
  Administered 2018-08-10: 13:00:00 via INTRAVENOUS
  Filled 2018-08-10: qty 250

## 2018-08-10 MED ORDER — PEGFILGRASTIM 6 MG/0.6ML ~~LOC~~ PSKT
6.0000 mg | PREFILLED_SYRINGE | Freq: Once | SUBCUTANEOUS | Status: AC
  Administered 2018-08-10: 6 mg via SUBCUTANEOUS

## 2018-08-10 MED ORDER — PEGFILGRASTIM 6 MG/0.6ML ~~LOC~~ PSKT
PREFILLED_SYRINGE | SUBCUTANEOUS | Status: AC
  Filled 2018-08-10: qty 0.6

## 2018-08-10 MED ORDER — HEPARIN SOD (PORK) LOCK FLUSH 100 UNIT/ML IV SOLN
500.0000 [IU] | Freq: Once | INTRAVENOUS | Status: AC | PRN
  Administered 2018-08-10: 500 [IU]
  Filled 2018-08-10: qty 5

## 2018-08-10 NOTE — Progress Notes (Signed)
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  Patient approved for the grant. Discussed expenses and how they are covered. Gave her a copy of the approval letter as well as the expense sheet along with the Outpatient pharmacy information. She received a gas card today from her grant. She has my card for any additional financial questions or concerns.

## 2018-08-10 NOTE — Telephone Encounter (Signed)
Called patient to come to infusion.  She was sitting in the lobby. Jennifer Johns

## 2018-08-10 NOTE — Telephone Encounter (Signed)
Pt sched with APP on days due to Y[facility] sched. Gave pt avs and calendar

## 2018-08-10 NOTE — Patient Instructions (Signed)

## 2018-08-10 NOTE — Progress Notes (Signed)
[place]  [phone] [phone]  Clinic Follow up Note   Patient Care Team: [facility] as PCP - General Coralie Keens, MD as Consulting Physician (General Surgery) Truitt Merle, MD as Consulting Physician (Hematology) 08/10/2018  SUMMARY OF ONCOLOGIC HISTORY:   Cancer of central portion of left female breast ([place])   04/28/2018 Cancer Staging    Staging form: Breast, AJCC 8th Edition - Clinical stage from 04/28/2018: Stage IB (cT1c, cN0, cM0, G3, ER-, PR-, HER2-) - Signed by Truitt Merle, MD on 05/24/2018    05/24/2018 Initial Diagnosis    Cancer of central portion of left female breast ([place])    06/02/2018 Cancer Staging    Staging form: Breast, AJCC 8th Edition - Pathologic stage from 06/02/2018: Stage IIA (pT2, pN0, cM0, G3, ER-, PR-, HER2-) - Signed by Truitt Merle, MD on 06/23/2018    07/06/2018 -  Chemotherapy    The patient had DOXOrubicin (ADRIAMYCIN) chemo injection 104 mg, 60 mg/m2 = 104 mg, Intravenous,  Once, 3 of 4 cycles Administration: 104 mg (07/13/2018), 104 mg (07/27/2018) palonosetron (ALOXI) injection 0.25 mg, 0.25 mg, Intravenous,  Once, 3 of 4 cycles Administration: 0.25 mg (07/13/2018), 0.25 mg (07/27/2018) pegfilgrastim (NEULASTA ONPRO KIT) injection 6 mg, 6 mg, Subcutaneous, Once, 3 of 4 cycles Administration: 6 mg (07/13/2018), 6 mg (07/27/2018) cyclophosphamide (CYTOXAN) 1,040 mg in sodium chloride 0.9 % 250 mL chemo infusion, 600 mg/m2 = 1,040 mg, Intravenous,  Once, 3 of 4 cycles Administration: 1,040 mg (07/13/2018), 1,040 mg (07/27/2018) PACLitaxel (TAXOL) 138 mg in sodium chloride 0.9 % 250 mL chemo infusion (</= 35m/m2), 80 mg/m2, Intravenous,  Once, 0 of 12 cycles fosaprepitant (EMEND) 150 mg, dexamethasone (DECADRON) 12 mg in sodium chloride 0.9 % 145 mL IVPB, , Intravenous,  Once, 3 of 4 cycles Administration:  (07/13/2018),  (07/27/2018)  for chemotherapy treatment.     07/11/2018 Imaging   Bone scan IMPRESSION: No scintigraphic evidence of osseous metastatic disease.   CURRENT THERAPY:Adjuvant chemo AC q2 weeks x4 cycles followed by taxol q2 weeks x4, possible addition of carboplatin with taxol; adjuvant chemo starting 07/13/18  INTERVAL HISTORY: Ms. BCortopassireturns for follow up and cycle 3 AC as scheduled. She completed cycle 2 on 11/7. She reports this cycle went better. She had less fatigue, lasting 1 day. Her taste is decreased, denies mucositis. She had less diarrhea, up to 2 episodes per day for 2 days after chemo. Denies n/v/c. She feels cold often but denies fever or chills; denies cough, chest pain, dyspnea, edema, or neuropathy. Denies hematuria. She has intermittent but stable left side pain, present prior to treatment.    MEDICAL HISTORY:  Past Medical History:  Diagnosis Date  . Asthma   . [place]    Left Breast  . [place] disease[place]   . Hypertension     SURGICAL HISTORY: Past Surgical History:  Procedure Laterality Date  . COLONOSCOPY N/A 03/18/2016   Procedure: COLONOSCOPY;  Surgeon: SDanie Binder MD;  [facility];  Service: Endoscopy;  Laterality: N/A;  10:30 AM  . PARTIAL MASTECTOMY WITH AXILLARY SENTINEL LYMPH NODE BIOPSY Left 06/07/2018   Procedure: LEFT BREAST PARTIAL MASTECTOMY WITH AXILLARY SENTINEL LYMPH NODE BIOPSY;  Surgeon: BCoralie Keens MD;  [place]  Service: General;  Laterality: Left;  . PORTACATH PLACEMENT N/A 06/07/2018   Procedure: INSERTION PORT-A-CATH;  Surgeon: BCoralie Keens MD;  [place] [facility]  Service: General;  Laterality: N/A;  I have reviewed the social history and family history with the patient and they are unchanged from previous note.  ALLERGIES:  is allergic to carrot [daucus carota] and other.  MEDICATIONS:  Current Outpatient Medications  Medication Sig Dispense Refill  . albuterol (ACCUNEB) 1.25 MG/3ML nebulizer solution Take 1 ampule by  nebulization every 6 (six) hours as needed for wheezing or shortness of breath.     Marland Kitchen albuterol (PROVENTIL HFA;VENTOLIN HFA) 108 (90 Base) MCG/ACT inhaler Inhale 2 puffs into the lungs every 6 (six) hours as needed for wheezing or shortness of breath.     . cholecalciferol (VITAMIN D) 1000 units tablet Take 1,000 Units by mouth daily.   1  . hydrochlorothiazide (MICROZIDE) 12.5 MG capsule Take 12.5 mg by mouth daily.    Marland Kitchen lidocaine-prilocaine (EMLA) cream Apply to affected area once 30 g 3  . lisinopril (PRINIVIL,ZESTRIL) 10 MG tablet Take 10 mg by mouth daily.    Marland Kitchen loperamide (IMODIUM) 2 MG capsule Take 2 tabs with first watery stool then 1 tab after each subsequent watery stool. Do not exceed 8 tabs per day 40 capsule 1  . ondansetron (ZOFRAN) 8 MG tablet Take 1 tablet (8 mg total) by mouth 2 (two) times daily as needed. Start on the third day after chemotherapy. 30 tablet 1  . prochlorperazine (COMPAZINE) 10 MG tablet Take 1 tablet (10 mg total) by mouth every 6 (six) hours as needed (Nausea or vomiting). 30 tablet 1  . ranitidine (ZANTAC) 150 MG tablet Take 150 mg by mouth daily.   1  . oxyCODONE (OXY IR/ROXICODONE) 5 MG immediate release tablet Take 1 tablet (5 mg total) by mouth every 6 (six) hours as needed for moderate pain or severe pain. 30 tablet 0   No current facility-administered medications for this visit.    Facility-Administered Medications Ordered in Other Visits  Medication Dose Route Frequency Provider Last Rate Last Dose  . 0.9 %  sodium chloride infusion   Intravenous Once Truitt Merle, MD      . cyclophosphamide (CYTOXAN) 1,040 mg in sodium chloride 0.9 % 250 mL chemo infusion  600 mg/m2 (Treatment Plan Recorded) Intravenous Once Truitt Merle, MD      . DOXOrubicin (ADRIAMYCIN) chemo injection 104 mg  60 mg/m2 (Treatment Plan Recorded) Intravenous Once Truitt Merle, MD      . fosaprepitant (EMEND) 150 mg, dexamethasone (DECADRON) 12 mg in sodium chloride 0.9 % 145 mL IVPB    Intravenous Once Truitt Merle, MD      . heparin lock flush 100 unit/mL  500 Units Intracatheter Once PRN Truitt Merle, MD      . palonosetron (ALOXI) injection 0.25 mg  0.25 mg Intravenous Once Truitt Merle, MD      . sodium chloride flush (NS) 0.9 % injection 10 mL  10 mL Intracatheter PRN Truitt Merle, MD        PHYSICAL EXAMINATION: ECOG PERFORMANCE STATUS: 0 - Asymptomatic  Vitals:   08/10/18 1109  BP: 122/77  Pulse: (!) 115  Resp: 18  Temp: 98.4 F (36.9 C)  SpO2: 96%   Filed Weights   08/10/18 1109  Weight: 138 lb 4.8 oz (62.7 kg)    GENERAL:alert, no distress and comfortable SKIN: no rashes or significant lesions EYES:  sclera clear OROPHARYNX:no thrush or ulcers LYMPH:  no palpable cervical lymphadenopathy LUNGS: distant breath sounds with normal breathing effort HEART: regular rate & rhythm, no lower extremity edema ABDOMEN:abdomen soft, non-tender and normal bowel sounds NEURO: alert &  oriented x 3 with fluent speech, no focal motor/sensory deficits PAC without erythema Left breast incision is healing well, complete breast exam deferred    LABORATORY DATA:  I have reviewed the data as listed CBC Latest Ref Rng & Units 08/10/2018 07/27/2018 07/13/2018  WBC 4.0 - 10.5 K/uL 8.8 13.6(H) 7.2  Hemoglobin 12.0 - 15.0 g/dL 8.8(L) 10.0(L) 11.6(L)  Hematocrit 36.0 - 46.0 % 28.7(L) 32.2(L) 37.2  Platelets 150 - 400 K/uL 299 299 214     CMP Latest Ref Rng & Units 08/10/2018 07/27/2018 07/13/2018  Glucose 70 - 99 mg/dL 120(H) 112(H) 67(L)  BUN 8 - 23 mg/dL 10 11 18   Creatinine 0.44 - 1.00 mg/dL 1.16(H) 0.90 1.02(H)  Sodium 135 - 145 mmol/L 141 140 141  Potassium 3.5 - 5.1 mmol/L 4.1 4.0 4.0  Chloride 98 - 111 mmol/L 103 110 105  CO2 22 - 32 mmol/L 30 25 28   Calcium 8.9 - 10.3 mg/dL 10.1 10.0 10.7(H)  Total Protein 6.5 - 8.1 g/dL 6.7 6.7 7.3  Total Bilirubin 0.3 - 1.2 mg/dL <0.2(L) <0.2(L) 0.3  Alkaline Phos 38 - 126 U/L 99 114 85  AST 15 - 41 U/L 12(L) 10(L) 19  ALT 0 - 44  U/L 11 18 14       RADIOGRAPHIC STUDIES: I have personally reviewed the radiological images as listed and agreed with the findings in the report. No results found.   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 Matousek appears stable. She completed 2 cycles adjuvant AC with Onpro. She tolerated cycle 2 better, with less fatigue and diarrhea. She knows to use imodium if diarrhea worsens.   -VS, weight stable; CBC and CMP adequate for treatment. Hgb dropped to 8.8, but she is asymptomatic. Cr. 1.16, I encouraged her to drink more.  -She will proceed with cycle 3 AC with Onpro -She will return in 2 weeks for f/u and cycle 4; after which she will proceed with taxol q2 weeks x4 cycles (Dr. Burr Medico may add carboplatin).   2. Hypercalcemia -She is not on calcium supplement. I encouraged her to avoid calcium supplement or increased dairy products, and to increase po hydration, she agrees. -Ca normal today, 10.0  3. COPD, HTN -She will continue her medication, and follow-up with primary care physician. -BP in normal range today  4.Smoking cessation -she reports she does not smoke  Plan -Labs reviewed -Proceed with cycle 3 adjuvant AC with Onpro -F/u in 2 weeks with cycle 4   All questions were answered. The patient knows to call the clinic with any problems, questions or concerns. No barriers to learning was detected.     Alla Feeling, NP 08/10/18

## 2018-08-10 NOTE — Patient Instructions (Addendum)
[facility] Discharge Instructions for Patients Receiving Chemotherapy  Today you received the following chemotherapy agents: Doxorubicin (Adriamycin) and Cyclophosphamide (Cytoxan).  To help prevent nausea and vomiting after your treatment, we encourage you to take your nausea medication as directed. Received Aloxi during treatment today-->Take Compazine (not Zofran) for the next 3 days as needed.    If you develop nausea and vomiting that is not controlled by your nausea medication, call the clinic.   BELOW ARE SYMPTOMS THAT SHOULD BE REPORTED IMMEDIATELY:  *FEVER GREATER THAN 100.5 F  *CHILLS WITH OR WITHOUT FEVER  NAUSEA AND VOMITING THAT IS NOT CONTROLLED WITH YOUR NAUSEA MEDICATION  *UNUSUAL SHORTNESS OF BREATH  *UNUSUAL BRUISING OR BLEEDING  TENDERNESS IN MOUTH AND THROAT WITH OR WITHOUT PRESENCE OF ULCERS  *URINARY PROBLEMS  *BOWEL PROBLEMS  UNUSUAL RASH Items with * indicate a potential emergency and should be followed up as soon as possible.  Feel free to call the clinic should you have any questions or concerns. The clinic phone number is [phone].  Please show the [facility] at check-in to the Emergency Department and triage nurse.  Pegfilgrastim injection What is this medicine? PEGFILGRASTIM (PEG fil gra stim) is a long-acting granulocyte colony-stimulating factor that stimulates the growth of neutrophils, a type of white blood cell important in the body's fight against infection. It is used to reduce the incidence of fever and infection in patients with certain types of cancer who are receiving chemotherapy that affects the bone marrow, and to increase survival after being exposed to high doses of radiation. This medicine may be used for other purposes; ask your health care provider or pharmacist if you have questions. COMMON BRAND NAME(S): Neulasta What should I tell my health care provider before I take this medicine? They need to know  if you have any of these conditions: -kidney disease -latex allergy -ongoing radiation therapy -sickle cell disease -skin reactions to acrylic adhesives (On-Body Injector only) -an unusual or allergic reaction to pegfilgrastim, filgrastim, other medicines, foods, dyes, or preservatives -pregnant or trying to get pregnant -breast-feeding How should I use this medicine? This medicine is for injection under the skin. If you get this medicine at home, you will be taught how to prepare and give the pre-filled syringe or how to use the On-body Injector. Refer to the patient Instructions for Use for detailed instructions. Use exactly as directed. Tell your healthcare provider immediately if you suspect that the On-body Injector may not have performed as intended or if you suspect the use of the On-body Injector resulted in a missed or partial dose. It is important that you put your used needles and syringes in a special sharps container. Do not put them in a trash can. If you do not have a sharps container, call your pharmacist or healthcare provider to get one. Talk to your pediatrician regarding the use of this medicine in children. While this drug may be prescribed for selected conditions, precautions do apply. Overdosage: If you think you have taken too much of this medicine contact a poison control center or emergency room at once. NOTE: This medicine is only for you. Do not share this medicine with others. What if I miss a dose? It is important not to miss your dose. Call your doctor or health care professional if you miss your dose. If you miss a dose due to an On-body Injector failure or leakage, a new dose should be administered as soon as possible using a  single prefilled syringe for manual use. What may interact with this medicine? Interactions have not been studied. Give your health care provider a list of all the medicines, herbs, non-prescription drugs, or dietary supplements you use. Also  tell them if you smoke, drink alcohol, or use illegal drugs. Some items may interact with your medicine. This list may not describe all possible interactions. Give your health care provider a list of all the medicines, herbs, non-prescription drugs, or dietary supplements you use. Also tell them if you smoke, drink alcohol, or use illegal drugs. Some items may interact with your medicine. What should I watch for while using this medicine? You may need blood work done while you are taking this medicine. If you are going to need a MRI, CT scan, or other procedure, tell your doctor that you are using this medicine (On-Body Injector only). What side effects may I notice from receiving this medicine? Side effects that you should report to your doctor or health care professional as soon as possible: -allergic reactions like skin rash, itching or hives, swelling of the face, lips, or tongue -dizziness -fever -pain, redness, or irritation at site where injected -pinpoint red spots on the skin -red or dark-brown urine -shortness of breath or breathing problems -stomach or side pain, or pain at the shoulder -swelling -tiredness -trouble passing urine or change in the amount of urine Side effects that usually do not require medical attention (report to your doctor or health care professional if they continue or are bothersome): -bone pain -muscle pain This list may not describe all possible side effects. Call your doctor for medical advice about side effects. You may report side effects to FDA at 1-800-FDA-1088. Where should I keep my medicine? Keep out of the reach of children. Store pre-filled syringes in a refrigerator between 2 and 8 degrees C (36 and 46 degrees F). Do not freeze. Keep in carton to protect from light. Throw away this medicine if it is left out of the refrigerator for more than 48 hours. Throw away any unused medicine after the expiration date. NOTE: This sheet is a summary. It may  not cover all possible information. If you have questions about this medicine, talk to your doctor, pharmacist, or health care provider.  2018 [employer]vier/Gold Standard (2016-09-02 12:58:03)

## 2018-08-11 ENCOUNTER — Telehealth: Payer: Self-pay | Admitting: Hematology

## 2018-08-11 NOTE — Telephone Encounter (Signed)
Spoke to pt regarding appts update

## 2018-08-16 ENCOUNTER — Ambulatory Visit: Payer: Medicaid Other | Admitting: Nurse Practitioner

## 2018-08-16 ENCOUNTER — Ambulatory Visit: Payer: Medicaid Other

## 2018-08-16 ENCOUNTER — Other Ambulatory Visit: Payer: Medicaid Other

## 2018-08-20 ENCOUNTER — Observation Stay (HOSPITAL_COMMUNITY)
Admission: EM | Admit: 2018-08-20 | Discharge: 2018-08-21 | Disposition: A | Discharge status: Home / Self Care | Payer: Medicaid Other | Attending: Family Medicine | Admitting: Family Medicine

## 2018-08-20 ENCOUNTER — Emergency Department (HOSPITAL_COMMUNITY): Payer: Medicaid Other

## 2018-08-20 ENCOUNTER — Encounter (HOSPITAL_COMMUNITY): Payer: Self-pay

## 2018-08-20 DIAGNOSIS — T451X5A Adverse effect of antineoplastic and immunosuppressive drugs, initial encounter: Secondary | ICD-10-CM

## 2018-08-20 DIAGNOSIS — N179 Acute kidney failure, unspecified: Secondary | ICD-10-CM | POA: Insufficient documentation

## 2018-08-20 DIAGNOSIS — K521 Toxic gastroenteritis and colitis: Principal | ICD-10-CM | POA: Insufficient documentation

## 2018-08-20 DIAGNOSIS — I10 Essential (primary) hypertension: Secondary | ICD-10-CM | POA: Diagnosis not present

## 2018-08-20 DIAGNOSIS — Z79899 Other long term (current) drug therapy: Secondary | ICD-10-CM | POA: Diagnosis not present

## 2018-08-20 DIAGNOSIS — T65891A Toxic effect of other specified substances, accidental (unintentional), initial encounter: Secondary | ICD-10-CM | POA: Insufficient documentation

## 2018-08-20 DIAGNOSIS — F17200 Nicotine dependence, unspecified, uncomplicated: Secondary | ICD-10-CM | POA: Diagnosis not present

## 2018-08-20 DIAGNOSIS — J449 Chronic obstructive pulmonary disease, unspecified: Secondary | ICD-10-CM | POA: Diagnosis not present

## 2018-08-20 DIAGNOSIS — R1084 Generalized abdominal pain: Secondary | ICD-10-CM | POA: Diagnosis present

## 2018-08-20 DIAGNOSIS — J45909 Unspecified asthma, uncomplicated: Secondary | ICD-10-CM | POA: Diagnosis not present

## 2018-08-20 DIAGNOSIS — R197 Diarrhea, unspecified: Secondary | ICD-10-CM | POA: Diagnosis present

## 2018-08-20 DIAGNOSIS — D649 Anemia, unspecified: Secondary | ICD-10-CM

## 2018-08-20 LAB — POC OCCULT BLOOD, ED: Fecal Occult Bld: NEGATIVE

## 2018-08-20 LAB — I-STAT CHEM 8, ED
BUN: 23 mg/dL (ref 8–23)
Calcium, Ion: 1.35 mmol/L (ref 1.15–1.40)
Chloride: 105 mmol/L (ref 98–111)
Creatinine, Ser: 1.2 mg/dL — ABNORMAL HIGH (ref 0.44–1.00)
Glucose, Bld: 92 mg/dL (ref 70–99)
HCT: 22 % — ABNORMAL LOW (ref 36.0–46.0)
Hemoglobin: 7.5 g/dL — ABNORMAL LOW (ref 12.0–15.0)
Potassium: 4.3 mmol/L (ref 3.5–5.1)
Sodium: 137 mmol/L (ref 135–145)
TCO2: 29 mmol/L (ref 22–32)

## 2018-08-20 LAB — I-STAT CG4 LACTIC ACID, ED: Lactic Acid, Venous: 0.61 mmol/L (ref 0.5–1.9)

## 2018-08-20 LAB — CBC
HCT: 23.9 % — ABNORMAL LOW (ref 36.0–46.0)
Hemoglobin: 7.2 g/dL — ABNORMAL LOW (ref 12.0–15.0)
MCH: 27.9 pg (ref 26.0–34.0)
MCHC: 30.1 g/dL (ref 30.0–36.0)
MCV: 92.6 fL (ref 80.0–100.0)
Platelets: 181 10*3/uL (ref 150–400)
RBC: 2.58 MIL/uL — ABNORMAL LOW (ref 3.87–5.11)
RDW: 13.2 % (ref 11.5–15.5)
WBC: 4.4 10*3/uL (ref 4.0–10.5)
nRBC: 0 % (ref 0.0–0.2)

## 2018-08-20 LAB — LIPASE, BLOOD: Lipase: 30 U/L (ref 11–51)

## 2018-08-20 LAB — COMPREHENSIVE METABOLIC PANEL
ALT: 16 U/L (ref 0–44)
AST: 14 U/L — ABNORMAL LOW (ref 15–41)
Albumin: 3.2 g/dL — ABNORMAL LOW (ref 3.5–5.0)
Alkaline Phosphatase: 68 U/L (ref 38–126)
Anion gap: 8 (ref 5–15)
BUN: 24 mg/dL — ABNORMAL HIGH (ref 8–23)
CO2: 25 mmol/L (ref 22–32)
Calcium: 9.4 mg/dL (ref 8.9–10.3)
Chloride: 103 mmol/L (ref 98–111)
Creatinine, Ser: 1.32 mg/dL — ABNORMAL HIGH (ref 0.44–1.00)
GFR calc Af Amer: 50 mL/min — ABNORMAL LOW (ref >60)
GFR calc non Af Amer: 43 mL/min — ABNORMAL LOW (ref >60)
Glucose, Bld: 99 mg/dL (ref 70–99)
Potassium: 4.2 mmol/L (ref 3.5–5.1)
Sodium: 136 mmol/L (ref 135–145)
Total Bilirubin: 0.2 mg/dL — ABNORMAL LOW (ref 0.3–1.2)
Total Protein: 6.4 g/dL — ABNORMAL LOW (ref 6.5–8.1)

## 2018-08-20 LAB — PREPARE RBC (CROSSMATCH)

## 2018-08-20 LAB — ABO/RH: ABO/RH(D): O POS

## 2018-08-20 MED ORDER — LOPERAMIDE HCL 2 MG PO CAPS
2.0000 mg | ORAL_CAPSULE | Freq: Once | ORAL | Status: AC
  Administered 2018-08-20: 2 mg via ORAL
  Filled 2018-08-20: qty 1

## 2018-08-20 MED ORDER — OXYCODONE-ACETAMINOPHEN 5-325 MG PO TABS: 1.0000 | ORAL_TABLET | Freq: Four times a day (QID) | ORAL | Status: DC | PRN

## 2018-08-20 MED ORDER — SODIUM CHLORIDE (PF) 0.9 % IJ SOLN
INTRAMUSCULAR | Status: AC
  Filled 2018-08-20: qty 50

## 2018-08-20 MED ORDER — IOPAMIDOL (ISOVUE-300) INJECTION 61%
INTRAVENOUS | Status: AC
  Filled 2018-08-20: qty 100

## 2018-08-20 MED ORDER — SODIUM CHLORIDE 0.9% IV SOLUTION
Freq: Once | INTRAVENOUS | Status: AC
  Administered 2018-08-20: 21:00:00 via INTRAVENOUS

## 2018-08-20 MED ORDER — ONDANSETRON HCL 4 MG/2ML IJ SOLN
4.0000 mg | Freq: Once | INTRAMUSCULAR | Status: AC
  Administered 2018-08-20: 4 mg via INTRAVENOUS
  Filled 2018-08-20: qty 2

## 2018-08-20 MED ORDER — MORPHINE SULFATE (PF) 4 MG/ML IV SOLN
4.0000 mg | Freq: Once | INTRAVENOUS | Status: AC
  Administered 2018-08-20: 4 mg via INTRAVENOUS
  Filled 2018-08-20: qty 1

## 2018-08-20 MED ORDER — IOPAMIDOL (ISOVUE-300) INJECTION 61%
100.0000 mL | Freq: Once | INTRAVENOUS | Status: AC | PRN
  Administered 2018-08-20: 100 mL via INTRAVENOUS

## 2018-08-20 MED ORDER — ONDANSETRON HCL 4 MG/2ML IJ SOLN: 4.0000 mg | Freq: Four times a day (QID) | INTRAMUSCULAR | Status: DC | PRN

## 2018-08-20 MED ORDER — PROCHLORPERAZINE MALEATE 10 MG PO TABS: 10.0000 mg | ORAL_TABLET | Freq: Four times a day (QID) | ORAL | Status: DC | PRN

## 2018-08-20 MED ORDER — LOPERAMIDE HCL 2 MG PO CAPS: 2.0000 mg | ORAL_CAPSULE | Freq: Four times a day (QID) | ORAL | Status: DC | PRN

## 2018-08-20 MED ORDER — SODIUM CHLORIDE 0.9 % IV BOLUS
1000.0000 mL | Freq: Once | INTRAVENOUS | Status: AC
  Administered 2018-08-20: 1000 mL via INTRAVENOUS

## 2018-08-20 NOTE — H&P (Signed)
History and Physical  Jennifer Johns QAS:[id number] DOB: [birth year] DOA: 08/20/2018 1643  Referring physician: [facility]) PCP: [facility]: oncology Junius Finner)  HISTORY   Chief Complaint: diarrhea and fatigue  HPI: Jennifer Johns is a 62 y.o. female with hx of HTN, mild COPD, and recently diagnosed L breast CA stage IIA on chemo who recently had cycle 3 of adjuvant chemotherapy with doxorubicin + cytoxan on 08/11/18 infusion and presents with recurrent watery (non-bloody) diarrhea x 5-6 days. Patient has noted similar watery diarrhea with previous chemo cycles and has been prescribed imodium for symptomatic diarrhea. Since the 08/11/18 infusion, she experienced lower abdominal discomfort post prandially and watery diarrhea -- initially improved with home imodium. However with Thanksgiving, patient had heavier meals than usual and since then has had recurrent diarrhea with associated fatigue. Last bowel movement was this afternoon around 2:30 pm described as pieces of undigested "salad" and watery. No blood, tarry or melanotic stools.  Denies nausea or vomiting. No fevers/chills. No sick contacts and no family members with GI illness.    Review of Systems:  + diarrhea (recurrent) non bloody; fatigue x 1 week - no fevers/chills - no cough - no chest pain, dyspnea on exertion - no edema, PND, orthopnea - no nausea/vomiting - no dysuria, increased urinary frequency - no weight changes Rest of systems reviewed are negative, except as per above history.   ED course:  Vitals Blood pressure 109/76, pulse (!) 102, temperature 99.8 F (37.7 C), temperature source Rectal, resp. rate 18, SpO2 95 %. Received NS bolus x 2L; zofran 4mg  IV x 1; morphine 4mg  X 1; pRBC x 1 unit transfusion. CT abdomen pelvis showed no acute GI abnormalities.    Past Medical History:  Diagnosis Date  . Asthma   . Cancer ([place])    Left Breast  . COPD  (chronic obstructive pulmonary disease) ([place])   . Hypertension    Past Surgical History:  Procedure Laterality Date  . COLONOSCOPY N/A 03/18/2016   Procedure: COLONOSCOPY;  Surgeon: Danie Binder, MD;  [facility];  Service: Endoscopy;  Laterality: N/A;  10:30 AM  . PARTIAL MASTECTOMY WITH AXILLARY SENTINEL LYMPH NODE BIOPSY Left 06/07/2018   Procedure: LEFT BREAST PARTIAL MASTECTOMY WITH AXILLARY SENTINEL LYMPH NODE BIOPSY;  Surgeon: Coralie Keens, MD;  Location: [facility];  Service: General;  Laterality: Left;  . PORTACATH PLACEMENT N/A 06/07/2018   Procedure: INSERTION PORT-A-CATH;  Surgeon: Coralie Keens, MD;  Location: [facility];  Service: General;  Laterality: N/A;    Social History:  reports that she has been smoking. She has a 22.50 pack-year smoking history. She has never used smokeless tobacco. She reports that she does not drink alcohol or use drugs.  Allergies  Allergen Reactions  . Carrot [Daucus Carota] Hives and Shortness Of Breath  . Other Hives    "chitlins" or pig intestine    Family History  Problem Relation Age of Onset  . Cancer Mother 34       breast cancer  . Cancer Father 63       prostate cancer      Prior to Admission medications   Medication Sig Start Date End Date Taking? Authorizing Provider  albuterol (ACCUNEB) 1.25 MG/3ML nebulizer solution Take 1 ampule by nebulization every 6 (six) hours as needed for wheezing or shortness of breath.    Yes [provider]  albuterol (PROVENTIL HFA;VENTOLIN HFA)  108 (90 Base) MCG/ACT inhaler Inhale 2 puffs into the lungs every 6 (six) hours as needed for wheezing or shortness of breath.    Yes [provider]  cholecalciferol (VITAMIN D) 1000 units tablet Take 1,000 Units by mouth daily.  04/22/18  Yes [provider]  hydrochlorothiazide (MICROZIDE) 12.5 MG capsule Take 12.5 mg by mouth daily.   Yes [provider]  lidocaine-prilocaine (EMLA) cream Apply to affected  area once 06/27/18  Yes Truitt Merle, MD  lisinopril (PRINIVIL,ZESTRIL) 10 MG tablet Take 10 mg by mouth daily.   Yes [provider]  loperamide (IMODIUM) 2 MG capsule Take 2 tabs with first watery stool then 1 tab after each subsequent watery stool. Do not exceed 8 tabs per day 07/27/18  Yes Alla Feeling, NP  ondansetron (ZOFRAN) 8 MG tablet Take 1 tablet (8 mg total) by mouth 2 (two) times daily as needed. Start on the third day after chemotherapy. 06/27/18  Yes Truitt Merle, MD  oxyCODONE (OXY IR/ROXICODONE) 5 MG immediate release tablet Take 1 tablet (5 mg total) by mouth every 6 (six) hours as needed for moderate pain or severe pain. 06/07/18  Yes Coralie Keens, MD  prochlorperazine (COMPAZINE) 10 MG tablet Take 1 tablet (10 mg total) by mouth every 6 (six) hours as needed (Nausea or vomiting). 06/27/18  Yes Truitt Merle, MD  ranitidine (ZANTAC) 150 MG tablet Take 150 mg by mouth daily.  04/12/18  Yes [provider]    PHYSICAL EXAM   Temp:  [98.3 F (36.8 C)-99.8 F (37.7 C)] 99.8 F (37.7 C) (12/01 1719) Pulse Rate:  [102-120] 102 (12/01 1953) Resp:  [17-20] 18 (12/01 1953) BP: (96-114)/(61-76) 109/76 (12/01 1953) SpO2:  [93 %-97 %] 95 % (12/01 1953)  BP 109/76   Pulse (!) 102   Temp 99.8 F (37.7 C) (Rectal)   Resp 18   SpO2 95%    GEN well-nourished elderly african-american female; resting in bed  HEENT NCAT EOM intact PERRL; clear oropharynx, no cervical LAD; dry mucus membranes  JVP estimated 4 cm H2O above RA; no HJR ; no carotid bruits b/l ;  CV regular normal rate; normal S1 and S2; no m/r/g or S3/S4; PMI non displaced; no parasternal heave.  R upper chest port in place: dressing clean/dry/intact  RESP CTA b/l; breathing unlabored and symmetric  ABD soft NT ND +normoactive BS  EXT warm throughout b/l; no peripheral edema b/l  PULSES  DP and radials 2+ intact b/l  SKIN/MSK no rashes or lesions  NEURO/PSYCH AAOx4; no focal deficits   DATA   LABS ON  ADMISSION:  Basic Metabolic Panel: Recent Labs  Lab 08/20/18 1642 08/20/18 1809  NA 136 137  K 4.2 4.3  CL 103 105  CO2 25  --   GLUCOSE 99 92  BUN 24* 23  CREATININE 1.32* 1.20*  CALCIUM 9.4  --    CBC: Recent Labs  Lab 08/20/18 1642 08/20/18 1809  WBC 4.4  --   HGB 7.2* 7.5*  HCT 23.9* 22.0*  MCV 92.6  --   PLT 181  --    Liver Function Tests: Recent Labs  Lab 08/20/18 1642  AST 14*  ALT 16  ALKPHOS 68  BILITOT 0.2*  PROT 6.4*  ALBUMIN 3.2*   Recent Labs  Lab 08/20/18 1642  LIPASE 30   No results for input(s): AMMONIA in the last 168 hours. Coagulation:  No results found for: INR, PROTIME No results found for: PTT Lactic  Acid, Venous:     Component Value Date/Time   LATICACIDVEN 0.61 08/20/2018 1806   Cardiac Enzymes: No results for input(s): CKTOTAL, CKMB, CKMBINDEX, TROPONINI in the last 168 hours. Urinalysis: No results found for: COLORURINE, APPEARANCEUR, LABSPEC, PHURINE, GLUCOSEU, HGBUR, BILIRUBINUR, KETONESUR, PROTEINUR, UROBILINOGEN, NITRITE, LEUKOCYTESUR  BNP (last 3 results) No results for input(s): PROBNP in the last 8760 hours. CBG: No results for input(s): GLUCAP in the last 168 hours.  Radiological Exams on Admission: Ct Abdomen Pelvis W Contrast  Result Date: 08/20/2018 CLINICAL DATA:  Weakness with generalized abdominal pain, diarrhea and chills 4 days. Last chemotherapy treatment November 21st for breast cancer. EXAM: CT ABDOMEN AND PELVIS WITH CONTRAST TECHNIQUE: Multidetector CT imaging of the abdomen and pelvis was performed using the standard protocol following bolus administration of intravenous contrast. CONTRAST:  158mL ISOVUE-300 IOPAMIDOL (ISOVUE-300) INJECTION 61% COMPARISON:  None. FINDINGS: Lower chest: Subtle focal nodular opacification of the posterior left lower lobe. Calcified granuloma over the left lower lobe. Hepatobiliary: There are a couple subcentimeter liver hypodensities too small to characterize but likely  cysts. Gallbladder and biliary tree are normal. Pancreas: Mild dilatation of the main pancreatic duct over the pancreatic head measuring up to 6 mm. No focal mass or ductal stones identified. Spleen: Normal. Adrenals/Urinary Tract: Adrenal glands are normal. Kidneys normal in size without hydronephrosis or nephrolithiasis. Ureters and bladder are normal. Stomach/Bowel: Stomach and small bowel are within normal. Appendix is normal. Colon is normal. Vascular/Lymphatic: Calcified plaque over the abdominal aorta. No adenopathy. Reproductive: Normal. Other: No significant free fluid or focal inflammatory change. Musculoskeletal: Mild degenerative change of the spine with disc disease at the L4-5 and L5-S1 levels. IMPRESSION: No acute findings in the abdomen/pelvis. Mild prominence of the main pancreatic duct measuring 6 mm. No evidence of mass, adenopathy or ductal stones. Subtle focal nodularity over the posterior left lower lobe which may be acute or chronic and may be due to an infectious versus atypical infectious or inflammatory process. Recommend follow-up chest CT 4-6 weeks. Two subcentimeter liver hypodensities too small to characterize but likely cysts. Aortic Atherosclerosis (ICD10-I70.0). Electronically Signed   By: Marin Olp M.D.   On: 08/20/2018 19:26    EKG: Independently reviewed from pprior 06/07/18: NSR   ASSESSMENT AND PLAN   Assessment: Jennifer Johns is a 62 y.o. female with hx of HTN, mild COPD, and recently diagnosed L breast CA stage IIA on chemo who recently had cycle 3 of adjuvant chemotherapy with doxorubicin + cytoxan on 08/11/18 infusion and presents with recurrent watery (non-bloody) diarrhea, which appears to be similar to diarrhea she has had with the prior chemo cycles, and associated fatigue. Patient reports that diarrhea actually did respond to imodium initially but then worsened after consuming heavy Thanksgiving meals. Pancytopenia likely also related to cumulative effects  of chemo. No focal infectious sx. Negative guaiac in ED; is receiving pRBC transfusion in ED due to concern for symptomatic anemia. I suspect dehydration rather than anemia, but will go ahead with 1 unit and check orthostatic vitals tomorrow. Tachycardia is already improving with fluids. No findings on CT abd/pelvis to explain sx. Patient likely needs more aggressive imodium regimen at home to control diarrhea in the future.    Principal Problem:   Chemotherapy induced diarrhea Active Problems:   AKI (acute kidney injury) ([place])  Plan:   # Diarrhea, watery, with intermittently undigested food particles 1 week post chemo cycle (cytoxan + adriamycin). Likely chemo (cytoxan) induced diarrhea.  - received NS x 2L  boluses and pRBC x 1 unit - loperamide dose x 1 overnight - prn loperamide 2mg  q6h prn diarrhea - if diarrhea worsens or if new fever, do infectious GI workup including cdiff panel - orthostatic vitals in AM - resume home oxycodone 5mg  q6h prn mod-severe pain  # Pancytopenia - likely chemo induced. Not neutropenic > Hb 7.2 on admission (down from 8.8) - receiving 1 unit pRBC in ED - repeat CBC in AM - If Hb drops < 7, contact oncology to consider neulasta while inpatient   # AKI likely pre-renal due to dehydration >basleine Cr 0.9-1.1 - fluid resuscitation as above - recheck BMP in AM   # Breast CA stage 2A on chemo - followed by outpatient oncology  # Hx of HTN - holding home lisinopril and HCTZ - recommend stopping HCTZ on discharge given recurrent diarrhea with chemo cycles     DVT Prophylaxis: lovenox Code Status:  Full Code Family Communication: discussed with patient and daughter at bedside  Disposition Plan: admit to observation    Patient contact: Extended Emergency Contact Information Primary Emergency Contact: Stacie Acres Address: [address] [place] [address] [place] Phone: [phone] Mobile Phone:  [phone] Relation: Daughter  Time spent: > 35 mins  Colbert Ewing, MD [facility] Pager [phone]  If 7PM-7AM, please contact night-coverage [email or web address] Password [facility] 08/20/2018, 8:13 PM

## 2018-08-20 NOTE — ED Triage Notes (Addendum)
Patient c/o weakness, chills, generalized abdominal pain, and diarrhea X4 days.  Denies n/v  2 Occurrences of diarrhea in past 24 hrs.  Last chemo treatment November 21st.  Breast Cancer.   A/Ox4 Ambulatory in triage.

## 2018-08-20 NOTE — ED Notes (Signed)
ED Provider at bedside. 

## 2018-08-20 NOTE — ED Notes (Signed)
Patient transported to CT 

## 2018-08-20 NOTE — ED Provider Notes (Signed)
[facility] DEPT Provider Note   CSN: [id number] Arrival date & time: 08/20/18  1624     History   Chief Complaint Chief Complaint  Patient presents with  . Abdominal Pain  . Weakness    HPI Jennifer Johns is a 62 y.o. female.  62 yo F with a chief complaint of abdominal pain and weakness.  This been going on for the past 3 or 4 days.  Described as crampy associated with diarrhea.  She had a lot of turnip greens recently.  Denies bloody or dark stool.  She denies vaginal bleeding or discharge denies dysuria increased frequency or hesitancy.  She denies back pain.  Pain is gotten mildly worse over the past couple days.  She was unable to cook Thanksgiving dinner due to the pain.  The history is provided by the patient and a relative.  Abdominal Pain   This is a new problem. The current episode started more than 2 days ago. The problem occurs constantly. The problem has been gradually worsening. The pain is associated with an unknown factor. The pain is located in the suprapubic region, RLQ and LLQ. The quality of the pain is sharp, shooting and cramping. The pain is at a severity of 10/10. The pain is severe. Associated symptoms include diarrhea. Pertinent negatives include fever, nausea, vomiting, dysuria, headaches, arthralgias and myalgias. Nothing aggravates the symptoms. Nothing relieves the symptoms.  Weakness  Primary symptoms include no dizziness. Pertinent negatives include no shortness of breath, no chest pain, no vomiting and no headaches.    Past Medical History:  Diagnosis Date  . Asthma   . Cancer ([place])    Left Breast  . COPD (chronic obstructive pulmonary disease) ([place])   . Hypertension     Patient Active Problem List   Diagnosis Date Noted  . Chemotherapy induced diarrhea 08/20/2018  . AKI (acute kidney injury) ([place]) 08/20/2018  . Port-A-Cath in place 07/13/2018  . Cancer of central portion of left female breast ([place])  05/24/2018  . Special screening for malignant neoplasms, colon     Past Surgical History:  Procedure Laterality Date  . COLONOSCOPY N/A 03/18/2016   Procedure: COLONOSCOPY;  Surgeon: Danie Binder, MD;  [facility];  Service: Endoscopy;  Laterality: N/A;  10:30 AM  . PARTIAL MASTECTOMY WITH AXILLARY SENTINEL LYMPH NODE BIOPSY Left 06/07/2018   Procedure: LEFT BREAST PARTIAL MASTECTOMY WITH AXILLARY SENTINEL LYMPH NODE BIOPSY;  Surgeon: Coralie Keens, MD;  Location: [facility];  Service: General;  Laterality: Left;  . PORTACATH PLACEMENT N/A 06/07/2018   Procedure: INSERTION PORT-A-CATH;  Surgeon: Coralie Keens, MD;  Location: [facility];  Service: General;  Laterality: N/A;     OB History   None      Home Medications    Prior to Admission medications   Medication Sig Start Date End Date Taking? Authorizing Provider  albuterol (ACCUNEB) 1.25 MG/3ML nebulizer solution Take 1 ampule by nebulization every 6 (six) hours as needed for wheezing or shortness of breath.    Yes [provider]  albuterol (PROVENTIL HFA;VENTOLIN HFA) 108 (90 Base) MCG/ACT inhaler Inhale 2 puffs into the lungs every 6 (six) hours as needed for wheezing or shortness of breath.    Yes [provider]  cholecalciferol (VITAMIN D) 1000 units tablet Take 1,000 Units by mouth daily.  04/22/18  Yes [provider]  hydrochlorothiazide (MICROZIDE) 12.5 MG capsule Take 12.5 mg by mouth daily.   Yes [provider]  lidocaine-prilocaine (EMLA) cream Apply to affected area once 06/27/18  Yes Truitt Merle, MD  lisinopril (PRINIVIL,ZESTRIL) 10 MG tablet Take 10 mg by mouth daily.   Yes [provider]  loperamide (IMODIUM) 2 MG capsule Take 2 tabs with first watery stool then 1 tab after each subsequent watery stool. Do not exceed 8 tabs per day 07/27/18  Yes Alla Feeling, NP  ondansetron (ZOFRAN) 8 MG tablet Take 1 tablet (8 mg total) by mouth 2 (two) times daily as needed.  Start on the third day after chemotherapy. 06/27/18  Yes Truitt Merle, MD  oxyCODONE (OXY IR/ROXICODONE) 5 MG immediate release tablet Take 1 tablet (5 mg total) by mouth every 6 (six) hours as needed for moderate pain or severe pain. 06/07/18  Yes Coralie Keens, MD  prochlorperazine (COMPAZINE) 10 MG tablet Take 1 tablet (10 mg total) by mouth every 6 (six) hours as needed (Nausea or vomiting). 06/27/18  Yes Truitt Merle, MD  ranitidine (ZANTAC) 150 MG tablet Take 150 mg by mouth daily.  04/12/18  Yes [provider]    Family History Family History  Problem Relation Age of Onset  . Cancer Mother 55       breast cancer  . Cancer Father 41       prostate cancer    Social History Social History   Tobacco Use  . Smoking status: Current Some Day Smoker    Packs/day: 0.50    Years: 45.00    Pack years: 22.50  . Smokeless tobacco: Never Used  Substance Use Topics  . Alcohol use: No  . Drug use: No     Allergies   Carrot [daucus carota] and Other   Review of Systems Review of Systems  Constitutional: Negative for chills and fever.  HENT: Negative for congestion and rhinorrhea.   Eyes: Negative for redness and visual disturbance.  Respiratory: Negative for shortness of breath and wheezing.   Cardiovascular: Negative for chest pain and palpitations.  Gastrointestinal: Positive for abdominal pain and diarrhea. Negative for nausea and vomiting.  Genitourinary: Negative for dysuria and urgency.  Musculoskeletal: Negative for arthralgias and myalgias.  Skin: Negative for pallor and wound.  Neurological: Positive for weakness. Negative for dizziness and headaches.     Physical Exam Updated Vital Signs BP 109/76   Pulse (!) 102   Temp 99.8 F (37.7 C) (Rectal)   Resp 18   SpO2 95%   Physical Exam  Constitutional: She is oriented to person, place, and time. She appears well-developed and well-nourished. No distress.  HENT:  Head: Normocephalic and atraumatic.  Eyes:  Pupils are equal, round, and reactive to light. EOM are normal.  Neck: Normal range of motion. Neck supple.  Cardiovascular: Regular rhythm. Tachycardia present. Exam reveals no gallop and no friction rub.  No murmur heard. Pulmonary/Chest: Effort normal. She has no wheezes. She has no rales.  Abdominal: Soft. She exhibits no distension and no mass. There is tenderness. There is no guarding.  Diffuse tenderness below the umbilicus  Musculoskeletal: She exhibits no edema or tenderness.  Neurological: She is alert and oriented to person, place, and time.  Skin: Skin is warm and dry. She is not diaphoretic.  Psychiatric: She has a normal mood and affect. Her behavior is normal.  Nursing note and vitals reviewed.    ED Treatments / Results  Labs (all labs ordered are listed, but only abnormal results are displayed) Labs Reviewed  COMPREHENSIVE METABOLIC PANEL - Abnormal; Notable for the following components:  Result Value   BUN 24 (*)    Creatinine, Ser 1.32 (*)    Total Protein 6.4 (*)    Albumin 3.2 (*)    AST 14 (*)    Total Bilirubin 0.2 (*)    GFR calc non Af Amer 43 (*)    GFR calc Af Amer 50 (*)    All other components within normal limits  CBC - Abnormal; Notable for the following components:   RBC 2.58 (*)    Hemoglobin 7.2 (*)    HCT 23.9 (*)    All other components within normal limits  I-STAT CHEM 8, ED - Abnormal; Notable for the following components:   Creatinine, Ser 1.20 (*)    Hemoglobin 7.5 (*)    HCT 22.0 (*)    All other components within normal limits  LIPASE, BLOOD  URINALYSIS, ROUTINE W REFLEX MICROSCOPIC  BASIC METABOLIC PANEL  CBC  MAGNESIUM  I-STAT CG4 LACTIC ACID, ED  POC OCCULT BLOOD, ED  TYPE AND SCREEN  PREPARE RBC (CROSSMATCH)    EKG None  Radiology Ct Abdomen Pelvis W Contrast  Result Date: 08/20/2018 CLINICAL DATA:  Weakness with generalized abdominal pain, diarrhea and chills 4 days. Last chemotherapy treatment November 21st  for breast cancer. EXAM: CT ABDOMEN AND PELVIS WITH CONTRAST TECHNIQUE: Multidetector CT imaging of the abdomen and pelvis was performed using the standard protocol following bolus administration of intravenous contrast. CONTRAST:  196mL ISOVUE-300 IOPAMIDOL (ISOVUE-300) INJECTION 61% COMPARISON:  None. FINDINGS: Lower chest: Subtle focal nodular opacification of the posterior left lower lobe. Calcified granuloma over the left lower lobe. Hepatobiliary: There are a couple subcentimeter liver hypodensities too small to characterize but likely cysts. Gallbladder and biliary tree are normal. Pancreas: Mild dilatation of the main pancreatic duct over the pancreatic head measuring up to 6 mm. No focal mass or ductal stones identified. Spleen: Normal. Adrenals/Urinary Tract: Adrenal glands are normal. Kidneys normal in size without hydronephrosis or nephrolithiasis. Ureters and bladder are normal. Stomach/Bowel: Stomach and small bowel are within normal. Appendix is normal. Colon is normal. Vascular/Lymphatic: Calcified plaque over the abdominal aorta. No adenopathy. Reproductive: Normal. Other: No significant free fluid or focal inflammatory change. Musculoskeletal: Mild degenerative change of the spine with disc disease at the L4-5 and L5-S1 levels. IMPRESSION: No acute findings in the abdomen/pelvis. Mild prominence of the main pancreatic duct measuring 6 mm. No evidence of mass, adenopathy or ductal stones. Subtle focal nodularity over the posterior left lower lobe which may be acute or chronic and may be due to an infectious versus atypical infectious or inflammatory process. Recommend follow-up chest CT 4-6 weeks. Two subcentimeter liver hypodensities too small to characterize but likely cysts. Aortic Atherosclerosis (ICD10-I70.0). Electronically Signed   By: Marin Olp M.D.   On: 08/20/2018 19:26    Procedures Procedures (including critical care time)  Medications Ordered in ED Medications  iopamidol  (ISOVUE-300) 61 % injection (  Hold 08/20/18 1832)  sodium chloride (PF) 0.9 % injection (0 mLs  Hold 08/20/18 1833)  0.9 %  sodium chloride infusion (Manually program via Guardrails IV Fluids) ( Intravenous Hold 08/20/18 1938)  prochlorperazine (COMPAZINE) tablet 10 mg (has no administration in time range)  loperamide (IMODIUM) capsule 2 mg (has no administration in time range)  loperamide (IMODIUM) capsule 2 mg (has no administration in time range)  ondansetron (ZOFRAN) injection 4 mg (has no administration in time range)  oxyCODONE-acetaminophen (PERCOCET/ROXICET) 5-325 MG per tablet 1 tablet (has no administration in time range)  sodium chloride 0.9 % bolus 1,000 mL (0 mLs Intravenous Stopped 08/20/18 1916)  morphine 4 MG/ML injection 4 mg (4 mg Intravenous Given 08/20/18 1801)  ondansetron (ZOFRAN) injection 4 mg (4 mg Intravenous Given 08/20/18 1801)  iopamidol (ISOVUE-300) 61 % injection 100 mL (100 mLs Intravenous Contrast Given 08/20/18 1859)  sodium chloride 0.9 % bolus 1,000 mL (1,000 mLs Intravenous New Bag/Given 08/20/18 1938)     Initial Impression / Assessment and Plan / ED Course  I have reviewed the triage vital signs and the nursing notes.  Pertinent labs & imaging results that were available during my care of the patient were reviewed by me and considered in my medical decision making (see chart for details).  Clinical Course as of Aug 20 2026  Sun Aug 20, 2018  1910 Hemoglobin drop from baseline, mild worsening of renal function.     [DF]    Clinical Course User Index [DF] Deno Etienne, DO    62 yo F with a chief complaint of cramping lower abdominal pain, going on for the past 3 or 4 days.  Associated with diarrhea.  Denies dark or bloody diarrhea.  My exam with diffuse tenderness to the lower abdomen.  She feels warm to the touch she denies fevers but I feel that she may be febrile based on exam.  Will obtain labs give a bolus of fluids CT the abdomen pelvis reassess.  CT  scan without any acute pathology.  The patient has had a hemoglobin drop from her baseline of about 10 down to 7.2.  She is persistently tachycardic after liter of IV fluids.  Possible she has symptomatic anemia especially with her ongoing chemotherapy.  She has been describing diarrhea though not dark or bloody per her, my rectal exam with no stool.  Will transfuse a unit discussed with the hospitalist for admission.  CRITICAL CARE Performed by: Cecilio Asper   Total critical care time: 35 minutes  Critical care time was exclusive of separately billable procedures and treating other patients.  Critical care was necessary to treat or prevent imminent or life-threatening deterioration.  Critical care was time spent personally by me on the following activities: development of treatment plan with patient and/or surrogate as well as nursing, discussions with consultants, evaluation of patient's response to treatment, examination of patient, obtaining history from patient or surrogate, ordering and performing treatments and interventions, ordering and review of laboratory studies, ordering and review of radiographic studies, pulse oximetry and re-evaluation of patient's condition.  The patients results and plan were reviewed and discussed.   Any x-rays performed were independently reviewed by myself.   Differential diagnosis were considered with the presenting HPI.  Medications  iopamidol (ISOVUE-300) 61 % injection (  Hold 08/20/18 1832)  sodium chloride (PF) 0.9 % injection (0 mLs  Hold 08/20/18 1833)  0.9 %  sodium chloride infusion (Manually program via Guardrails IV Fluids) ( Intravenous Hold 08/20/18 1938)  prochlorperazine (COMPAZINE) tablet 10 mg (has no administration in time range)  loperamide (IMODIUM) capsule 2 mg (has no administration in time range)  loperamide (IMODIUM) capsule 2 mg (has no administration in time range)  ondansetron (ZOFRAN) injection 4 mg (has no  administration in time range)  oxyCODONE-acetaminophen (PERCOCET/ROXICET) 5-325 MG per tablet 1 tablet (has no administration in time range)  sodium chloride 0.9 % bolus 1,000 mL (0 mLs Intravenous Stopped 08/20/18 1916)  morphine 4 MG/ML injection 4 mg (4 mg Intravenous Given 08/20/18 1801)  ondansetron (ZOFRAN) injection 4 mg (  4 mg Intravenous Given 08/20/18 1801)  iopamidol (ISOVUE-300) 61 % injection 100 mL (100 mLs Intravenous Contrast Given 08/20/18 1859)  sodium chloride 0.9 % bolus 1,000 mL (1,000 mLs Intravenous New Bag/Given 08/20/18 1938)    Vitals:   08/20/18 1800 08/20/18 1830 08/20/18 1916 08/20/18 1953  BP: 96/67 104/61 105/67 109/76  Pulse: (!) 108 (!) 104 (!) 105 (!) 102  Resp: 17 17 18 18   Temp:      TempSrc:      SpO2: 93% 95% 97% 95%    Final diagnoses:  Symptomatic anemia    Admission/ observation were discussed with the admitting physician, patient and/or family and they are comfortable with the plan.   Final Clinical Impressions(s) / ED Diagnoses   Final diagnoses:  Symptomatic anemia    ED Discharge Orders    None       Deno Etienne, DO 08/20/18 2027

## 2018-08-20 NOTE — ED Notes (Signed)
Hospitalist at bedside 

## 2018-08-21 ENCOUNTER — Other Ambulatory Visit: Payer: Self-pay

## 2018-08-21 DIAGNOSIS — K521 Toxic gastroenteritis and colitis: Secondary | ICD-10-CM | POA: Diagnosis not present

## 2018-08-21 DIAGNOSIS — D649 Anemia, unspecified: Secondary | ICD-10-CM

## 2018-08-21 DIAGNOSIS — T451X5A Adverse effect of antineoplastic and immunosuppressive drugs, initial encounter: Secondary | ICD-10-CM | POA: Diagnosis not present

## 2018-08-21 LAB — BASIC METABOLIC PANEL WITH GFR
Anion gap: 7 (ref 5–15)
BUN: 14 mg/dL (ref 8–23)
CO2: 24 mmol/L (ref 22–32)
Calcium: 9.2 mg/dL (ref 8.9–10.3)
Chloride: 109 mmol/L (ref 98–111)
Creatinine, Ser: 1.01 mg/dL — ABNORMAL HIGH (ref 0.44–1.00)
GFR calc Af Amer: >60 mL/min
GFR calc non Af Amer: 59 mL/min — ABNORMAL LOW
Glucose, Bld: 87 mg/dL (ref 70–99)
Potassium: 4 mmol/L (ref 3.5–5.1)
Sodium: 140 mmol/L (ref 135–145)

## 2018-08-21 LAB — CBC
HCT: 26.6 % — ABNORMAL LOW (ref 36.0–46.0)
Hemoglobin: 8.2 g/dL — ABNORMAL LOW (ref 12.0–15.0)
MCH: 27.9 pg (ref 26.0–34.0)
MCHC: 30.8 g/dL (ref 30.0–36.0)
MCV: 90.5 fL (ref 80.0–100.0)
Platelets: 182 K/uL (ref 150–400)
RBC: 2.94 MIL/uL — ABNORMAL LOW (ref 3.87–5.11)
RDW: 13.4 % (ref 11.5–15.5)
WBC: 4.9 K/uL (ref 4.0–10.5)
nRBC: 0 % (ref 0.0–0.2)

## 2018-08-21 LAB — URINALYSIS, ROUTINE W REFLEX MICROSCOPIC
Bilirubin Urine: NEGATIVE
Glucose, UA: NEGATIVE mg/dL
KETONES UR: NEGATIVE mg/dL
Leukocytes, UA: NEGATIVE
Nitrite: NEGATIVE
Protein, ur: NEGATIVE mg/dL
Specific Gravity, Urine: 1.02 (ref 1.005–1.030)
pH: 5 (ref 5.0–8.0)

## 2018-08-21 LAB — BPAM RBC
Blood Product Expiration Date: 201912252359
ISSUE DATE / TIME: 201912012326
Unit Type and Rh: 5100

## 2018-08-21 LAB — MAGNESIUM: Magnesium: 1.7 mg/dL (ref 1.7–2.4)

## 2018-08-21 LAB — TYPE AND SCREEN
ABO/RH(D): O POS
Antibody Screen: NEGATIVE
Unit division: 0

## 2018-08-21 MED ORDER — HEPARIN SOD (PORK) LOCK FLUSH 100 UNIT/ML IV SOLN
500.0000 [IU] | Freq: Once | INTRAVENOUS | Status: AC
  Administered 2018-08-21: 500 [IU] via INTRAVENOUS
  Filled 2018-08-21: qty 5

## 2018-08-21 MED ORDER — LIP MEDEX EX OINT
TOPICAL_OINTMENT | CUTANEOUS | Status: DC | PRN
  Administered 2018-08-21: 02:00:00 via TOPICAL
  Filled 2018-08-21: qty 7

## 2018-08-21 NOTE — Discharge Summary (Signed)
Physician Discharge Summary  Jennifer Johns TOI:[id number] DOB: May 15, 1956 DOA: 08/20/2018  PCP: [facility] date: 08/20/2018 Discharge date: 08/21/2018  Time spent: 30* minutes  Recommendations for Outpatient Follow-up:  1. Consider repeat CT chest in 4 weeks for abnormal finding on left lower lobe   Discharge Diagnoses:  Principal Problem:   Chemotherapy induced diarrhea Active Problems:   AKI (acute kidney injury) ([place])   Diarrhea   Discharge Condition: Stable  Diet recommendation: Regular diet   History of present illness:  62 y.o. female with hx of HTN, mild COPD, and recently diagnosed L breast CA stage IIA on chemo who recently had cycle 3 of adjuvant chemotherapy with doxorubicin + cytoxan on 08/11/18 infusion and presents with recurrent watery (non-bloody) diarrhea x 5-6 days. Patient has noted similar watery diarrhea with previous chemo cycles and has been prescribed imodium for symptomatic diarrhea. Since the 08/11/18 infusion, she experienced lower abdominal discomfort post prandially and watery diarrhea -- initially improved with home imodium. However with Thanksgiving, patient had heavier meals than usual and since then has had recurrent diarrhea with associated fatigue. Last bowel movement was this afternoon around 2:30 pm described as pieces of undigested "salad" and watery. No blood, tarry or melanotic stools.  Denies nausea or vomiting. No fevers/chills. No sick contacts and no family members with GI illness.    Hospital Course:   Diarrhea-patient presents with diarrhea 1 week post chemo cycle Cytoxan plus Adriamycin, likely chemo induced diarrhea.  Patient received loperamide last night and diarrhea has resolved at this time.  Abnormal CT results-CT abdomen shows abnormality in left lower lobe which could be inflammatory versus infectious, patient does not appear to be septic at this time I doubt it is not pneumonia.  Patient is not  septic, lactic acid was 0.61, WBC 4.9.  She is afebrile.  Would not start antibiotics.  She will need a repeat CT scan chest in 4 weeks to check for resolution of this abnormality on CT.  Breast carcinoma stage IIa-patient is currently on chemotherapy.  Hypertension-we will hold HCTZ as patient is at risk for developing acute kidney injury in setting of diarrhea post chemo.  Patient blood pressure has been stable.  Will continue with lisinopril.   Anemia-hemoglobin stable at 8.2 this morning.   Procedures:    Consultations:    Discharge Exam: Vitals:   08/21/18 0442 08/21/18 1209  BP: 104/76 118/72  Pulse: 98 99  Resp: 19 16  Temp: 99 F (37.2 C) 98.6 F (37 C)  SpO2: 95% 100%    General: *Appears in no acute distress Cardiovascular: S1-S2, regular Respiratory: Clear to auscultation bilaterally  Discharge Instructions   Discharge Instructions    Diet - low sodium heart healthy   Complete by:  As directed    Increase activity slowly   Complete by:  As directed      Allergies as of 08/21/2018      Reactions   Carrot [daucus Carota] Hives, Shortness Of Breath   Other Hives   "chitlins" or pig intestine      Medication List    STOP taking these medications   hydrochlorothiazide 12.5 MG capsule Commonly known as:  MICROZIDE     TAKE these medications   albuterol 108 (90 Base) MCG/ACT inhaler Commonly known as:  PROVENTIL HFA;VENTOLIN HFA Inhale 2 puffs into the lungs every 6 (six) hours as needed for wheezing or shortness of breath.   albuterol 1.25 MG/3ML nebulizer solution  Commonly known as:  ACCUNEB Take 1 ampule by nebulization every 6 (six) hours as needed for wheezing or shortness of breath.   cholecalciferol 25 MCG (1000 UT) tablet Commonly known as:  VITAMIN D Take 1,000 Units by mouth daily.   lidocaine-prilocaine cream Commonly known as:  EMLA Apply to affected area once   lisinopril 10 MG tablet Commonly known as:   PRINIVIL,ZESTRIL Take 10 mg by mouth daily.   loperamide 2 MG capsule Commonly known as:  IMODIUM Take 2 tabs with first watery stool then 1 tab after each subsequent watery stool. Do not exceed 8 tabs per day   ondansetron 8 MG tablet Commonly known as:  ZOFRAN Take 1 tablet (8 mg total) by mouth 2 (two) times daily as needed. Start on the third day after chemotherapy.   oxyCODONE 5 MG immediate release tablet Commonly known as:  Oxy IR/ROXICODONE Take 1 tablet (5 mg total) by mouth every 6 (six) hours as needed for moderate pain or severe pain.   prochlorperazine 10 MG tablet Commonly known as:  COMPAZINE Take 1 tablet (10 mg total) by mouth every 6 (six) hours as needed (Nausea or vomiting).   ranitidine 150 MG tablet Commonly known as:  ZANTAC Take 150 mg by mouth daily.      Allergies  Allergen Reactions  . Carrot [Daucus Carota] Hives and Shortness Of Breath  . Other Hives    "chitlins" or pig intestine      The results of significant diagnostics from this hospitalization (including imaging, microbiology, ancillary and laboratory) are listed below for reference.    Significant Diagnostic Studies: Ct Abdomen Pelvis W Contrast  Result Date: 08/20/2018 CLINICAL DATA:  Weakness with generalized abdominal pain, diarrhea and chills 4 days. Last chemotherapy treatment November 21st for breast cancer. EXAM: CT ABDOMEN AND PELVIS WITH CONTRAST TECHNIQUE: Multidetector CT imaging of the abdomen and pelvis was performed using the standard protocol following bolus administration of intravenous contrast. CONTRAST:  168mL ISOVUE-300 IOPAMIDOL (ISOVUE-300) INJECTION 61% COMPARISON:  None. FINDINGS: Lower chest: Subtle focal nodular opacification of the posterior left lower lobe. Calcified granuloma over the left lower lobe. Hepatobiliary: There are a couple subcentimeter liver hypodensities too small to characterize but likely cysts. Gallbladder and biliary tree are normal. Pancreas:  Mild dilatation of the main pancreatic duct over the pancreatic head measuring up to 6 mm. No focal mass or ductal stones identified. Spleen: Normal. Adrenals/Urinary Tract: Adrenal glands are normal. Kidneys normal in size without hydronephrosis or nephrolithiasis. Ureters and bladder are normal. Stomach/Bowel: Stomach and small bowel are within normal. Appendix is normal. Colon is normal. Vascular/Lymphatic: Calcified plaque over the abdominal aorta. No adenopathy. Reproductive: Normal. Other: No significant free fluid or focal inflammatory change. Musculoskeletal: Mild degenerative change of the spine with disc disease at the L4-5 and L5-S1 levels. IMPRESSION: No acute findings in the abdomen/pelvis. Mild prominence of the main pancreatic duct measuring 6 mm. No evidence of mass, adenopathy or ductal stones. Subtle focal nodularity over the posterior left lower lobe which may be acute or chronic and may be due to an infectious versus atypical infectious or inflammatory process. Recommend follow-up chest CT 4-6 weeks. Two subcentimeter liver hypodensities too small to characterize but likely cysts. Aortic Atherosclerosis (ICD10-I70.0). Electronically Signed   By: Marin Olp M.D.   On: 08/20/2018 19:26    Microbiology: No results found for this or any previous visit (from the past 240 hour(s)).   Labs: Basic Metabolic Panel: Recent Labs  Lab 08/20/18  1642 08/20/18 1809 08/21/18 0500  NA 136 137 140  K 4.2 4.3 4.0  CL 103 105 109  CO2 25  --  24  GLUCOSE 99 92 87  BUN 24* 23 14  CREATININE 1.32* 1.20* 1.01*  CALCIUM 9.4  --  9.2  MG  --   --  1.7   Liver Function Tests: Recent Labs  Lab 08/20/18 1642  AST 14*  ALT 16  ALKPHOS 68  BILITOT 0.2*  PROT 6.4*  ALBUMIN 3.2*   Recent Labs  Lab 08/20/18 1642  LIPASE 30   No results for input(s): AMMONIA in the last 168 hours. CBC: Recent Labs  Lab 08/20/18 1642 08/20/18 1809 08/21/18 0500  WBC 4.4  --  4.9  HGB 7.2* 7.5* 8.2*   HCT 23.9* 22.0* 26.6*  MCV 92.6  --  90.5  PLT 181  --  182       Signed:  Oswald Hillock MD.  [facility] 08/21/2018, 12:31 PM

## 2018-08-24 ENCOUNTER — Inpatient Hospital Stay: Payer: Medicaid Other | Attending: Nurse Practitioner

## 2018-08-24 ENCOUNTER — Inpatient Hospital Stay (HOSPITAL_BASED_OUTPATIENT_CLINIC_OR_DEPARTMENT_OTHER): Payer: Medicaid Other | Admitting: Hematology

## 2018-08-24 ENCOUNTER — Inpatient Hospital Stay: Payer: Medicaid Other

## 2018-08-24 VITALS — BP 143/78 | HR 115 | Temp 99.6°F | Resp 17 | Ht 66.0 in | Wt 136.3 lb

## 2018-08-24 VITALS — HR 99

## 2018-08-24 DIAGNOSIS — J449 Chronic obstructive pulmonary disease, unspecified: Secondary | ICD-10-CM | POA: Diagnosis not present

## 2018-08-24 DIAGNOSIS — R197 Diarrhea, unspecified: Secondary | ICD-10-CM | POA: Diagnosis not present

## 2018-08-24 DIAGNOSIS — R634 Abnormal weight loss: Secondary | ICD-10-CM | POA: Insufficient documentation

## 2018-08-24 DIAGNOSIS — Z171 Estrogen receptor negative status [ER-]: Secondary | ICD-10-CM

## 2018-08-24 DIAGNOSIS — I10 Essential (primary) hypertension: Secondary | ICD-10-CM | POA: Insufficient documentation

## 2018-08-24 DIAGNOSIS — D493 Neoplasm of unspecified behavior of breast: Secondary | ICD-10-CM

## 2018-08-24 DIAGNOSIS — C50112 Malignant neoplasm of central portion of left female breast: Secondary | ICD-10-CM | POA: Diagnosis present

## 2018-08-24 DIAGNOSIS — D6481 Anemia due to antineoplastic chemotherapy: Secondary | ICD-10-CM | POA: Insufficient documentation

## 2018-08-24 DIAGNOSIS — Z95828 Presence of other vascular implants and grafts: Secondary | ICD-10-CM

## 2018-08-24 DIAGNOSIS — Z5111 Encounter for antineoplastic chemotherapy: Secondary | ICD-10-CM | POA: Insufficient documentation

## 2018-08-24 LAB — CBC WITH DIFFERENTIAL (CANCER CENTER ONLY)
Abs Immature Granulocytes: 0.15 10*3/uL — ABNORMAL HIGH (ref 0.00–0.07)
Basophils Absolute: 0.1 10*3/uL (ref 0.0–0.1)
Basophils Relative: 1 %
Eosinophils Absolute: 0 10*3/uL (ref 0.0–0.5)
Eosinophils Relative: 0 %
HCT: 28.4 % — ABNORMAL LOW (ref 36.0–46.0)
Hemoglobin: 9 g/dL — ABNORMAL LOW (ref 12.0–15.0)
IMMATURE GRANULOCYTES: 2 %
Lymphocytes Relative: 6 %
Lymphs Abs: 0.6 10*3/uL — ABNORMAL LOW (ref 0.7–4.0)
MCH: 28.2 pg (ref 26.0–34.0)
MCHC: 31.7 g/dL (ref 30.0–36.0)
MCV: 89 fL (ref 80.0–100.0)
Monocytes Absolute: 1.4 10*3/uL — ABNORMAL HIGH (ref 0.1–1.0)
Monocytes Relative: 15 %
Neutro Abs: 6.9 10*3/uL (ref 1.7–7.7)
Neutrophils Relative %: 76 %
Platelet Count: 308 10*3/uL (ref 150–400)
RBC: 3.19 MIL/uL — ABNORMAL LOW (ref 3.87–5.11)
RDW: 13.6 % (ref 11.5–15.5)
WBC Count: 9 10*3/uL (ref 4.0–10.5)
nRBC: 0 % (ref 0.0–0.2)

## 2018-08-24 LAB — CMP (CANCER CENTER ONLY)
ALT: 12 U/L (ref 0–44)
AST: 14 U/L — ABNORMAL LOW (ref 15–41)
Albumin: 3.1 g/dL — ABNORMAL LOW (ref 3.5–5.0)
Alkaline Phosphatase: 79 U/L (ref 38–126)
Anion gap: 8 (ref 5–15)
BUN: 7 mg/dL — ABNORMAL LOW (ref 8–23)
CO2: 27 mmol/L (ref 22–32)
Calcium: 9.9 mg/dL (ref 8.9–10.3)
Chloride: 104 mmol/L (ref 98–111)
Creatinine: 0.88 mg/dL (ref 0.44–1.00)
GFR, Est AFR Am: >60 mL/min (ref >60)
GFR, Estimated: >60 mL/min (ref >60)
Glucose, Bld: 93 mg/dL (ref 70–99)
Potassium: 3.7 mmol/L (ref 3.5–5.1)
Sodium: 139 mmol/L (ref 135–145)
TOTAL PROTEIN: 6.6 g/dL (ref 6.5–8.1)
Total Bilirubin: <0.2 mg/dL — ABNORMAL LOW (ref 0.3–1.2)

## 2018-08-24 MED ORDER — SODIUM CHLORIDE 0.9 % IV SOLN
Freq: Once | INTRAVENOUS | Status: AC
  Administered 2018-08-24: 14:00:00 via INTRAVENOUS
  Filled 2018-08-24: qty 250

## 2018-08-24 MED ORDER — PEGFILGRASTIM 6 MG/0.6ML ~~LOC~~ PSKT
PREFILLED_SYRINGE | SUBCUTANEOUS | Status: AC
  Filled 2018-08-24: qty 0.6

## 2018-08-24 MED ORDER — HEPARIN SOD (PORK) LOCK FLUSH 100 UNIT/ML IV SOLN
500.0000 [IU] | Freq: Once | INTRAVENOUS | Status: AC | PRN
  Administered 2018-08-24: 500 [IU]
  Filled 2018-08-24: qty 5

## 2018-08-24 MED ORDER — PALONOSETRON HCL INJECTION 0.25 MG/5ML
0.2500 mg | Freq: Once | INTRAVENOUS | Status: AC
  Administered 2018-08-24: 0.25 mg via INTRAVENOUS

## 2018-08-24 MED ORDER — SODIUM CHLORIDE 0.9 % IV SOLN
Freq: Once | INTRAVENOUS | Status: AC
  Administered 2018-08-24: 15:00:00 via INTRAVENOUS
  Filled 2018-08-24: qty 5

## 2018-08-24 MED ORDER — PEGFILGRASTIM 6 MG/0.6ML ~~LOC~~ PSKT
6.0000 mg | PREFILLED_SYRINGE | Freq: Once | SUBCUTANEOUS | Status: AC
  Administered 2018-08-24: 6 mg via SUBCUTANEOUS

## 2018-08-24 MED ORDER — PALONOSETRON HCL INJECTION 0.25 MG/5ML
INTRAVENOUS | Status: AC
  Filled 2018-08-24: qty 5

## 2018-08-24 MED ORDER — DOXORUBICIN HCL CHEMO IV INJECTION 2 MG/ML
50.0000 mg/m2 | Freq: Once | INTRAVENOUS | Status: AC
  Administered 2018-08-24: 86 mg via INTRAVENOUS
  Filled 2018-08-24: qty 43

## 2018-08-24 MED ORDER — SODIUM CHLORIDE 0.9% FLUSH
10.0000 mL | INTRAVENOUS | Status: DC | PRN
  Administered 2018-08-24: 10 mL
  Filled 2018-08-24: qty 10

## 2018-08-24 MED ORDER — SODIUM CHLORIDE 0.9 % IV SOLN
500.0000 mg/m2 | Freq: Once | INTRAVENOUS | Status: AC
  Administered 2018-08-24: 860 mg via INTRAVENOUS
  Filled 2018-08-24: qty 43

## 2018-08-24 NOTE — Progress Notes (Signed)
[place]   Telephone:([phone] Fax:([phone]) [phone]   Clinic Follow up Note   Patient Care Team: [facility] as PCP - General Coralie Keens, MD as Consulting Physician (General Surgery) Truitt Merle, MD as Consulting Physician (Hematology)  Date of Service:  08/24/2018  CHIEF COMPLAINT: F/u of left breast cancer  SUMMARY OF ONCOLOGIC HISTORY:   Cancer of central portion of left female breast ([place])   04/28/2018 Cancer Staging    Staging form: Breast, AJCC 8th Edition - Clinical stage from 04/28/2018: Stage IB (cT1c, cN0, cM0, G3, ER-, PR-, HER2-) - Signed by Truitt Merle, MD on 05/24/2018    05/24/2018 Initial Diagnosis    Cancer of central portion of left female breast ([place])    06/02/2018 Cancer Staging    Staging form: Breast, AJCC 8th Edition - Pathologic stage from 06/02/2018: Stage IIA (pT2, pN0, cM0, G3, ER-, PR-, HER2-) - Signed by Truitt Merle, MD on 06/23/2018    06/07/2018 Surgery    LEFT BREAST PARTIAL MASTECTOMY WITH AXILLARY SENTINEL LYMPH NODE BIOPSY and PAC placement by Dr. Ninfa Linden  06/07/18    06/07/2018 Pathology Results    Diagnosis 06/07/18 1. Breast, partial mastectomy, Left - INVASIVE DUCTAL CARCINOMA, GRADE III, 2.1 CM 1 of 4 FINAL for Jennifer Johns, Jennifer Johns [phone]) Diagnosis(continued) - SURGICAL RESECTION MARGINS ARE NEGATIVE FOR CARCINOMA. - NEGATIVE FOR LYMPHOVASCULAR OR PERINEURAL INVASION. - BIOPSY SITE CHANGES. - SEE ONCOLOGY TABLE. - SEE NOTE. 2. Lymph node, sentinel, biopsy, Left Axillary - LYMPH NODE, NEGATIVE FOR CARCINOMA (0/1). 3. Lymph node, sentinel, biopsy, Left - LYMPH NODE, NEGATIVE FOR CARCINOMA (0/1). 4. Lymph node, sentinel, biopsy, Left - LYMPH NODE, NEGATIVE FOR CARCINOMA (0/1). 5. Lymph node, sentinel, biopsy, Left - LYMPH NODE, NEGATIVE FOR CARCINOMA (0/1). 6. Lymph node, sentinel, biopsy, Left - LYMPH NODE, NEGATIVE FOR CARCINOMA (0/1). 7. Lymph node, sentinel, biopsy, Left - LYMPH NODE,  NEGATIVE FOR CARCINOMA (0/1). The tumor cells are Negative for Her2 (0). Estrogen Receptor: 0%, NEGATIVE Progesterone Receptor: 0%, NEGATIVE Proliferation Marker Ki67: 40%    06/08/2018 Breast MRI    MRI Breast B/l 05/29/18 IMPRESSION: Solitary mass within the UPPER central portion of the LEFT breast, 2.3 Centimeters in maximum diameter. No MRI evidence for adenopathy. RIGHT breast is negative. LEFT BREAST PARTIAL MASTECTOMY WITH AXILLARY SENTINEL LYMPH NODE BIOPSY with PAC placement by Dr. Ninfa Linden 06/07/18    07/11/2018 Imaging    Whole Body Bone Scan 07/11/18  IMPRESSION: No scintigraphic evidence of osseous metastatic disease.    07/13/2018 -  Chemotherapy    Adjuvant AC every 2 weeks for 4 cycles, 07/13/18-08/24/18. Followed by Taxol every 2 weeks    08/20/2018 Imaging    CT [facility] W Contrast 08/20/18  IMPRESSION: No acute findings in the abdomen/pelvis. Mild prominence of the main pancreatic duct measuring 6 mm. No evidence of mass, adenopathy or ductal stones. Subtle focal nodularity over the posterior left lower lobe which may be acute or chronic and may be due to an infectious versus atypical infectious or inflammatory process. Recommend follow-up chest CT 4-6 weeks. Two subcentimeter liver hypodensities too small to characterize but likely cysts. Aortic Atherosclerosis (ICD10-I70.0).      CURRENT THERAPY:  Adjuvant AC every 2 weeks for 4 cycles, 07/13/18-08/24/18. Followed by Taxol every 1-2 weeks.   INTERVAL HISTORY:  Jennifer Johns is here for a follow up and Last cycle AC. Since her last visit she was hospitalized for severe diarrhea and was discharged on 08/21/18.  She presents to the clinic today with her family member. She note since her hospitalization her diarrhea has resolved. Her weakness brought her to the hospital. She got worse her second week of treatment. She attributes this to her anemia and diarrhea. She feels she is 70% back to baseline. She notes she  tries to eat, but has been able to maintain her week. She feels she is fine to proceed with treatment today. She has imodium at home.     REVIEW OF SYSTEMS:   Constitutional: Denies fevers, chills or abnormal weight loss (+) fatigue Eyes: Denies blurriness of vision Ears, nose, mouth, throat, and face: Denies mucositis or sore throat Respiratory: Denies cough, dyspnea or wheezes Cardiovascular: Denies palpitation, chest discomfort or lower extremity swelling Gastrointestinal:  Denies nausea, heartburn or change in bowel habits Skin: Denies abnormal skin rashes Lymphatics: Denies new lymphadenopathy or easy bruising Neurological:Denies numbness, tingling or new weaknesses Behavioral/Psych: Mood is stable, no new changes  All other systems were reviewed with the patient and are negative.  MEDICAL HISTORY:  Past Medical History:  Diagnosis Date  . Asthma   . Cancer ([place])    Left Breast  . COPD (chronic obstructive pulmonary disease) ([place])   . Hypertension     SURGICAL HISTORY: Past Surgical History:  Procedure Laterality Date  . COLONOSCOPY N/A 03/18/2016   Procedure: COLONOSCOPY;  Surgeon: Danie Binder, MD;  [facility];  Service: Endoscopy;  Laterality: N/A;  10:30 AM  . PARTIAL MASTECTOMY WITH AXILLARY SENTINEL LYMPH NODE BIOPSY Left 06/07/2018   Procedure: LEFT BREAST PARTIAL MASTECTOMY WITH AXILLARY SENTINEL LYMPH NODE BIOPSY;  Surgeon: Coralie Keens, MD;  Location: [facility];  Service: General;  Laterality: Left;  . PORTACATH PLACEMENT N/A 06/07/2018   Procedure: INSERTION PORT-A-CATH;  Surgeon: Coralie Keens, MD;  Location: [facility];  Service: General;  Laterality: N/A;    I have reviewed the social history and family history with the patient and they are unchanged from previous note.  ALLERGIES:  is allergic to carrot [daucus carota] and other.  MEDICATIONS:  Current Outpatient Medications  Medication Sig Dispense Refill  . albuterol (ACCUNEB) 1.25 MG/3ML  nebulizer solution Take 1 ampule by nebulization every 6 (six) hours as needed for wheezing or shortness of breath.     Marland Kitchen albuterol (PROVENTIL HFA;VENTOLIN HFA) 108 (90 Base) MCG/ACT inhaler Inhale 2 puffs into the lungs every 6 (six) hours as needed for wheezing or shortness of breath.     . cholecalciferol (VITAMIN D) 1000 units tablet Take 1,000 Units by mouth daily.   1  . lidocaine-prilocaine (EMLA) cream Apply to affected area once 30 g 3  . lisinopril (PRINIVIL,ZESTRIL) 10 MG tablet Take 10 mg by mouth daily.    Marland Kitchen loperamide (IMODIUM) 2 MG capsule Take 2 tabs with first watery stool then 1 tab after each subsequent watery stool. Do not exceed 8 tabs per day 40 capsule 1  . ondansetron (ZOFRAN) 8 MG tablet Take 1 tablet (8 mg total) by mouth 2 (two) times daily as needed. Start on the third day after chemotherapy. 30 tablet 1  . oxyCODONE (OXY IR/ROXICODONE) 5 MG immediate release tablet Take 1 tablet (5 mg total) by mouth every 6 (six) hours as needed for moderate pain or severe pain. 30 tablet 0  . prochlorperazine (COMPAZINE) 10 MG tablet Take 1 tablet (10 mg total) by mouth every 6 (six) hours as needed (Nausea or vomiting). 30 tablet 1  . ranitidine (ZANTAC) 150 MG tablet  Take 150 mg by mouth daily.   1   No current facility-administered medications for this visit.     PHYSICAL EXAMINATION: ECOG PERFORMANCE STATUS: 1 - Symptomatic but completely ambulatory  Vitals:   08/24/18 1328  BP: (!) 143/78  Pulse: (!) 115  Resp: 17  Temp: 99.6 F (37.6 C)  SpO2: 96%   Filed Weights   08/24/18 1328  Weight: 136 lb 4.8 oz (61.8 kg)    GENERAL:alert, no distress and comfortable SKIN: skin color, texture, turgor are normal, no rashes or significant lesions (+) darkening of skin EYES: normal, Conjunctiva are pink and non-injected, sclera clear OROPHARYNX:no exudate, no erythema and lips, buccal mucosa, and tongue normal  (+) dark spot of tongue  NECK: supple, thyroid normal size,  non-tender, without nodularity LYMPH:  no palpable lymphadenopathy in the cervical, axillary or inguinal LUNGS: clear to auscultation and percussion with normal breathing effort HEART: regular rate & rhythm and no murmurs and no lower extremity edema ABDOMEN:abdomen soft, non-tender and normal bowel sounds Musculoskeletal:no cyanosis of digits and no clubbing  NEURO: alert & oriented x 3 with fluent speech, no focal motor/sensory deficits  LABORATORY DATA:  I have reviewed the data as listed CBC Latest Ref Rng & Units 08/24/2018 08/21/2018 08/20/2018  WBC 4.0 - 10.5 K/uL 9.0 4.9 -  Hemoglobin 12.0 - 15.0 g/dL 9.0(L) 8.2(L) 7.5(L)  Hematocrit 36.0 - 46.0 % 28.4(L) 26.6(L) 22.0(L)  Platelets 150 - 400 K/uL 308 182 -     CMP Latest Ref Rng & Units 08/24/2018 08/21/2018 08/20/2018  Glucose 70 - 99 mg/dL 93 87 92  BUN 8 - 23 mg/dL 7(L) 14 23  Creatinine 0.44 - 1.00 mg/dL 0.88 1.01(H) 1.20(H)  Sodium 135 - 145 mmol/L 139 140 137  Potassium 3.5 - 5.1 mmol/L 3.7 4.0 4.3  Chloride 98 - 111 mmol/L 104 109 105  CO2 22 - 32 mmol/L 27 24 -  Calcium 8.9 - 10.3 mg/dL 9.9 9.2 -  Total Protein 6.5 - 8.1 g/dL 6.6 - -  Total Bilirubin 0.3 - 1.2 mg/dL <0.2(L) - -  Alkaline Phos 38 - 126 U/L 79 - -  AST 15 - 41 U/L 14(L) - -  ALT 0 - 44 U/L 12 - -      RADIOGRAPHIC STUDIES: I have personally reviewed the radiological images as listed and agreed with the findings in the report. No results found.   ASSESSMENT & PLAN:  TAHARI CLABAUGH is a 62 y.o. female with    1. 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 She is s/p left partial mastectomy. She is currently being treated with adjuvant AC every 2 weeks.  -She has moderately tolerated chemo with significant fatigue and diarrhea. She was hospitalized for her weakness and diarrhea this past weekend after cycle 3.  She also had a significant anemia which  required blood transfusion -He has recovered well, labs reviewed, Hg and Cr improved. Overall adequate to proceed with [facility] today.  We will reduce her chemo dose by 15% for cycle  AC today --She may not be able to tolerate CT combination, due to her early stage disease, I will recommend weekly Taxol single agent. She agreed. F/u in 2 weeks to start Taxol   2. Diarrhea, Secondary to treatment  -Very significant during resent hospitalization. Currently resolved since discharge.  -I advised her to use Imodium at least twice daily  if she starts developing loose stool.    3. Anemia, secondary to chemo  -She was recently in the hospital with a symptomatic hg of 7-8. She did received blood transfusion on 08/22/18.  -Hg is 9 today (08/24/18) no need for blood transfusion today   4. COPD, HTN -She is no longer smoking.  -She will continue her medication, and follow-up with primary care physician. -stable    Plan -Labs reviewed and adequate to proceed with cycle 4 AC today with dose reduction  -Start Taxol in 2 weeks  -F/u with in 2 weeks     No problem-specific Assessment & Plan notes found for this encounter.   No orders of the defined types were placed in this encounter.  All questions were answered. The patient knows to call the clinic with any problems, questions or concerns. No barriers to learning was detected. I spent 20 minutes counseling the patient face to face. The total time spent in the appointment was 25 minutes and more than 50% was on counseling and review of test results     Truitt Merle, MD 08/24/2018   I, Joslyn Devon, am acting as scribe for Truitt Merle, MD.   I have reviewed the above documentation for accuracy and completeness, and I agree with the above.

## 2018-08-24 NOTE — Patient Instructions (Signed)
[facility] Discharge Instructions for Patients Receiving Chemotherapy  Today you received the following chemotherapy agents: Doxorubicin (Adriamycin) and Cyclophosphamide (Cytoxan).  To help prevent nausea and vomiting after your treatment, we encourage you to take your nausea medication as directed. Received Aloxi during treatment today-->Take Compazine (not Zofran) for the next 3 days as needed.    If you develop nausea and vomiting that is not controlled by your nausea medication, call the clinic.   BELOW ARE SYMPTOMS THAT SHOULD BE REPORTED IMMEDIATELY:  *FEVER GREATER THAN 100.5 F  *CHILLS WITH OR WITHOUT FEVER  NAUSEA AND VOMITING THAT IS NOT CONTROLLED WITH YOUR NAUSEA MEDICATION  *UNUSUAL SHORTNESS OF BREATH  *UNUSUAL BRUISING OR BLEEDING  TENDERNESS IN MOUTH AND THROAT WITH OR WITHOUT PRESENCE OF ULCERS  *URINARY PROBLEMS  *BOWEL PROBLEMS  UNUSUAL RASH Items with * indicate a potential emergency and should be followed up as soon as possible.  Feel free to call the clinic should you have any questions or concerns. The clinic phone number is [phone].  Please show the [facility] at check-in to the Emergency Department and triage nurse.  Pegfilgrastim injection What is this medicine? PEGFILGRASTIM (PEG fil gra stim) is a long-acting granulocyte colony-stimulating factor that stimulates the growth of neutrophils, a type of white blood cell important in the body's fight against infection. It is used to reduce the incidence of fever and infection in patients with certain types of cancer who are receiving chemotherapy that affects the bone marrow, and to increase survival after being exposed to high doses of radiation. This medicine may be used for other purposes; ask your health care provider or pharmacist if you have questions. COMMON BRAND NAME(S): Neulasta What should I tell my health care provider before I take this medicine? They need to know  if you have any of these conditions: -kidney disease -latex allergy -ongoing radiation therapy -sickle cell disease -skin reactions to acrylic adhesives (On-Body Injector only) -an unusual or allergic reaction to pegfilgrastim, filgrastim, other medicines, foods, dyes, or preservatives -pregnant or trying to get pregnant -breast-feeding How should I use this medicine? This medicine is for injection under the skin. If you get this medicine at home, you will be taught how to prepare and give the pre-filled syringe or how to use the On-body Injector. Refer to the patient Instructions for Use for detailed instructions. Use exactly as directed. Tell your healthcare provider immediately if you suspect that the On-body Injector may not have performed as intended or if you suspect the use of the On-body Injector resulted in a missed or partial dose. It is important that you put your used needles and syringes in a special sharps container. Do not put them in a trash can. If you do not have a sharps container, call your pharmacist or healthcare provider to get one. Talk to your pediatrician regarding the use of this medicine in children. While this drug may be prescribed for selected conditions, precautions do apply. Overdosage: If you think you have taken too much of this medicine contact a poison control center or emergency room at once. NOTE: This medicine is only for you. Do not share this medicine with others. What if I miss a dose? It is important not to miss your dose. Call your doctor or health care professional if you miss your dose. If you miss a dose due to an On-body Injector failure or leakage, a new dose should be administered as soon as possible using a  single prefilled syringe for manual use. What may interact with this medicine? Interactions have not been studied. Give your health care provider a list of all the medicines, herbs, non-prescription drugs, or dietary supplements you use. Also  tell them if you smoke, drink alcohol, or use illegal drugs. Some items may interact with your medicine. This list may not describe all possible interactions. Give your health care provider a list of all the medicines, herbs, non-prescription drugs, or dietary supplements you use. Also tell them if you smoke, drink alcohol, or use illegal drugs. Some items may interact with your medicine. What should I watch for while using this medicine? You may need blood work done while you are taking this medicine. If you are going to need a MRI, CT scan, or other procedure, tell your doctor that you are using this medicine (On-Body Injector only). What side effects may I notice from receiving this medicine? Side effects that you should report to your doctor or health care professional as soon as possible: -allergic reactions like skin rash, itching or hives, swelling of the face, lips, or tongue -dizziness -fever -pain, redness, or irritation at site where injected -pinpoint red spots on the skin -red or dark-brown urine -shortness of breath or breathing problems -stomach or side pain, or pain at the shoulder -swelling -tiredness -trouble passing urine or change in the amount of urine Side effects that usually do not require medical attention (report to your doctor or health care professional if they continue or are bothersome): -bone pain -muscle pain This list may not describe all possible side effects. Call your doctor for medical advice about side effects. You may report side effects to FDA at 1-800-FDA-1088. Where should I keep my medicine? Keep out of the reach of children. Store pre-filled syringes in a refrigerator between 2 and 8 degrees C (36 and 46 degrees F). Do not freeze. Keep in carton to protect from light. Throw away this medicine if it is left out of the refrigerator for more than 48 hours. Throw away any unused medicine after the expiration date. NOTE: This sheet is a summary. It may  not cover all possible information. If you have questions about this medicine, talk to your doctor, pharmacist, or health care provider.  2018 Elsevier/Gold Standard (2016-09-02 12:58:03)

## 2018-08-25 ENCOUNTER — Encounter: Payer: Self-pay | Admitting: Hematology

## 2018-08-30 ENCOUNTER — Telehealth: Payer: Self-pay | Admitting: Hematology

## 2018-08-30 NOTE — Telephone Encounter (Signed)
[facility] out - moved 12/19 f/u to [facility] and adjusted associated appointments. Left message for patient. Schedule mailed.

## 2018-09-07 ENCOUNTER — Inpatient Hospital Stay: Payer: Medicaid Other

## 2018-09-07 ENCOUNTER — Other Ambulatory Visit: Payer: Self-pay

## 2018-09-07 ENCOUNTER — Ambulatory Visit: Payer: Medicaid Other | Admitting: Oncology

## 2018-09-07 ENCOUNTER — Other Ambulatory Visit: Payer: Medicaid Other

## 2018-09-07 ENCOUNTER — Encounter: Payer: Self-pay | Admitting: Hematology

## 2018-09-07 ENCOUNTER — Inpatient Hospital Stay (HOSPITAL_BASED_OUTPATIENT_CLINIC_OR_DEPARTMENT_OTHER): Payer: Medicaid Other | Admitting: Hematology

## 2018-09-07 ENCOUNTER — Ambulatory Visit: Payer: Medicaid Other

## 2018-09-07 VITALS — BP 124/67 | HR 121 | Temp 98.9°F | Resp 24 | Ht 66.0 in | Wt 130.1 lb

## 2018-09-07 DIAGNOSIS — Z95828 Presence of other vascular implants and grafts: Secondary | ICD-10-CM

## 2018-09-07 DIAGNOSIS — Z171 Estrogen receptor negative status [ER-]: Secondary | ICD-10-CM

## 2018-09-07 DIAGNOSIS — R197 Diarrhea, unspecified: Secondary | ICD-10-CM

## 2018-09-07 DIAGNOSIS — D6481 Anemia due to antineoplastic chemotherapy: Secondary | ICD-10-CM | POA: Diagnosis not present

## 2018-09-07 DIAGNOSIS — C50112 Malignant neoplasm of central portion of left female breast: Secondary | ICD-10-CM

## 2018-09-07 DIAGNOSIS — I10 Essential (primary) hypertension: Secondary | ICD-10-CM

## 2018-09-07 DIAGNOSIS — R634 Abnormal weight loss: Secondary | ICD-10-CM

## 2018-09-07 DIAGNOSIS — J449 Chronic obstructive pulmonary disease, unspecified: Secondary | ICD-10-CM

## 2018-09-07 DIAGNOSIS — D493 Neoplasm of unspecified behavior of breast: Secondary | ICD-10-CM

## 2018-09-07 DIAGNOSIS — Z5111 Encounter for antineoplastic chemotherapy: Secondary | ICD-10-CM | POA: Diagnosis not present

## 2018-09-07 LAB — CMP (CANCER CENTER ONLY)
ALT: 14 U/L (ref 0–44)
AST: 19 U/L (ref 15–41)
Albumin: 3.1 g/dL — ABNORMAL LOW (ref 3.5–5.0)
Alkaline Phosphatase: 77 U/L (ref 38–126)
Anion gap: 10 (ref 5–15)
BUN: 8 mg/dL (ref 8–23)
CO2: 28 mmol/L (ref 22–32)
Calcium: 9.6 mg/dL (ref 8.9–10.3)
Chloride: 101 mmol/L (ref 98–111)
Creatinine: 0.82 mg/dL (ref 0.44–1.00)
GFR, Est AFR Am: >60 mL/min (ref >60)
GFR, Estimated: >60 mL/min (ref >60)
Glucose, Bld: 93 mg/dL (ref 70–99)
Potassium: 3.9 mmol/L (ref 3.5–5.1)
Sodium: 139 mmol/L (ref 135–145)
Total Bilirubin: 0.3 mg/dL (ref 0.3–1.2)
Total Protein: 6.4 g/dL — ABNORMAL LOW (ref 6.5–8.1)

## 2018-09-07 LAB — CBC WITH DIFFERENTIAL (CANCER CENTER ONLY)
Abs Immature Granulocytes: 0.09 10*3/uL — ABNORMAL HIGH (ref 0.00–0.07)
Basophils Absolute: 0 10*3/uL (ref 0.0–0.1)
Basophils Relative: 0 %
Eosinophils Absolute: 0 10*3/uL (ref 0.0–0.5)
Eosinophils Relative: 0 %
HCT: 24.5 % — ABNORMAL LOW (ref 36.0–46.0)
Hemoglobin: 7.8 g/dL — ABNORMAL LOW (ref 12.0–15.0)
Immature Granulocytes: 1 %
Lymphocytes Relative: 6 %
Lymphs Abs: 0.4 10*3/uL — ABNORMAL LOW (ref 0.7–4.0)
MCH: 28.5 pg (ref 26.0–34.0)
MCHC: 31.8 g/dL (ref 30.0–36.0)
MCV: 89.4 fL (ref 80.0–100.0)
MONOS PCT: 18 %
Monocytes Absolute: 1.3 10*3/uL — ABNORMAL HIGH (ref 0.1–1.0)
Neutro Abs: 5.5 10*3/uL (ref 1.7–7.7)
Neutrophils Relative %: 75 %
Platelet Count: 276 10*3/uL (ref 150–400)
RBC: 2.74 MIL/uL — ABNORMAL LOW (ref 3.87–5.11)
RDW: 14.5 % (ref 11.5–15.5)
WBC Count: 7.3 10*3/uL (ref 4.0–10.5)
nRBC: 0.3 % — ABNORMAL HIGH (ref 0.0–0.2)

## 2018-09-07 LAB — PREPARE RBC (CROSSMATCH)

## 2018-09-07 MED ORDER — DIPHENHYDRAMINE HCL 25 MG PO CAPS: 25.0000 mg | ORAL_CAPSULE | Freq: Once | ORAL | Status: DC

## 2018-09-07 MED ORDER — IPRATROPIUM BROMIDE 0.02 % IN SOLN
0.5000 mg | Freq: Once | RESPIRATORY_TRACT | Status: AC
  Administered 2018-09-07: 0.5 mg via RESPIRATORY_TRACT
  Filled 2018-09-07: qty 2.5

## 2018-09-07 MED ORDER — SODIUM CHLORIDE 0.9% FLUSH
10.0000 mL | INTRAVENOUS | Status: DC | PRN
  Administered 2018-09-07: 10 mL
  Filled 2018-09-07: qty 10

## 2018-09-07 MED ORDER — SODIUM CHLORIDE 0.9% FLUSH
10.0000 mL | INTRAVENOUS | Status: AC | PRN
  Administered 2018-09-07: 10 mL
  Filled 2018-09-07: qty 10

## 2018-09-07 MED ORDER — ACETAMINOPHEN 325 MG PO TABS
325.0000 mg | ORAL_TABLET | Freq: Once | ORAL | Status: AC
  Administered 2018-09-07: 325 mg via ORAL

## 2018-09-07 MED ORDER — ACETAMINOPHEN 325 MG PO TABS
ORAL_TABLET | ORAL | Status: AC
  Filled 2018-09-07: qty 1

## 2018-09-07 MED ORDER — HEPARIN SOD (PORK) LOCK FLUSH 100 UNIT/ML IV SOLN
250.0000 [IU] | INTRAVENOUS | Status: AC | PRN
  Administered 2018-09-07: 500 [IU]
  Filled 2018-09-07: qty 5

## 2018-09-07 MED ORDER — SODIUM CHLORIDE 0.9 % IV SOLN
INTRAVENOUS | Status: AC
  Administered 2018-09-07: 15:00:00 via INTRAVENOUS
  Filled 2018-09-07 (×2): qty 250

## 2018-09-07 MED ORDER — ACETAMINOPHEN 325 MG PO TABS
ORAL_TABLET | ORAL | Status: AC
  Filled 2018-09-07: qty 2

## 2018-09-07 MED ORDER — IPRATROPIUM-ALBUTEROL 0.5-2.5 (3) MG/3ML IN SOLN
3.0000 mL | Freq: Once | RESPIRATORY_TRACT | Status: DC
  Filled 2018-09-07: qty 3

## 2018-09-07 MED ORDER — LEVOFLOXACIN 750 MG PO TABS: 750.0000 mg | ORAL_TABLET | Freq: Every day | ORAL | 0 refills | Status: DC

## 2018-09-07 MED ORDER — ALBUTEROL SULFATE (2.5 MG/3ML) 0.083% IN NEBU
2.5000 mg | INHALATION_SOLUTION | Freq: Once | RESPIRATORY_TRACT | Status: AC
  Administered 2018-09-07: 2.5 mg via RESPIRATORY_TRACT
  Filled 2018-09-07: qty 3

## 2018-09-07 MED ORDER — SODIUM CHLORIDE 0.9 % IV SOLN
INTRAVENOUS | Status: DC
  Filled 2018-09-07: qty 250

## 2018-09-07 MED ORDER — SODIUM CHLORIDE 0.9% IV SOLUTION
250.0000 mL | Freq: Once | INTRAVENOUS | Status: AC
  Administered 2018-09-07: 250 mL via INTRAVENOUS
  Filled 2018-09-07: qty 250

## 2018-09-07 MED ORDER — IPRATROPIUM-ALBUTEROL 0.5-2.5 (3) MG/3ML IN SOLN: 3.0000 mL | Freq: Four times a day (QID) | RESPIRATORY_TRACT | Status: DC

## 2018-09-07 MED ORDER — ACETAMINOPHEN 325 MG PO TABS
650.0000 mg | ORAL_TABLET | Freq: Once | ORAL | Status: AC
  Administered 2018-09-07: 650 mg via ORAL

## 2018-09-07 NOTE — Patient Instructions (Signed)
Blood Transfusion, Adult, Care After This sheet gives you information about how to care for yourself after your procedure. Your doctor may also give you more specific instructions. If you have problems or questions, contact your doctor. Follow these instructions at home:   Take over-the-counter and prescription medicines only as told by your doctor.  Go back to your normal activities as told by your doctor.  Follow instructions from your doctor about how to take care of the area where an IV tube was put into your vein (insertion site). Make sure you: ? Wash your hands with soap and water before you change your bandage (dressing). If there is no soap and water, use hand sanitizer. ? Change your bandage as told by your doctor.  Check your IV insertion site every day for signs of infection. Check for: ? More redness, swelling, or pain. ? More fluid or blood. ? Warmth. ? Pus or a bad smell. Contact a doctor if:  You have more redness, swelling, or pain around the IV insertion site.  You have more fluid or blood coming from the IV insertion site.  Your IV insertion site feels warm to the touch.  You have pus or a bad smell coming from the IV insertion site.  Your pee (urine) turns pink, red, or brown.  You feel weak after doing your normal activities. Get help right away if:  You have signs of a serious allergic or body defense (immune) system reaction, including: ? Itchiness. ? Hives. ? Trouble breathing. ? Anxiety. ? Pain in your chest or lower back. ? Fever, flushing, and chills. ? Fast pulse. ? Rash. ? Watery poop (diarrhea). ? Throwing up (vomiting). ? Dark pee. ? Serious headache. ? Dizziness. ? Stiff neck. ? Yellow color in your face or the white parts of your eyes (jaundice). Summary  After a blood transfusion, return to your normal activities as told by your doctor.  Every day, check for signs of infection where the IV tube was put into your vein.  Some  signs of infection are warm skin, more redness and pain, more fluid or blood, and pus or a bad smell where the needle went in.  Contact your doctor if you feel weak or have any unusual symptoms. This information is not intended to replace advice given to you by your health care provider. Make sure you discuss any questions you have with your health care provider. Document Released: 09/27/2014 Document Revised: 04/30/2016 Document Reviewed: 04/30/2016 Elsevier Interactive Patient Education  2019 Elsevier Inc.  

## 2018-09-07 NOTE — Progress Notes (Signed)
D

## 2018-09-07 NOTE — Progress Notes (Signed)
[place]   [phone] [phone]) [phone]   Clinic Follow up Note   Patient Care Team: [facility] as PCP - General Coralie Keens, MD as Consulting Physician (General Surgery) Truitt Merle, MD as Consulting Physician (Hematology)  Date of Service:  09/07/2018  CHIEF COMPLAINT: f/u of left breast cancer  SUMMARY OF ONCOLOGIC HISTORY:   Cancer of central portion of left female breast ([place])   04/28/2018 Cancer Staging    Staging form: Breast, AJCC 8th Edition - Clinical stage from 04/28/2018: Stage IB (cT1c, cN0, cM0, G3, ER-, PR-, HER2-) - Signed by Truitt Merle, MD on 05/24/2018    05/24/2018 Initial Diagnosis    Cancer of central portion of left female breast ([place])    06/02/2018 Cancer Staging    Staging form: Breast, AJCC 8th Edition - Pathologic stage from 06/02/2018: Stage IIA (pT2, pN0, cM0, G3, ER-, PR-, HER2-) - Signed by Truitt Merle, MD on 06/23/2018    06/07/2018 Surgery    LEFT BREAST PARTIAL MASTECTOMY WITH AXILLARY SENTINEL LYMPH NODE BIOPSY and PAC placement by Dr. Ninfa Linden  06/07/18    06/07/2018 Pathology Results    Diagnosis 06/07/18 1. Breast, partial mastectomy, Left - INVASIVE DUCTAL CARCINOMA, GRADE III, 2.1 CM 1 of 4 FINAL for Jennifer Johns, Jennifer Johns [phone]) Diagnosis(continued) - SURGICAL RESECTION MARGINS ARE NEGATIVE FOR CARCINOMA. - NEGATIVE FOR LYMPHOVASCULAR OR PERINEURAL INVASION. - BIOPSY SITE CHANGES. - SEE ONCOLOGY TABLE. - SEE NOTE. 2. Lymph node, sentinel, biopsy, Left Axillary - LYMPH NODE, NEGATIVE FOR CARCINOMA (0/1). 3. Lymph node, sentinel, biopsy, Left - LYMPH NODE, NEGATIVE FOR CARCINOMA (0/1). 4. Lymph node, sentinel, biopsy, Left - LYMPH NODE, NEGATIVE FOR CARCINOMA (0/1). 5. Lymph node, sentinel, biopsy, Left - LYMPH NODE, NEGATIVE FOR CARCINOMA (0/1). 6. Lymph node, sentinel, biopsy, Left - LYMPH NODE, NEGATIVE FOR CARCINOMA (0/1). 7. Lymph node, sentinel, biopsy, Left - LYMPH NODE,  NEGATIVE FOR CARCINOMA (0/1). The tumor cells are Negative for Her2 (0). Estrogen Receptor: 0%, NEGATIVE Progesterone Receptor: 0%, NEGATIVE Proliferation Marker Ki67: 40%    06/08/2018 Breast MRI    MRI Breast B/l 05/29/18 IMPRESSION: Solitary mass within the UPPER central portion of the LEFT breast, 2.3 Centimeters in maximum diameter. No MRI evidence for adenopathy. RIGHT breast is negative. LEFT BREAST PARTIAL MASTECTOMY WITH AXILLARY SENTINEL LYMPH NODE BIOPSY with PAC placement by Dr. Ninfa Linden 06/07/18    07/11/2018 Imaging    Whole Body Bone Scan 07/11/18  IMPRESSION: No scintigraphic evidence of osseous metastatic disease.    07/13/2018 -  Chemotherapy    Adjuvant AC every 2 weeks for 4 cycles, 07/13/18-08/24/18. Followed by Taxol every 2 weeks    08/20/2018 Imaging    CT [facility] W Contrast 08/20/18  IMPRESSION: No acute findings in the abdomen/pelvis. Mild prominence of the main pancreatic duct measuring 6 mm. No evidence of mass, adenopathy or ductal stones. Subtle focal nodularity over the posterior left lower lobe which may be acute or chronic and may be due to an infectious versus atypical infectious or inflammatory process. Recommend follow-up chest CT 4-6 weeks. Two subcentimeter liver hypodensities too small to characterize but likely cysts. Aortic Atherosclerosis (ICD10-I70.0).      CURRENT THERAPY:  Adjuvant AC every 2 weeks for 4 cycles, 07/13/18-08/24/18. Followed by Taxol weekly starting week of 09/11/18.   INTERVAL HISTORY:  Jennifer Johns is here for a follow up and cycle 1 taxol. She presents to the clinic today accompanied by family member. She notes she feels  very fatigued today. She notes SOB after certain distance. She denies pain but notes having no appetite. She will eat about 2-3 meals of mostly oatmeal and potatoes. She denies nausea, chest discomfort or bloating. She notes she has a cold with no fever. She has been coughing up yellowish phlegm. She  notes her BM are normal.      REVIEW OF SYSTEMS:   Constitutional: Denies fevers, chills or abnormal weight loss (+) fatigue (+) low appetite.  Eyes: Denies blurriness of vision Ears, nose, mouth, throat, and face: Denies mucositis or sore throat Respiratory: Denies dyspnea or wheezes (+) cold symptoms with cough with yellowish phlegm Cardiovascular: Denies palpitation, chest discomfort or lower extremity swelling Gastrointestinal:  Denies nausea, heartburn or change in bowel habits Skin: Denies abnormal skin rashes Lymphatics: Denies new lymphadenopathy or easy bruising Neurological:Denies numbness, tingling or new weaknesses Behavioral/Psych: Mood is stable, no new changes  All other systems were reviewed with the patient and are negative.  MEDICAL HISTORY:  Past Medical History:  Diagnosis Date  . Asthma   . Cancer ([place])    Left Breast  . COPD (chronic obstructive pulmonary disease) ([place])   . Hypertension     SURGICAL HISTORY: Past Surgical History:  Procedure Laterality Date  . COLONOSCOPY N/A 03/18/2016   Procedure: COLONOSCOPY;  Surgeon: Danie Binder, MD;  [facility];  Service: Endoscopy;  Laterality: N/A;  10:30 AM  . PARTIAL MASTECTOMY WITH AXILLARY SENTINEL LYMPH NODE BIOPSY Left 06/07/2018   Procedure: LEFT BREAST PARTIAL MASTECTOMY WITH AXILLARY SENTINEL LYMPH NODE BIOPSY;  Surgeon: Coralie Keens, MD;  Location: [facility];  Service: General;  Laterality: Left;  . PORTACATH PLACEMENT N/A 06/07/2018   Procedure: INSERTION PORT-A-CATH;  Surgeon: Coralie Keens, MD;  Location: [facility];  Service: General;  Laterality: N/A;    I have reviewed the social history and family history with the patient and they are unchanged from previous note.  ALLERGIES:  is allergic to carrot [daucus carota] and other.  MEDICATIONS:  Current Outpatient Medications  Medication Sig Dispense Refill  . albuterol (ACCUNEB) 1.25 MG/3ML nebulizer solution Take 1 ampule by  nebulization every 6 (six) hours as needed for wheezing or shortness of breath.     Marland Kitchen albuterol (PROVENTIL HFA;VENTOLIN HFA) 108 (90 Base) MCG/ACT inhaler Inhale 2 puffs into the lungs every 6 (six) hours as needed for wheezing or shortness of breath.     . cholecalciferol (VITAMIN D) 1000 units tablet Take 1,000 Units by mouth daily.   1  . lidocaine-prilocaine (EMLA) cream Apply to affected area once 30 g 3  . lisinopril (PRINIVIL,ZESTRIL) 10 MG tablet Take 10 mg by mouth daily.    Marland Kitchen loperamide (IMODIUM) 2 MG capsule Take 2 tabs with first watery stool then 1 tab after each subsequent watery stool. Do not exceed 8 tabs per day 40 capsule 1  . ondansetron (ZOFRAN) 8 MG tablet Take 1 tablet (8 mg total) by mouth 2 (two) times daily as needed. Start on the third day after chemotherapy. 30 tablet 1  . oxyCODONE (OXY IR/ROXICODONE) 5 MG immediate release tablet Take 1 tablet (5 mg total) by mouth every 6 (six) hours as needed for moderate pain or severe pain. 30 tablet 0  . prochlorperazine (COMPAZINE) 10 MG tablet Take 1 tablet (10 mg total) by mouth every 6 (six) hours as needed (Nausea or vomiting). 30 tablet 1  . ranitidine (ZANTAC) 150 MG tablet Take 150 mg by mouth daily.   1  No current facility-administered medications for this visit.    Facility-Administered Medications Ordered in Other Visits  Medication Dose Route Frequency Provider Last Rate Last Dose  . 0.9 %  sodium chloride infusion   Intravenous Continuous Truitt Merle, MD      . diphenhydrAMINE (BENADRYL) capsule 25 mg  25 mg Oral Once Truitt Merle, MD      . sodium chloride flush (NS) 0.9 % injection 10 mL  10 mL Intracatheter PRN Truitt Merle, MD        PHYSICAL EXAMINATION: ECOG PERFORMANCE STATUS: 3 - Symptomatic, >50% confined to bed  Vitals:   09/07/18 1400  BP: 124/67  Pulse: (!) 121  Resp: (!) 24  Temp: 98.9 F (37.2 C)  SpO2: 100%   Filed Weights   09/07/18 1400  Weight: 130 lb 1.6 oz (59 kg)    GENERAL:alert, no  distress and comfortable SKIN: skin color, texture, turgor are normal, no rashes or significant lesions EYES: normal, Conjunctiva are pink and non-injected, sclera clear OROPHARYNX:no exudate, no erythema and lips, buccal mucosa, and tongue normal  NECK: supple, thyroid normal size, non-tender, without nodularity LYMPH:  no palpable lymphadenopathy in the cervical, axillary or inguinal LUNGS: clear to auscultation and percussion (+) mildly labored breathing, no wheezes HEART: regular rate & rhythm and no murmurs and no lower extremity edema ABDOMEN:abdomen soft, non-tender and normal bowel sounds Musculoskeletal:no cyanosis of digits and no clubbing  NEURO: alert & oriented x 3 with fluent speech, no focal motor/sensory deficits  LABORATORY DATA:  I have reviewed the data as listed CBC Latest Ref Rng & Units 09/07/2018 08/24/2018 08/21/2018  WBC 4.0 - 10.5 K/uL 7.3 9.0 4.9  Hemoglobin 12.0 - 15.0 g/dL 7.8(L) 9.0(L) 8.2(L)  Hematocrit 36.0 - 46.0 % 24.5(L) 28.4(L) 26.6(L)  Platelets 150 - 400 K/uL 276 308 182     CMP Latest Ref Rng & Units 09/07/2018 08/24/2018 08/21/2018  Glucose 70 - 99 mg/dL 93 93 87  BUN 8 - 23 mg/dL 8 7(L) 14  Creatinine 0.44 - 1.00 mg/dL 0.82 0.88 1.01(H)  Sodium 135 - 145 mmol/L 139 139 140  Potassium 3.5 - 5.1 mmol/L 3.9 3.7 4.0  Chloride 98 - 111 mmol/L 101 104 109  CO2 22 - 32 mmol/L 28 27 24   Calcium 8.9 - 10.3 mg/dL 9.6 9.9 9.2  Total Protein 6.5 - 8.1 g/dL 6.4(L) 6.6 -  Total Bilirubin 0.3 - 1.2 mg/dL 0.3 <0.2(L) -  Alkaline Phos 38 - 126 U/L 77 79 -  AST 15 - 41 U/L 19 14(L) -  ALT 0 - 44 U/L 14 12 -      RADIOGRAPHIC STUDIES: I have personally reviewed the radiological images as listed and agreed with the findings in the report. No results found.   ASSESSMENT & PLAN:  Jennifer Johns is a 62 y.o. female with   1. 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 She is s/p left partial mastectomy and SLN biopsy -Due to the aggressive nature of triple negative breast cancer, and high risk for recurrence, I recommended adjuvant chemotherapy.   -She has completed adjuvant AC for 4 cycles.  She was admitted for dehydration after cycle 3, and I reduced her cycle 4 dose.  She did not tolerate last cycle well, has significant fatigue, poor appetite, and 6 pounds weight loss. -Labs reviewed, she has symptomatic anemia with hg of 7.8.  I will give blood transfusion today. -Pt presents with mildly labored breaths sounds and cold symptoms also.  I will hold her chemotherapy today, plan to start weekly Taxol next week.  We discussed the option of weekly Taxol for 12 weeks, versus biweekly high-dose Taxol for 4 cycles.  Due to her poor tolerance to chemotherapy, I recommend weekly regimen, she agrees. -F/u in 1-2 weeks    2. Fever and mild URI symptoms  -She has developed mild URI symptoms in the past few days, she was afebrile when she checked in today.  However she was found to have temperature 100.2 before blood transfusion, and further increased to 101.2 after blood transfusion started.  She received Tylenol. -I will call in Levaquin 750 mg daily for 5 days, I instructed patient to call me if she has persistent fever, or symptoms get worse.   3. Anemia, secondary to chemo  -She received blood transfusion on 08/22/18.  -Hg is 7.8 today (09/07/18), will proceed with blood transfusion 1u today   4. COPD, HTN -She will continue her medication, and follow-up with primary care physician. -No wheezes on exam today, but does have slightly labored breathing. Will give breathing treatment in clinic today   5. Low Appetite, Weight loss -Secondary to [facility] -I recommend she start nutritional supplement daily and increase by mouth intake to improve her weight. I will prescription for boost supplement.   Plan -Labs reviewed, Hg at 7.8 in addition to  cold symptoms and weight loss. No chemo treatment today  -Blood transfusion 1u and IV Fluids today  -She will start weekly Taxol next week, and see me back in 2 weeks. -I called in Levaquin 750 mg daily for 5 days to her pharmacy today  No problem-specific Assessment & Plan notes found for this encounter.   Orders Placed This Encounter  Procedures  . Prepare RBC    Standing Status:   Standing    Number of Occurrences:   1    Order Specific Question:   # of Units    Answer:   1 unit    Order Specific Question:   Transfusion Indications    Answer:   Symptomatic Anemia    Order Specific Question:   If emergent release call blood bank    Answer:   Not emergent release  . ABO/Rh   All questions were answered. The patient knows to call the clinic with any problems, questions or concerns. No barriers to learning was detected. I spent 20 minutes counseling the patient face to face. The total time spent in the appointment was 25 minutes and more than 50% was on counseling and review of test results     Truitt Merle, MD 09/07/2018   I, Joslyn Devon, am acting as scribe for Truitt Merle, MD.   I have reviewed the above documentation for accuracy and completeness, and I agree with the above.

## 2018-09-07 NOTE — Progress Notes (Signed)
Patient has temp of 11.1. Contacted Dr. Burr Medico and received verbal order to proceed with blood transfusion. Patient aware the recheck temp at home today and tomorrow and to call with any temp or chills without temp.  15 min post transfusion VS Temp 101.8. Patient denied feeling bad, chills, SOB, or any other symptoms. Reported to Dr. Burr Medico and received verbal order for tylenol 325mg  po.  Benadryl 25 mg po was not administered prior to blood transfusion and Dr. Burr Medico made aware.

## 2018-09-07 NOTE — Patient Instructions (Signed)

## 2018-09-08 ENCOUNTER — Telehealth: Payer: Self-pay | Admitting: Hematology

## 2018-09-08 LAB — TYPE AND SCREEN
ABO/RH(D): O POS
Antibody Screen: NEGATIVE
Unit division: 0

## 2018-09-08 LAB — BPAM RBC
Blood Product Expiration Date: 202001152359
ISSUE DATE / TIME: 201912191602
Unit Type and Rh: 5100

## 2018-09-08 LAB — ABO/RH: ABO/RH(D): O POS

## 2018-09-11 ENCOUNTER — Telehealth: Payer: Self-pay | Admitting: Hematology

## 2018-09-11 NOTE — Progress Notes (Signed)
[place]   [phone] [phone]   Clinic Follow up Note   Patient Care Team: [facility] as PCP - General Coralie Keens, MD as Consulting Physician (General Surgery) Truitt Merle, MD as Consulting Physician (Hematology) 09/14/2018  CHIEF COMPLAINT: F/u on left breast cancer   SUMMARY OF ONCOLOGIC HISTORY:   Cancer of central portion of left female breast ([place])   04/28/2018 Cancer Staging    Staging form: Breast, AJCC 8th Edition - Clinical stage from 04/28/2018: Stage IB (cT1c, cN0, cM0, G3, ER-, PR-, HER2-) - Signed by Truitt Merle, MD on 05/24/2018    05/24/2018 Initial Diagnosis    Cancer of central portion of left female breast ([place])    06/02/2018 Cancer Staging    Staging form: Breast, AJCC 8th Edition - Pathologic stage from 06/02/2018: Stage IIA (pT2, pN0, cM0, G3, ER-, PR-, HER2-) - Signed by Truitt Merle, MD on 06/23/2018    06/07/2018 Surgery    LEFT BREAST PARTIAL MASTECTOMY WITH AXILLARY SENTINEL LYMPH NODE BIOPSY and PAC placement by Dr. Ninfa Linden  06/07/18    06/07/2018 Pathology Results    Diagnosis 06/07/18 1. Breast, partial mastectomy, Left - INVASIVE DUCTAL CARCINOMA, GRADE III, 2.1 CM 1 of 4 FINAL for BLU, MCGLAUN J [phone]) Diagnosis(continued) - SURGICAL RESECTION MARGINS ARE NEGATIVE FOR CARCINOMA. - NEGATIVE FOR LYMPHOVASCULAR OR PERINEURAL INVASION. - BIOPSY SITE CHANGES. - SEE ONCOLOGY TABLE. - SEE NOTE. 2. Lymph node, sentinel, biopsy, Left Axillary - LYMPH NODE, NEGATIVE FOR CARCINOMA (0/1). 3. Lymph node, sentinel, biopsy, Left - LYMPH NODE, NEGATIVE FOR CARCINOMA (0/1). 4. Lymph node, sentinel, biopsy, Left - LYMPH NODE, NEGATIVE FOR CARCINOMA (0/1). 5. Lymph node, sentinel, biopsy, Left - LYMPH NODE, NEGATIVE FOR CARCINOMA (0/1). 6. Lymph node, sentinel, biopsy, Left - LYMPH NODE, NEGATIVE FOR CARCINOMA (0/1). 7. Lymph node, sentinel, biopsy, Left - LYMPH NODE, NEGATIVE FOR  CARCINOMA (0/1). The tumor cells are Negative for Her2 (0). Estrogen Receptor: 0%, NEGATIVE Progesterone Receptor: 0%, NEGATIVE Proliferation Marker Ki67: 40%    06/08/2018 Breast MRI    MRI Breast B/l 05/29/18 IMPRESSION: Solitary mass within the UPPER central portion of the LEFT breast, 2.3 Centimeters in maximum diameter. No MRI evidence for adenopathy. RIGHT breast is negative. LEFT BREAST PARTIAL MASTECTOMY WITH AXILLARY SENTINEL LYMPH NODE BIOPSY with PAC placement by Dr. Ninfa Linden 06/07/18    07/11/2018 Imaging    Whole Body Bone Scan 07/11/18  IMPRESSION: No scintigraphic evidence of osseous metastatic disease.    07/13/2018 -  Chemotherapy    Adjuvant AC every 2 weeks for 4 cycles, 07/13/18-08/24/18. Followed by Taxol every 2 weeks    08/20/2018 Imaging    CT AP W Contrast 08/20/18  IMPRESSION: No acute findings in the abdomen/pelvis. Mild prominence of the main pancreatic duct measuring 6 mm. No evidence of mass, adenopathy or ductal stones. Subtle focal nodularity over the posterior left lower lobe which may be acute or chronic and may be due to an infectious versus atypical infectious or inflammatory process. Recommend follow-up chest CT 4-6 weeks. Two subcentimeter liver hypodensities too small to characterize but likely cysts. Aortic Atherosclerosis (ICD10-I70.0).     CURRENT THERAPY Adjuvant AC every 2 weeks for 4 cycles, 07/13/18-08/24/18. Followed byTaxolweekly starting week of 09/11/18.    INTERVAL HISTORY: Jennifer Johns is a 62 y.o. female who is here for follow-up. Today, she is here alone at infusion room. She is doing well and has more energy. She denies neuropathy in fingers and  toes.    Pertinent positives and negatives of review of systems are listed and detailed within the above HPI.  REVIEW OF SYSTEMS:   Constitutional: Denies fevers, chills or abnormal weight loss Eyes: Denies blurriness of vision Ears, nose, mouth, throat, and face: Denies  mucositis or sore throat Respiratory: Denies cough, dyspnea or wheezes Cardiovascular: Denies palpitation, chest discomfort or lower extremity swelling Gastrointestinal:  Denies nausea, heartburn or change in bowel habits Skin: Denies abnormal skin rashes Lymphatics: Denies new lymphadenopathy or easy bruising Neurological:Denies numbness, tingling or new weaknesses Behavioral/Psych: Mood is stable, no new changes  All other systems were reviewed with the patient and are negative.  MEDICAL HISTORY:  Past Medical History:  Diagnosis Date  . Asthma   . Cancer ([place])    Left Breast  . COPD (chronic obstructive pulmonary disease) ([place])   . Hypertension     SURGICAL HISTORY: Past Surgical History:  Procedure Laterality Date  . COLONOSCOPY N/A 03/18/2016   Procedure: COLONOSCOPY;  Surgeon: Danie Binder, MD;  [facility];  Service: Endoscopy;  Laterality: N/A;  10:30 AM  . PARTIAL MASTECTOMY WITH AXILLARY SENTINEL LYMPH NODE BIOPSY Left 06/07/2018   Procedure: LEFT BREAST PARTIAL MASTECTOMY WITH AXILLARY SENTINEL LYMPH NODE BIOPSY;  Surgeon: Coralie Keens, MD;  Location: [facility];  Service: General;  Laterality: Left;  . PORTACATH PLACEMENT N/A 06/07/2018   Procedure: INSERTION PORT-A-CATH;  Surgeon: Coralie Keens, MD;  Location: [facility];  Service: General;  Laterality: N/A;    I have reviewed the social history and family history with the patient and they are unchanged from previous note.  ALLERGIES:  is allergic to carrot [daucus carota] and other.  MEDICATIONS:  Current Outpatient Medications  Medication Sig Dispense Refill  . albuterol (ACCUNEB) 1.25 MG/3ML nebulizer solution Take 1 ampule by nebulization every 6 (six) hours as needed for wheezing or shortness of breath.     Marland Kitchen albuterol (PROVENTIL HFA;VENTOLIN HFA) 108 (90 Base) MCG/ACT inhaler Inhale 2 puffs into the lungs every 6 (six) hours as needed for wheezing or shortness of breath.     . cholecalciferol  (VITAMIN D) 1000 units tablet Take 1,000 Units by mouth daily.   1  . levofloxacin (LEVAQUIN) 750 MG tablet Take 1 tablet (750 mg total) by mouth daily. 5 tablet 0  . lidocaine-prilocaine (EMLA) cream Apply to affected area once 30 g 3  . lisinopril (PRINIVIL,ZESTRIL) 10 MG tablet Take 10 mg by mouth daily.    Marland Kitchen loperamide (IMODIUM) 2 MG capsule Take 2 tabs with first watery stool then 1 tab after each subsequent watery stool. Do not exceed 8 tabs per day 40 capsule 1  . ondansetron (ZOFRAN) 8 MG tablet Take 1 tablet (8 mg total) by mouth 2 (two) times daily as needed. Start on the third day after chemotherapy. 30 tablet 1  . oxyCODONE (OXY IR/ROXICODONE) 5 MG immediate release tablet Take 1 tablet (5 mg total) by mouth every 6 (six) hours as needed for moderate pain or severe pain. 30 tablet 0  . prochlorperazine (COMPAZINE) 10 MG tablet Take 1 tablet (10 mg total) by mouth every 6 (six) hours as needed (Nausea or vomiting). 30 tablet 1  . ranitidine (ZANTAC) 150 MG tablet Take 150 mg by mouth daily.   1   No current facility-administered medications for this visit.    Facility-Administered Medications Ordered in Other Visits  Medication Dose Route Frequency Provider Last Rate Last Dose  . dexamethasone (DECADRON) injection 10 mg  10 mg Intravenous Once Alla Feeling, NP   10 mg at 09/14/18 1507  . famotidine (PEPCID) IVPB 20 mg premix  20 mg Intravenous Once Truitt Merle, MD      . heparin lock flush 100 unit/mL  500 Units Intracatheter Once PRN Truitt Merle, MD      . PACLitaxel (TAXOL) 138 mg in sodium chloride 0.9 % 250 mL chemo infusion (</= 68m/m2)  80 mg/m2 (Treatment Plan Recorded) Intravenous Once FTruitt Merle MD      . sodium chloride flush (NS) 0.9 % injection 10 mL  10 mL Intracatheter PRN FTruitt Merle MD        PHYSICAL EXAMINATION: ECOG PERFORMANCE STATUS: 2 - Symptomatic, <50% confined to bed  Vitals: BP 111/69 Pulse 105  GENERAL:alert, no distress and comfortable SKIN: skin  color, texture, turgor are normal, no rashes or significant lesions EYES: normal, Conjunctiva are pink and non-injected, sclera clear OROPHARYNX:no exudate, no erythema and lips, buccal mucosa, and tongue normal  NECK: supple, thyroid normal size, non-tender, without nodularity LYMPH:  no palpable lymphadenopathy in the cervical, axillary or inguinal LUNGS: clear to auscultation and percussion with normal breathing effort HEART: regular rate & rhythm and no murmurs and no lower extremity edema ABDOMEN:abdomen soft, non-tender and normal bowel sounds Musculoskeletal:no cyanosis of digits and no clubbing  NEURO: alert & oriented x 3 with fluent speech, no focal motor/sensory deficits  LABORATORY DATA:  I have reviewed the data as listed CBC Latest Ref Rng & Units 09/14/2018 09/07/2018 08/24/2018  WBC 4.0 - 10.5 K/uL 8.3 7.3 9.0  Hemoglobin 12.0 - 15.0 g/dL 9.7(L) 7.8(L) 9.0(L)  Hematocrit 36.0 - 46.0 % 31.5(L) 24.5(L) 28.4(L)  Platelets 150 - 400 K/uL 288 276 308     CMP Latest Ref Rng & Units 09/14/2018 09/07/2018 08/24/2018  Glucose 70 - 99 mg/dL 110(H) 93 93  BUN 8 - 23 mg/dL 13 8 7(L)  Creatinine 0.44 - 1.00 mg/dL 1.22(H) 0.82 0.88  Sodium 135 - 145 mmol/L 139 139 139  Potassium 3.5 - 5.1 mmol/L 3.8 3.9 3.7  Chloride 98 - 111 mmol/L 103 101 104  CO2 22 - 32 mmol/L 29 28 27   Calcium 8.9 - 10.3 mg/dL 9.6 9.6 9.9  Total Protein 6.5 - 8.1 g/dL 6.3(L) 6.4(L) 6.6  Total Bilirubin 0.3 - 1.2 mg/dL 0.2(L) 0.3 <0.2(L)  Alkaline Phos 38 - 126 U/L 58 77 79  AST 15 - 41 U/L 17 19 14(L)  ALT 0 - 44 U/L 12 14 12       RADIOGRAPHIC STUDIES: I have personally reviewed the radiological images as listed and agreed with the findings in the report. No results found.   ASSESSMENT & PLAN:  MAALEEYAH BIASis a 62y.o. female with history of  1. 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 Treated with  lumpectomy with SLN biopsy and adjuvant chemo. She completed adjuvant AC for 4 cycles. Currently on weekly Taxol, planned for a total of 12 weeks. -She has had a moderate side effects from chemo AC, still quite fatigued, and lost more weight.  I encouraged her eat well and stay active. -Labs reviewed, CBC showed Hg 9.7. CMP showed Cr 1.22 GFR 47. I advised her to drink more fluids. Will give IVF today -will start weekly Taxol today  -I also advised her to use ice pack to reduce risk of neuropathy. She agrees.  -f/u next week  2. Anemia, secondary to chemo  -Last blood transfusion was on 09/07/2018. -Labs reviewed, CBC showed Hg 9.7.  3. COPD, HTN -f/u with PCP and continue medications -stable   4. Low Appetite, Weight loss -Secondary to chemo -I encourage her to take nutritional supplements and prescribed Boost supplement for her. -f/u with dietician   Plan  -IVF today -Labs reviewed, good to proceed with Taxol and continue weekly -f/u in one week  No problem-specific Assessment & Plan notes found for this encounter.   No orders of the defined types were placed in this encounter.  All questions were answered. The patient knows to call the clinic with any problems, questions or concerns. No barriers to learning was detected. I spent 20 minutes counseling the patient face to face. The total time spent in the appointment was 25 minutes and more than 50% was on counseling and review of test results  I, Noor Dweik am acting as scribe for Dr. Truitt Merle.  I have reviewed the above documentation for accuracy and completeness, and I agree with the above.     Truitt Merle, MD 09/14/2018

## 2018-09-11 NOTE — Telephone Encounter (Signed)
Spoke with patient daughter about the change in the patient appointment for 12/26.

## 2018-09-14 ENCOUNTER — Inpatient Hospital Stay: Payer: Medicaid Other

## 2018-09-14 ENCOUNTER — Other Ambulatory Visit: Payer: Medicaid Other

## 2018-09-14 ENCOUNTER — Ambulatory Visit: Payer: Medicaid Other

## 2018-09-14 ENCOUNTER — Inpatient Hospital Stay (HOSPITAL_BASED_OUTPATIENT_CLINIC_OR_DEPARTMENT_OTHER): Payer: Medicaid Other | Admitting: Hematology

## 2018-09-14 ENCOUNTER — Ambulatory Visit: Payer: Medicaid Other | Admitting: Hematology

## 2018-09-14 VITALS — BP 127/77 | HR 92 | Temp 98.6°F | Resp 18 | Wt 125.5 lb

## 2018-09-14 DIAGNOSIS — R197 Diarrhea, unspecified: Secondary | ICD-10-CM | POA: Diagnosis not present

## 2018-09-14 DIAGNOSIS — Z171 Estrogen receptor negative status [ER-]: Principal | ICD-10-CM

## 2018-09-14 DIAGNOSIS — Z95828 Presence of other vascular implants and grafts: Secondary | ICD-10-CM

## 2018-09-14 DIAGNOSIS — R634 Abnormal weight loss: Secondary | ICD-10-CM

## 2018-09-14 DIAGNOSIS — D6481 Anemia due to antineoplastic chemotherapy: Secondary | ICD-10-CM

## 2018-09-14 DIAGNOSIS — C50112 Malignant neoplasm of central portion of left female breast: Secondary | ICD-10-CM

## 2018-09-14 DIAGNOSIS — Z5111 Encounter for antineoplastic chemotherapy: Secondary | ICD-10-CM | POA: Diagnosis not present

## 2018-09-14 DIAGNOSIS — D493 Neoplasm of unspecified behavior of breast: Secondary | ICD-10-CM

## 2018-09-14 DIAGNOSIS — J449 Chronic obstructive pulmonary disease, unspecified: Secondary | ICD-10-CM

## 2018-09-14 DIAGNOSIS — I10 Essential (primary) hypertension: Secondary | ICD-10-CM

## 2018-09-14 LAB — CMP (CANCER CENTER ONLY)
ALT: 12 U/L (ref 0–44)
AST: 17 U/L (ref 15–41)
Albumin: 2.9 g/dL — ABNORMAL LOW (ref 3.5–5.0)
Alkaline Phosphatase: 58 U/L (ref 38–126)
Anion gap: 7 (ref 5–15)
BUN: 13 mg/dL (ref 8–23)
CO2: 29 mmol/L (ref 22–32)
Calcium: 9.6 mg/dL (ref 8.9–10.3)
Chloride: 103 mmol/L (ref 98–111)
Creatinine: 1.22 mg/dL — ABNORMAL HIGH (ref 0.44–1.00)
GFR, Est AFR Am: 55 mL/min — ABNORMAL LOW (ref >60)
GFR, Estimated: 47 mL/min — ABNORMAL LOW (ref >60)
Glucose, Bld: 110 mg/dL — ABNORMAL HIGH (ref 70–99)
POTASSIUM: 3.8 mmol/L (ref 3.5–5.1)
Sodium: 139 mmol/L (ref 135–145)
Total Bilirubin: 0.2 mg/dL — ABNORMAL LOW (ref 0.3–1.2)
Total Protein: 6.3 g/dL — ABNORMAL LOW (ref 6.5–8.1)

## 2018-09-14 LAB — CBC WITH DIFFERENTIAL (CANCER CENTER ONLY)
Abs Immature Granulocytes: 0.03 10*3/uL (ref 0.00–0.07)
Basophils Absolute: 0.1 10*3/uL (ref 0.0–0.1)
Basophils Relative: 2 %
Eosinophils Absolute: 0 10*3/uL (ref 0.0–0.5)
Eosinophils Relative: 0 %
HCT: 31.5 % — ABNORMAL LOW (ref 36.0–46.0)
Hemoglobin: 9.7 g/dL — ABNORMAL LOW (ref 12.0–15.0)
Immature Granulocytes: 0 %
Lymphocytes Relative: 8 %
Lymphs Abs: 0.6 10*3/uL — ABNORMAL LOW (ref 0.7–4.0)
MCH: 27.7 pg (ref 26.0–34.0)
MCHC: 30.8 g/dL (ref 30.0–36.0)
MCV: 90 fL (ref 80.0–100.0)
Monocytes Absolute: 1.5 10*3/uL — ABNORMAL HIGH (ref 0.1–1.0)
Monocytes Relative: 18 %
NEUTROS ABS: 6 10*3/uL (ref 1.7–7.7)
Neutrophils Relative %: 72 %
Platelet Count: 288 10*3/uL (ref 150–400)
RBC: 3.5 MIL/uL — ABNORMAL LOW (ref 3.87–5.11)
RDW: 15.7 % — ABNORMAL HIGH (ref 11.5–15.5)
WBC Count: 8.3 10*3/uL (ref 4.0–10.5)
nRBC: 0 % (ref 0.0–0.2)

## 2018-09-14 MED ORDER — DIPHENHYDRAMINE HCL 50 MG/ML IJ SOLN
INTRAMUSCULAR | Status: AC
  Filled 2018-09-14: qty 1

## 2018-09-14 MED ORDER — DEXAMETHASONE SODIUM PHOSPHATE 10 MG/ML IJ SOLN
INTRAMUSCULAR | Status: AC
  Filled 2018-09-14: qty 1

## 2018-09-14 MED ORDER — SODIUM CHLORIDE 0.9 % IV SOLN
80.0000 mg/m2 | Freq: Once | INTRAVENOUS | Status: AC
  Administered 2018-09-14: 138 mg via INTRAVENOUS
  Filled 2018-09-14: qty 23

## 2018-09-14 MED ORDER — SODIUM CHLORIDE 0.9% FLUSH
10.0000 mL | INTRAVENOUS | Status: DC | PRN
  Administered 2018-09-14: 10 mL
  Filled 2018-09-14: qty 10

## 2018-09-14 MED ORDER — FAMOTIDINE IN NACL 20-0.9 MG/50ML-% IV SOLN
INTRAVENOUS | Status: AC
  Filled 2018-09-14: qty 50

## 2018-09-14 MED ORDER — SODIUM CHLORIDE 0.9 % IV SOLN
Freq: Once | INTRAVENOUS | Status: AC
  Administered 2018-09-14: 15:00:00 via INTRAVENOUS
  Filled 2018-09-14: qty 250

## 2018-09-14 MED ORDER — DIPHENHYDRAMINE HCL 50 MG/ML IJ SOLN
50.0000 mg | Freq: Once | INTRAMUSCULAR | Status: AC
  Administered 2018-09-14: 50 mg via INTRAVENOUS

## 2018-09-14 MED ORDER — SODIUM CHLORIDE 0.9 % IV SOLN
INTRAVENOUS | Status: AC
  Administered 2018-09-14: 16:00:00 via INTRAVENOUS
  Filled 2018-09-14 (×2): qty 250

## 2018-09-14 MED ORDER — HEPARIN SOD (PORK) LOCK FLUSH 100 UNIT/ML IV SOLN
500.0000 [IU] | Freq: Once | INTRAVENOUS | Status: AC | PRN
  Administered 2018-09-14: 500 [IU]
  Filled 2018-09-14: qty 5

## 2018-09-14 MED ORDER — FAMOTIDINE IN NACL 20-0.9 MG/50ML-% IV SOLN
20.0000 mg | Freq: Once | INTRAVENOUS | Status: AC
  Administered 2018-09-14: 20 mg via INTRAVENOUS

## 2018-09-14 MED ORDER — DEXAMETHASONE SODIUM PHOSPHATE 10 MG/ML IJ SOLN
10.0000 mg | Freq: Once | INTRAMUSCULAR | Status: AC
  Administered 2018-09-14: 10 mg via INTRAVENOUS

## 2018-09-14 NOTE — Progress Notes (Signed)
Dr. Burr Medico to see pt while in infusion room. OK to initiate treatment prior to MD evaluation

## 2018-09-14 NOTE — Patient Instructions (Signed)
[facility] Discharge Instructions for Patients Receiving Chemotherapy  Today you received the following chemotherapy agents: Paclitaxil (Taxol)  To help prevent nausea and vomiting after your treatment, we encourage you to take your nausea medication as directed. Received Aloxi during treatment today-->Take Compazine (not Zofran) for the next 3 days as needed.    If you develop nausea and vomiting that is not controlled by your nausea medication, call the clinic.   BELOW ARE SYMPTOMS THAT SHOULD BE REPORTED IMMEDIATELY:  *FEVER GREATER THAN 100.5 F  *CHILLS WITH OR WITHOUT FEVER  NAUSEA AND VOMITING THAT IS NOT CONTROLLED WITH YOUR NAUSEA MEDICATION  *UNUSUAL SHORTNESS OF BREATH  *UNUSUAL BRUISING OR BLEEDING  TENDERNESS IN MOUTH AND THROAT WITH OR WITHOUT PRESENCE OF ULCERS  *URINARY PROBLEMS  *BOWEL PROBLEMS  UNUSUAL RASH Items with * indicate a potential emergency and should be followed up as soon as possible.  Feel free to call the clinic should you have any questions or concerns. The clinic phone number is [phone].  Please show the [facility] at check-in to the Emergency Department and triage nurse.  Paclitaxel injection What is this medicine? PACLITAXEL (PAK li TAX el) is a chemotherapy drug. It targets fast dividing cells, like cancer cells, and causes these cells to die. This medicine is used to treat ovarian cancer, breast cancer, lung cancer, Kaposi's sarcoma, and other cancers. This medicine may be used for other purposes; ask your health care provider or pharmacist if you have questions. COMMON BRAND NAME(S): Onxol, Taxol What should I tell my health care provider before I take this medicine? They need to know if you have any of these conditions: -history of irregular heartbeat -liver disease -low blood counts, like low white cell, platelet, or red cell counts -lung or breathing disease, like asthma -tingling of the fingers or toes,  or other nerve disorder -an unusual or allergic reaction to paclitaxel, alcohol, polyoxyethylated castor oil, other chemotherapy, other medicines, foods, dyes, or preservatives -pregnant or trying to get pregnant -breast-feeding How should I use this medicine? This drug is given as an infusion into a vein. It is administered in a hospital or clinic by a specially trained health care professional. Talk to your pediatrician regarding the use of this medicine in children. Special care may be needed. Overdosage: If you think you have taken too much of this medicine contact a poison control center or emergency room at once. NOTE: This medicine is only for you. Do not share this medicine with others. What if I miss a dose? It is important not to miss your dose. Call your doctor or health care professional if you are unable to keep an appointment. What may interact with this medicine? Do not take this medicine with any of the following medications: -disulfiram -metronidazole This medicine may also interact with the following medications: -antiviral medicines for hepatitis, HIV or AIDS -certain antibiotics like erythromycin and clarithromycin -certain medicines for fungal infections like ketoconazole and itraconazole -certain medicines for seizures like carbamazepine, phenobarbital, phenytoin -gemfibrozil -nefazodone -rifampin -St. John's wort This list may not describe all possible interactions. Give your health care provider a list of all the medicines, herbs, non-prescription drugs, or dietary supplements you use. Also tell them if you smoke, drink alcohol, or use illegal drugs. Some items may interact with your medicine. What should I watch for while using this medicine? Your condition will be monitored carefully while you are receiving this medicine. You will need important blood work done  while you are taking this medicine. This medicine can cause serious allergic reactions. To reduce your  risk you will need to take other medicine(s) before treatment with this medicine. If you experience allergic reactions like skin rash, itching or hives, swelling of the face, lips, or tongue, tell your doctor or health care professional right away. In some cases, you may be given additional medicines to help with side effects. Follow all directions for their use. This drug may make you feel generally unwell. This is not uncommon, as chemotherapy can affect healthy cells as well as cancer cells. Report any side effects. Continue your course of treatment even though you feel ill unless your doctor tells you to stop. Call your doctor or health care professional for advice if you get a fever, chills or sore throat, or other symptoms of a cold or flu. Do not treat yourself. This drug decreases your body's ability to fight infections. Try to avoid being around people who are sick. This medicine may increase your risk to bruise or bleed. Call your doctor or health care professional if you notice any unusual bleeding. Be careful brushing and flossing your teeth or using a toothpick because you may get an infection or bleed more easily. If you have any dental work done, tell your dentist you are receiving this medicine. Avoid taking products that contain aspirin, acetaminophen, ibuprofen, naproxen, or ketoprofen unless instructed by your doctor. These medicines may hide a fever. Do not become pregnant while taking this medicine. Women should inform their doctor if they wish to become pregnant or think they might be pregnant. There is a potential for serious side effects to an unborn child. Talk to your health care professional or pharmacist for more information. Do not breast-feed an infant while taking this medicine. Men are advised not to father a child while receiving this medicine. This product may contain alcohol. Ask your pharmacist or healthcare provider if this medicine contains alcohol. Be sure to tell all  healthcare providers you are taking this medicine. Certain medicines, like metronidazole and disulfiram, can cause an unpleasant reaction when taken with alcohol. The reaction includes flushing, headache, nausea, vomiting, sweating, and increased thirst. The reaction can last from 30 minutes to several hours. What side effects may I notice from receiving this medicine? Side effects that you should report to your doctor or health care professional as soon as possible: -allergic reactions like skin rash, itching or hives, swelling of the face, lips, or tongue -breathing problems -changes in vision -fast, irregular heartbeat -high or low blood pressure -mouth sores -pain, tingling, numbness in the hands or feet -signs of decreased platelets or bleeding - bruising, pinpoint red spots on the skin, black, tarry stools, blood in the urine -signs of decreased red blood cells - unusually weak or tired, feeling faint or lightheaded, falls -signs of infection - fever or chills, cough, sore throat, pain or difficulty passing urine -signs and symptoms of liver injury like dark yellow or brown urine; general ill feeling or flu-like symptoms; light-colored stools; loss of appetite; nausea; right upper belly pain; unusually weak or tired; yellowing of the eyes or skin -swelling of the ankles, feet, hands -unusually slow heartbeat Side effects that usually do not require medical attention (report to your doctor or health care professional if they continue or are bothersome): -diarrhea -hair loss -loss of appetite -muscle or joint pain -nausea, vomiting -pain, redness, or irritation at site where injected -tiredness This list may not describe all possible side  effects. Call your doctor for medical advice about side effects. You may report side effects to FDA at 1-800-FDA-1088. Where should I keep my medicine? This drug is given in a hospital or clinic and will not be stored at home. NOTE: This sheet is a  summary. It may not cover all possible information. If you have questions about this medicine, talk to your doctor, pharmacist, or health care provider.  2019 Elsevier/Gold Standard (2017-05-10 13:14:55)

## 2018-09-14 NOTE — Patient Instructions (Signed)

## 2018-09-15 ENCOUNTER — Telehealth: Payer: Self-pay | Admitting: Hematology

## 2018-09-15 ENCOUNTER — Encounter: Payer: Self-pay | Admitting: Hematology

## 2018-09-15 NOTE — Telephone Encounter (Signed)
No los per 12/26 °

## 2018-09-18 ENCOUNTER — Telehealth: Payer: Self-pay | Admitting: Hematology

## 2018-09-18 NOTE — Telephone Encounter (Signed)
[facility] out/YF PAL - per YF cxd 1/2 f/u and moved to 1/9 with next tx. Lab/tx times for 1/2 and 1/9 adjusted. Spoke with patient dtr re change and new times for 1/2 and 1/9.

## 2018-09-21 ENCOUNTER — Inpatient Hospital Stay: Payer: Medicaid Other

## 2018-09-21 ENCOUNTER — Ambulatory Visit: Payer: Medicaid Other | Admitting: Oncology

## 2018-09-21 ENCOUNTER — Inpatient Hospital Stay: Payer: Medicaid Other | Attending: Nurse Practitioner

## 2018-09-21 VITALS — BP 128/93 | HR 100 | Temp 98.2°F | Resp 18

## 2018-09-21 DIAGNOSIS — R739 Hyperglycemia, unspecified: Secondary | ICD-10-CM | POA: Diagnosis not present

## 2018-09-21 DIAGNOSIS — C50112 Malignant neoplasm of central portion of left female breast: Secondary | ICD-10-CM | POA: Insufficient documentation

## 2018-09-21 DIAGNOSIS — Z5111 Encounter for antineoplastic chemotherapy: Secondary | ICD-10-CM | POA: Diagnosis present

## 2018-09-21 DIAGNOSIS — Z171 Estrogen receptor negative status [ER-]: Secondary | ICD-10-CM | POA: Insufficient documentation

## 2018-09-21 DIAGNOSIS — Z95828 Presence of other vascular implants and grafts: Secondary | ICD-10-CM

## 2018-09-21 DIAGNOSIS — J449 Chronic obstructive pulmonary disease, unspecified: Secondary | ICD-10-CM | POA: Diagnosis not present

## 2018-09-21 DIAGNOSIS — R634 Abnormal weight loss: Secondary | ICD-10-CM | POA: Diagnosis not present

## 2018-09-21 DIAGNOSIS — I10 Essential (primary) hypertension: Secondary | ICD-10-CM | POA: Insufficient documentation

## 2018-09-21 DIAGNOSIS — G620 Drug-induced polyneuropathy: Secondary | ICD-10-CM | POA: Diagnosis not present

## 2018-09-21 DIAGNOSIS — D6481 Anemia due to antineoplastic chemotherapy: Secondary | ICD-10-CM | POA: Insufficient documentation

## 2018-09-21 DIAGNOSIS — D493 Neoplasm of unspecified behavior of breast: Secondary | ICD-10-CM

## 2018-09-21 LAB — CBC WITH DIFFERENTIAL (CANCER CENTER ONLY)
Abs Immature Granulocytes: 0.04 10*3/uL (ref 0.00–0.07)
BASOS ABS: 0.1 10*3/uL (ref 0.0–0.1)
BASOS PCT: 1 %
Eosinophils Absolute: 0.3 10*3/uL (ref 0.0–0.5)
Eosinophils Relative: 5 %
HCT: 30.5 % — ABNORMAL LOW (ref 36.0–46.0)
Hemoglobin: 9.4 g/dL — ABNORMAL LOW (ref 12.0–15.0)
Immature Granulocytes: 1 %
Lymphocytes Relative: 10 %
Lymphs Abs: 0.5 10*3/uL — ABNORMAL LOW (ref 0.7–4.0)
MCH: 27.5 pg (ref 26.0–34.0)
MCHC: 30.8 g/dL (ref 30.0–36.0)
MCV: 89.2 fL (ref 80.0–100.0)
Monocytes Absolute: 0.5 10*3/uL (ref 0.1–1.0)
Monocytes Relative: 9 %
NRBC: 0 % (ref 0.0–0.2)
Neutro Abs: 3.8 10*3/uL (ref 1.7–7.7)
Neutrophils Relative %: 74 %
Platelet Count: 258 10*3/uL (ref 150–400)
RBC: 3.42 MIL/uL — ABNORMAL LOW (ref 3.87–5.11)
RDW: 16 % — ABNORMAL HIGH (ref 11.5–15.5)
WBC Count: 5.2 10*3/uL (ref 4.0–10.5)

## 2018-09-21 LAB — CMP (CANCER CENTER ONLY)
ALT: 15 U/L (ref 0–44)
AST: 15 U/L (ref 15–41)
Albumin: 3.1 g/dL — ABNORMAL LOW (ref 3.5–5.0)
Alkaline Phosphatase: 51 U/L (ref 38–126)
Anion gap: 12 (ref 5–15)
BILIRUBIN TOTAL: 0.3 mg/dL (ref 0.3–1.2)
BUN: 9 mg/dL (ref 8–23)
CO2: 25 mmol/L (ref 22–32)
Calcium: 10.1 mg/dL (ref 8.9–10.3)
Chloride: 102 mmol/L (ref 98–111)
Creatinine: 0.95 mg/dL (ref 0.44–1.00)
GFR, Est AFR Am: >60 mL/min (ref >60)
GFR, Estimated: >60 mL/min (ref >60)
Glucose, Bld: 254 mg/dL — ABNORMAL HIGH (ref 70–99)
Potassium: 3.2 mmol/L — ABNORMAL LOW (ref 3.5–5.1)
Sodium: 139 mmol/L (ref 135–145)
TOTAL PROTEIN: 6.4 g/dL — AB (ref 6.5–8.1)

## 2018-09-21 MED ORDER — DIPHENHYDRAMINE HCL 50 MG/ML IJ SOLN
INTRAMUSCULAR | Status: AC
  Filled 2018-09-21: qty 1

## 2018-09-21 MED ORDER — SODIUM CHLORIDE 0.9% FLUSH
10.0000 mL | INTRAVENOUS | Status: DC | PRN
  Administered 2018-09-21: 10 mL
  Filled 2018-09-21: qty 10

## 2018-09-21 MED ORDER — DIPHENHYDRAMINE HCL 50 MG/ML IJ SOLN
50.0000 mg | Freq: Once | INTRAMUSCULAR | Status: AC
  Administered 2018-09-21: 50 mg via INTRAVENOUS

## 2018-09-21 MED ORDER — SODIUM CHLORIDE 0.9 % IV SOLN
Freq: Once | INTRAVENOUS | Status: AC
  Administered 2018-09-21: 11:00:00 via INTRAVENOUS
  Filled 2018-09-21: qty 250

## 2018-09-21 MED ORDER — FAMOTIDINE IN NACL 20-0.9 MG/50ML-% IV SOLN
INTRAVENOUS | Status: AC
  Filled 2018-09-21: qty 50

## 2018-09-21 MED ORDER — DEXAMETHASONE SODIUM PHOSPHATE 10 MG/ML IJ SOLN
INTRAMUSCULAR | Status: AC
  Filled 2018-09-21: qty 1

## 2018-09-21 MED ORDER — HEPARIN SOD (PORK) LOCK FLUSH 100 UNIT/ML IV SOLN
500.0000 [IU] | Freq: Once | INTRAVENOUS | Status: AC | PRN
  Administered 2018-09-21: 500 [IU]
  Filled 2018-09-21: qty 5

## 2018-09-21 MED ORDER — SODIUM CHLORIDE 0.9 % IV SOLN
80.0000 mg/m2 | Freq: Once | INTRAVENOUS | Status: AC
  Administered 2018-09-21: 138 mg via INTRAVENOUS
  Filled 2018-09-21: qty 23

## 2018-09-21 MED ORDER — DEXAMETHASONE SODIUM PHOSPHATE 10 MG/ML IJ SOLN
10.0000 mg | Freq: Once | INTRAMUSCULAR | Status: AC
  Administered 2018-09-21: 10 mg via INTRAVENOUS

## 2018-09-21 MED ORDER — FAMOTIDINE IN NACL 20-0.9 MG/50ML-% IV SOLN
20.0000 mg | Freq: Once | INTRAVENOUS | Status: AC
  Administered 2018-09-21: 20 mg via INTRAVENOUS

## 2018-09-21 NOTE — Patient Instructions (Signed)
[facility] Discharge Instructions for Patients Receiving Chemotherapy  Today you received the following chemotherapy agents:  Taxol.  To help prevent nausea and vomiting after your treatment, we encourage you to take your nausea medication as directed.   If you develop nausea and vomiting that is not controlled by your nausea medication, call the clinic.   BELOW ARE SYMPTOMS THAT SHOULD BE REPORTED IMMEDIATELY:  *FEVER GREATER THAN 100.5 F  *CHILLS WITH OR WITHOUT FEVER  NAUSEA AND VOMITING THAT IS NOT CONTROLLED WITH YOUR NAUSEA MEDICATION  *UNUSUAL SHORTNESS OF BREATH  *UNUSUAL BRUISING OR BLEEDING  TENDERNESS IN MOUTH AND THROAT WITH OR WITHOUT PRESENCE OF ULCERS  *URINARY PROBLEMS  *BOWEL PROBLEMS  UNUSUAL RASH Items with * indicate a potential emergency and should be followed up as soon as possible.  Feel free to call the clinic should you have any questions or concerns. The clinic phone number is [phone].  Please show the CHEMO ALERT CARD at check-in to the Emergency Department and triage nurse.   

## 2018-09-27 NOTE — Progress Notes (Signed)
[place]   Telephone:(336) [phone] Fax:(336) [phone]   Clinic Follow up Note   Patient Care Team: [facility] as PCP - General Coralie Keens, MD as Consulting Physician (General Surgery) Truitt Merle, MD as Consulting Physician (Hematology) 09/28/2018  CHIEF COMPLAINT: F/u on left breast cancer   SUMMARY OF ONCOLOGIC HISTORY:   Cancer of central portion of left female breast ([place])   04/28/2018 Cancer Staging    Staging form: Breast, AJCC 8th Edition - Clinical stage from 04/28/2018: Stage IB (cT1c, cN0, cM0, G3, ER-, PR-, HER2-) - Signed by Truitt Merle, MD on 05/24/2018    05/24/2018 Initial Diagnosis    Cancer of central portion of left female breast ([place])    06/02/2018 Cancer Staging    Staging form: Breast, AJCC 8th Edition - Pathologic stage from 06/02/2018: Stage IIA (pT2, pN0, cM0, G3, ER-, PR-, HER2-) - Signed by Truitt Merle, MD on 06/23/2018    06/07/2018 Surgery    LEFT BREAST PARTIAL MASTECTOMY WITH AXILLARY SENTINEL LYMPH NODE BIOPSY and PAC placement by Dr. Ninfa Linden  06/07/18    06/07/2018 Pathology Results    Diagnosis 06/07/18 1. Breast, partial mastectomy, Left - INVASIVE DUCTAL CARCINOMA, GRADE III, 2.1 CM 1 of 4 FINAL for Jennifer, BARTRAM Johns [phone]) Diagnosis(continued) - SURGICAL RESECTION MARGINS ARE NEGATIVE FOR CARCINOMA. - NEGATIVE FOR LYMPHOVASCULAR OR PERINEURAL INVASION. - BIOPSY SITE CHANGES. - SEE ONCOLOGY TABLE. - SEE NOTE. 2. Lymph node, sentinel, biopsy, Left Axillary - LYMPH NODE, NEGATIVE FOR CARCINOMA (0/1). 3. Lymph node, sentinel, biopsy, Left - LYMPH NODE, NEGATIVE FOR CARCINOMA (0/1). 4. Lymph node, sentinel, biopsy, Left - LYMPH NODE, NEGATIVE FOR CARCINOMA (0/1). 5. Lymph node, sentinel, biopsy, Left - LYMPH NODE, NEGATIVE FOR CARCINOMA (0/1). 6. Lymph node, sentinel, biopsy, Left - LYMPH NODE, NEGATIVE FOR CARCINOMA (0/1). 7. Lymph node, sentinel, biopsy, Left - LYMPH NODE, NEGATIVE FOR  CARCINOMA (0/1). The tumor cells are Negative for Her2 (0). Estrogen Receptor: 0%, NEGATIVE Progesterone Receptor: 0%, NEGATIVE Proliferation Marker Ki67: 40%    06/08/2018 Breast MRI    MRI Breast B/l 05/29/18 IMPRESSION: Solitary mass within the UPPER central portion of the LEFT breast, 2.3 Centimeters in maximum diameter. No MRI evidence for adenopathy. RIGHT breast is negative. LEFT BREAST PARTIAL MASTECTOMY WITH AXILLARY SENTINEL LYMPH NODE BIOPSY with PAC placement by Dr. Ninfa Linden 06/07/18    07/11/2018 Imaging    Whole Body Bone Scan 07/11/18  IMPRESSION: No scintigraphic evidence of osseous metastatic disease.    07/13/2018 -  Chemotherapy    Adjuvant AC every 2 weeks for 4 cycles, 07/13/18-08/24/18. Followed by Taxol every 2 weeks    08/20/2018 Imaging    CT AP W Contrast 08/20/18  IMPRESSION: No acute findings in the abdomen/pelvis. Mild prominence of the main pancreatic duct measuring 6 mm. No evidence of mass, adenopathy or ductal stones. Subtle focal nodularity over the posterior left lower lobe which may be acute or chronic and may be due to an infectious versus atypical infectious or inflammatory process. Recommend follow-up chest CT 4-6 weeks. Two subcentimeter liver hypodensities too small to characterize but likely cysts. Aortic Atherosclerosis (ICD10-I70.0).     CURRENT THERAPY Taxolweeklystartingweek of 09/11/18.   INTERVAL HISTORY: HETHER Johns is a 63 y.o. female who is here for follow-up. Today, she is here with her family member. She is doing well and is tolerating treatment well. Her energy level is good and she denies fatigue or nausea with chemo. She also denies numbness or  tingling. She doesn't use ice packs during chemo infusion. She is eating well and gaining weight.    Pertinent positives and negatives of review of systems are listed and detailed within the above HPI.  REVIEW OF SYSTEMS:   Constitutional: Denies fevers, chills or  abnormal weight loss Eyes: Denies blurriness of vision Ears, nose, mouth, throat, and face: Denies mucositis or sore throat Respiratory: Denies cough, dyspnea or wheezes Cardiovascular: Denies palpitation, chest discomfort or lower extremity swelling Gastrointestinal:  Denies nausea, heartburn or change in bowel habits Skin: Denies abnormal skin rashes Lymphatics: Denies new lymphadenopathy or easy bruising Neurological:Denies numbness, tingling or new weaknesses Behavioral/Psych: Mood is stable, no new changes  All other systems were reviewed with the patient and are negative.  MEDICAL HISTORY:  Past Medical History:  Diagnosis Date  . Asthma   . Cancer ([place])    Left Breast  . COPD (chronic obstructive pulmonary disease) ([place])   . Hypertension     SURGICAL HISTORY: Past Surgical History:  Procedure Laterality Date  . COLONOSCOPY N/A 03/18/2016   Procedure: COLONOSCOPY;  Surgeon: Danie Binder, MD;  [facility];  Service: Endoscopy;  Laterality: N/A;  10:30 AM  . PARTIAL MASTECTOMY WITH AXILLARY SENTINEL LYMPH NODE BIOPSY Left 06/07/2018   Procedure: LEFT BREAST PARTIAL MASTECTOMY WITH AXILLARY SENTINEL LYMPH NODE BIOPSY;  Surgeon: Coralie Keens, MD;  Location: [facility];  Service: General;  Laterality: Left;  . PORTACATH PLACEMENT N/A 06/07/2018   Procedure: INSERTION PORT-A-CATH;  Surgeon: Coralie Keens, MD;  Location: [facility];  Service: General;  Laterality: N/A;    I have reviewed the social history and family history with the patient and they are unchanged from previous note.  ALLERGIES:  is allergic to carrot [daucus carota] and other.  MEDICATIONS:  Current Outpatient Medications  Medication Sig Dispense Refill  . albuterol (ACCUNEB) 1.25 MG/3ML nebulizer solution Take 1 ampule by nebulization every 6 (six) hours as needed for wheezing or shortness of breath.     Marland Kitchen albuterol (PROVENTIL HFA;VENTOLIN HFA) 108 (90 Base) MCG/ACT inhaler Inhale 2 puffs into the  lungs every 6 (six) hours as needed for wheezing or shortness of breath.     . cholecalciferol (VITAMIN D) 1000 units tablet Take 1,000 Units by mouth daily.   1  . lidocaine-prilocaine (EMLA) cream Apply to affected area once 30 g 3  . lisinopril (PRINIVIL,ZESTRIL) 10 MG tablet Take 10 mg by mouth daily.    Marland Kitchen loperamide (IMODIUM) 2 MG capsule Take 2 tabs with first watery stool then 1 tab after each subsequent watery stool. Do not exceed 8 tabs per day 40 capsule 1  . ondansetron (ZOFRAN) 8 MG tablet Take 1 tablet (8 mg total) by mouth 2 (two) times daily as needed. Start on the third day after chemotherapy. 30 tablet 1  . prochlorperazine (COMPAZINE) 10 MG tablet Take 1 tablet (10 mg total) by mouth every 6 (six) hours as needed (Nausea or vomiting). 30 tablet 1  . ranitidine (ZANTAC) 150 MG tablet Take 150 mg by mouth daily.   1   No current facility-administered medications for this visit.     PHYSICAL EXAMINATION: ECOG PERFORMANCE STATUS: 1 - Symptomatic but completely ambulatory  Vitals:   09/28/18 1229 09/28/18 1242  BP: (!) 153/80 (!) 146/83  Pulse: 98   Resp: 17   Temp: 99 F (37.2 C)   SpO2: 94%    Filed Weights   09/28/18 1229  Weight: 130 lb 6.4 oz (59.1 kg)  GENERAL:alert, no distress and comfortable SKIN: skin color, texture, turgor are normal, no rashes or significant lesions EYES: normal, Conjunctiva are pink and non-injected, sclera clear OROPHARYNX:no exudate, no erythema and lips, buccal mucosa, and tongue normal  NECK: supple, thyroid normal size, non-tender, without nodularity LYMPH:  no palpable lymphadenopathy in the cervical, axillary or inguinal LUNGS: clear to auscultation and percussion with normal breathing effort HEART: regular rate & rhythm and no murmurs and no lower extremity edema ABDOMEN:abdomen soft, non-tender and normal bowel sounds Musculoskeletal:no cyanosis of digits and no clubbing  NEURO: alert & oriented x 3 with fluent speech, no  focal motor/sensory deficits  LABORATORY DATA:  I have reviewed the data as listed CBC Latest Ref Rng & Units 09/28/2018 09/21/2018 09/14/2018  WBC 4.0 - 10.5 K/uL 4.6 5.2 8.3  Hemoglobin 12.0 - 15.0 g/dL 8.9(L) 9.4(L) 9.7(L)  Hematocrit 36.0 - 46.0 % 28.2(L) 30.5(L) 31.5(L)  Platelets 150 - 400 K/uL 234 258 288     CMP Latest Ref Rng & Units 09/28/2018 09/21/2018 09/14/2018  Glucose 70 - 99 mg/dL 101(H) 254(H) 110(H)  BUN 8 - 23 mg/dL 14 9 13   Creatinine 0.44 - 1.00 mg/dL 0.79 0.95 1.22(H)  Sodium 135 - 145 mmol/L 140 139 139  Potassium 3.5 - 5.1 mmol/L 3.5 3.2(L) 3.8  Chloride 98 - 111 mmol/L 104 102 103  CO2 22 - 32 mmol/L 28 25 29   Calcium 8.9 - 10.3 mg/dL 9.8 10.1 9.6  Total Protein 6.5 - 8.1 g/dL 6.5 6.4(L) 6.3(L)  Total Bilirubin 0.3 - 1.2 mg/dL 0.3 0.3 0.2(L)  Alkaline Phos 38 - 126 U/L 63 51 58  AST 15 - 41 U/L 17 15 17   ALT 0 - 44 U/L 14 15 12       RADIOGRAPHIC STUDIES: I have personally reviewed the radiological images as listed and agreed with the findings in the report. No results found.   ASSESSMENT & PLAN:  Jennifer Johns is a 63 y.o. female with history of  1. 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 Treated with lumpectomy and adjuvant chemo. She completed adjuvant ACfor 4 cycles. Currently on weekly Taxol. Tolerating well. -Labs reviewed, CBC showed Hg 8.9, slightly worse. CMP WNL -She is tolerating well and is clinically stable, will proceed weeks 3 Taxol today, and continue weekly -He developed hypoglycemia with blood glucose 254 last week, probably related to premedication dexamethasone, random blood glucose 101 today, will continue monitoring.  If she develops worsening hyperglycemia, I may change Taxol to Abraxane. -I encourage patient to use ice pack for his hands and feet during chemo infusion, to reduce risk of neuropathy. -f/u next week  2. Anemia,  secondary to chemo  -last blood transfusion on 09/07/2018 -Labs reviewed, CBC showed Hg 8.9, worse, is asymptomatic, will continue monitoring. -Blood transfusion if hemoglobin less than 8.0  3. COPD, HTN -f/u with PCP and continue medications -stable   4. Low Appetite, Weight loss -secondary to chemo. I previously advised her to use supplements and prescribed Boost supplement for her. -f/u with dietician  5. Hyperglycemia -She has no history of diabetes  -Her CMP from 09/21/2018 showed BG 254.  -I discussed that steroids can worsen her BG, and advised her to monitor her carbohydrate intake.  -I also discussed starting Metformin if needed and discussed side effects like diarrhea and GI upset. -continue monitoring    Plan  -Lab reviewed, adequate for treatment, will proceed Taxol today  and continue weekly -I will reduce her premedication steroids dexa to 94m -f/u in one week then every 2 weeks   No problem-specific Assessment & Plan notes found for this encounter.   No orders of the defined types were placed in this encounter.  All questions were answered. The patient knows to call the clinic with any problems, questions or concerns. No barriers to learning was detected. I spent 20 minutes counseling the patient face to face. The total time spent in the appointment was 25 minutes and more than 50% was on counseling and review of test results  I, Noor Dweik am acting as scribe for Dr. YTruitt Merle  I have reviewed the above documentation for accuracy and completeness, and I agree with the above.     YTruitt Merle MD 09/28/2018

## 2018-09-28 ENCOUNTER — Other Ambulatory Visit: Payer: Medicaid Other

## 2018-09-28 ENCOUNTER — Inpatient Hospital Stay (HOSPITAL_BASED_OUTPATIENT_CLINIC_OR_DEPARTMENT_OTHER): Payer: Medicaid Other | Admitting: Hematology

## 2018-09-28 ENCOUNTER — Ambulatory Visit: Payer: Medicaid Other | Admitting: Hematology

## 2018-09-28 ENCOUNTER — Ambulatory Visit: Payer: Medicaid Other

## 2018-09-28 ENCOUNTER — Telehealth: Payer: Self-pay | Admitting: Hematology

## 2018-09-28 ENCOUNTER — Inpatient Hospital Stay: Payer: Medicaid Other

## 2018-09-28 ENCOUNTER — Encounter: Payer: Self-pay | Admitting: Hematology

## 2018-09-28 VITALS — BP 146/83 | HR 98 | Temp 99.0°F | Resp 17 | Ht 66.0 in | Wt 130.4 lb

## 2018-09-28 DIAGNOSIS — I10 Essential (primary) hypertension: Secondary | ICD-10-CM | POA: Diagnosis not present

## 2018-09-28 DIAGNOSIS — J449 Chronic obstructive pulmonary disease, unspecified: Secondary | ICD-10-CM | POA: Diagnosis not present

## 2018-09-28 DIAGNOSIS — C50112 Malignant neoplasm of central portion of left female breast: Secondary | ICD-10-CM

## 2018-09-28 DIAGNOSIS — R634 Abnormal weight loss: Secondary | ICD-10-CM

## 2018-09-28 DIAGNOSIS — Z95828 Presence of other vascular implants and grafts: Secondary | ICD-10-CM

## 2018-09-28 DIAGNOSIS — Z5111 Encounter for antineoplastic chemotherapy: Secondary | ICD-10-CM | POA: Diagnosis not present

## 2018-09-28 DIAGNOSIS — Z171 Estrogen receptor negative status [ER-]: Secondary | ICD-10-CM | POA: Diagnosis not present

## 2018-09-28 DIAGNOSIS — R739 Hyperglycemia, unspecified: Secondary | ICD-10-CM

## 2018-09-28 DIAGNOSIS — D493 Neoplasm of unspecified behavior of breast: Secondary | ICD-10-CM

## 2018-09-28 LAB — CBC WITH DIFFERENTIAL (CANCER CENTER ONLY)
Abs Immature Granulocytes: 0.02 10*3/uL (ref 0.00–0.07)
Basophils Absolute: 0 10*3/uL (ref 0.0–0.1)
Basophils Relative: 1 %
Eosinophils Absolute: 0.2 10*3/uL (ref 0.0–0.5)
Eosinophils Relative: 4 %
HCT: 28.2 % — ABNORMAL LOW (ref 36.0–46.0)
Hemoglobin: 8.9 g/dL — ABNORMAL LOW (ref 12.0–15.0)
Immature Granulocytes: 0 %
Lymphocytes Relative: 13 %
Lymphs Abs: 0.6 10*3/uL — ABNORMAL LOW (ref 0.7–4.0)
MCH: 28.4 pg (ref 26.0–34.0)
MCHC: 31.6 g/dL (ref 30.0–36.0)
MCV: 90.1 fL (ref 80.0–100.0)
Monocytes Absolute: 0.5 10*3/uL (ref 0.1–1.0)
Monocytes Relative: 11 %
Neutro Abs: 3.2 10*3/uL (ref 1.7–7.7)
Neutrophils Relative %: 71 %
Platelet Count: 234 10*3/uL (ref 150–400)
RBC: 3.13 MIL/uL — ABNORMAL LOW (ref 3.87–5.11)
RDW: 17.6 % — ABNORMAL HIGH (ref 11.5–15.5)
WBC Count: 4.6 10*3/uL (ref 4.0–10.5)
nRBC: 0 % (ref 0.0–0.2)

## 2018-09-28 LAB — CMP (CANCER CENTER ONLY)
ALT: 14 U/L (ref 0–44)
AST: 17 U/L (ref 15–41)
Albumin: 3.3 g/dL — ABNORMAL LOW (ref 3.5–5.0)
Alkaline Phosphatase: 63 U/L (ref 38–126)
Anion gap: 8 (ref 5–15)
BUN: 14 mg/dL (ref 8–23)
CALCIUM: 9.8 mg/dL (ref 8.9–10.3)
CO2: 28 mmol/L (ref 22–32)
Chloride: 104 mmol/L (ref 98–111)
Creatinine: 0.79 mg/dL (ref 0.44–1.00)
GFR, Est AFR Am: >60 mL/min (ref >60)
GFR, Estimated: >60 mL/min (ref >60)
Glucose, Bld: 101 mg/dL — ABNORMAL HIGH (ref 70–99)
Potassium: 3.5 mmol/L (ref 3.5–5.1)
Sodium: 140 mmol/L (ref 135–145)
Total Bilirubin: 0.3 mg/dL (ref 0.3–1.2)
Total Protein: 6.5 g/dL (ref 6.5–8.1)

## 2018-09-28 MED ORDER — HEPARIN SOD (PORK) LOCK FLUSH 100 UNIT/ML IV SOLN
500.0000 [IU] | Freq: Once | INTRAVENOUS | Status: AC | PRN
  Administered 2018-09-28: 500 [IU]
  Filled 2018-09-28: qty 5

## 2018-09-28 MED ORDER — DIPHENHYDRAMINE HCL 50 MG/ML IJ SOLN
INTRAMUSCULAR | Status: AC
  Filled 2018-09-28: qty 1

## 2018-09-28 MED ORDER — SODIUM CHLORIDE 0.9% FLUSH
10.0000 mL | INTRAVENOUS | Status: DC | PRN
  Administered 2018-09-28: 10 mL
  Filled 2018-09-28: qty 10

## 2018-09-28 MED ORDER — SODIUM CHLORIDE 0.9 % IV SOLN
80.0000 mg/m2 | Freq: Once | INTRAVENOUS | Status: AC
  Administered 2018-09-28: 138 mg via INTRAVENOUS
  Filled 2018-09-28: qty 23

## 2018-09-28 MED ORDER — FAMOTIDINE IN NACL 20-0.9 MG/50ML-% IV SOLN
20.0000 mg | Freq: Once | INTRAVENOUS | Status: AC
  Administered 2018-09-28: 20 mg via INTRAVENOUS

## 2018-09-28 MED ORDER — DEXAMETHASONE SODIUM PHOSPHATE 10 MG/ML IJ SOLN
5.0000 mg | Freq: Once | INTRAMUSCULAR | Status: AC
  Administered 2018-09-28: 5 mg via INTRAVENOUS

## 2018-09-28 MED ORDER — DIPHENHYDRAMINE HCL 50 MG/ML IJ SOLN
50.0000 mg | Freq: Once | INTRAMUSCULAR | Status: AC
  Administered 2018-09-28: 50 mg via INTRAVENOUS

## 2018-09-28 MED ORDER — DEXAMETHASONE SODIUM PHOSPHATE 10 MG/ML IJ SOLN
INTRAMUSCULAR | Status: AC
  Filled 2018-09-28: qty 1

## 2018-09-28 MED ORDER — FAMOTIDINE IN NACL 20-0.9 MG/50ML-% IV SOLN
INTRAVENOUS | Status: AC
  Filled 2018-09-28: qty 50

## 2018-09-28 MED ORDER — SODIUM CHLORIDE 0.9 % IV SOLN
Freq: Once | INTRAVENOUS | Status: AC
  Administered 2018-09-28: 13:00:00 via INTRAVENOUS
  Filled 2018-09-28: qty 250

## 2018-09-28 NOTE — Patient Instructions (Signed)
[facility] Discharge Instructions for Patients Receiving Chemotherapy  Today you received the following chemotherapy agents:  Taxol.  To help prevent nausea and vomiting after your treatment, we encourage you to take your nausea medication as directed.   If you develop nausea and vomiting that is not controlled by your nausea medication, call the clinic.   BELOW ARE SYMPTOMS THAT SHOULD BE REPORTED IMMEDIATELY:  *FEVER GREATER THAN 100.5 F  *CHILLS WITH OR WITHOUT FEVER  NAUSEA AND VOMITING THAT IS NOT CONTROLLED WITH YOUR NAUSEA MEDICATION  *UNUSUAL SHORTNESS OF BREATH  *UNUSUAL BRUISING OR BLEEDING  TENDERNESS IN MOUTH AND THROAT WITH OR WITHOUT PRESENCE OF ULCERS  *URINARY PROBLEMS  *BOWEL PROBLEMS  UNUSUAL RASH Items with * indicate a potential emergency and should be followed up as soon as possible.  Feel free to call the clinic should you have any questions or concerns. The clinic phone number is [phone].  Please show the CHEMO ALERT CARD at check-in to the Emergency Department and triage nurse.   

## 2018-09-28 NOTE — Telephone Encounter (Signed)
No los per 01/09.

## 2018-10-02 NOTE — Progress Notes (Signed)
[place]   Telephone:(336) [phone] Fax:(336) [phone]   Clinic Follow up Note   Patient Care Team: [facility] as PCP - General Coralie Keens, MD as Consulting Physician (General Surgery) Truitt Merle, MD as Consulting Physician (Hematology) 10/05/2018  CHIEF COMPLAINT: F/u on left breast cancer   SUMMARY OF ONCOLOGIC HISTORY:   Cancer of central portion of left female breast ([place])   04/28/2018 Cancer Staging    Staging form: Breast, AJCC 8th Edition - Clinical stage from 04/28/2018: Stage IB (cT1c, cN0, cM0, G3, ER-, PR-, HER2-) - Signed by Truitt Merle, MD on 05/24/2018    05/24/2018 Initial Diagnosis    Cancer of central portion of left female breast ([place])    06/02/2018 Cancer Staging    Staging form: Breast, AJCC 8th Edition - Pathologic stage from 06/02/2018: Stage IIA (pT2, pN0, cM0, G3, ER-, PR-, HER2-) - Signed by Truitt Merle, MD on 06/23/2018    06/07/2018 Surgery    LEFT BREAST PARTIAL MASTECTOMY WITH AXILLARY SENTINEL LYMPH NODE BIOPSY and PAC placement by Dr. Ninfa Linden  06/07/18    06/07/2018 Pathology Results    Diagnosis 06/07/18 1. Breast, partial mastectomy, Left - INVASIVE DUCTAL CARCINOMA, GRADE III, 2.1 CM 1 of 4 FINAL for Jennifer Johns, Jennifer Johns [phone]) Diagnosis(continued) - SURGICAL RESECTION MARGINS ARE NEGATIVE FOR CARCINOMA. - NEGATIVE FOR LYMPHOVASCULAR OR PERINEURAL INVASION. - BIOPSY SITE CHANGES. - SEE ONCOLOGY TABLE. - SEE NOTE. 2. Lymph node, sentinel, biopsy, Left Axillary - LYMPH NODE, NEGATIVE FOR CARCINOMA (0/1). 3. Lymph node, sentinel, biopsy, Left - LYMPH NODE, NEGATIVE FOR CARCINOMA (0/1). 4. Lymph node, sentinel, biopsy, Left - LYMPH NODE, NEGATIVE FOR CARCINOMA (0/1). 5. Lymph node, sentinel, biopsy, Left - LYMPH NODE, NEGATIVE FOR CARCINOMA (0/1). 6. Lymph node, sentinel, biopsy, Left - LYMPH NODE, NEGATIVE FOR CARCINOMA (0/1). 7. Lymph node, sentinel, biopsy, Left - LYMPH NODE, NEGATIVE FOR  CARCINOMA (0/1). The tumor cells are Negative for Her2 (0). Estrogen Receptor: 0%, NEGATIVE Progesterone Receptor: 0%, NEGATIVE Proliferation Marker Ki67: 40%    06/08/2018 Breast MRI    MRI Breast B/l 05/29/18 IMPRESSION: Solitary mass within the UPPER central portion of the LEFT breast, 2.3 Centimeters in maximum diameter. No MRI evidence for adenopathy. RIGHT breast is negative. LEFT BREAST PARTIAL MASTECTOMY WITH AXILLARY SENTINEL LYMPH NODE BIOPSY with PAC placement by Dr. Ninfa Linden 06/07/18    07/11/2018 Imaging    Whole Body Bone Scan 07/11/18  IMPRESSION: No scintigraphic evidence of osseous metastatic disease.    07/13/2018 -  Chemotherapy    Adjuvant AC every 2 weeks for 4 cycles, 07/13/18-08/24/18. Followed by Taxol every 2 weeks    08/20/2018 Imaging    CT AP W Contrast 08/20/18  IMPRESSION: No acute findings in the abdomen/pelvis. Mild prominence of the main pancreatic duct measuring 6 mm. No evidence of mass, adenopathy or ductal stones. Subtle focal nodularity over the posterior left lower lobe which may be acute or chronic and may be due to an infectious versus atypical infectious or inflammatory process. Recommend follow-up chest CT 4-6 weeks. Two subcentimeter liver hypodensities too small to characterize but likely cysts. Aortic Atherosclerosis (ICD10-I70.0).     CURRENT THERAPY Taxolweeklystartingweek of 09/11/18.   INTERVAL HISTORY: Jennifer Johns is a 63 y.o. female who is here for follow-up. Today, she is here with her family member. She is doing well but her BP is high. She has not taken her BP medication this morning. She is tolerating chemo well, but complains of tingling  in her fingers and toes. The tingling is intermittent. She denies nausea and diarrhea. She is eating well and her energy level is good. She denies fever or bleeding.  Pertinent positives and negatives of review of systems are listed and detailed within the above HPI.  REVIEW OF  SYSTEMS:   Constitutional: Denies fevers, chills or abnormal weight loss Eyes: Denies blurriness of vision Ears, nose, mouth, throat, and face: Denies mucositis or sore throat Respiratory: Denies cough, dyspnea or wheezes Cardiovascular: Denies palpitation, chest discomfort or lower extremity swelling Gastrointestinal:  Denies nausea, heartburn or change in bowel habits Skin: Denies abnormal skin rashes Lymphatics: Denies new lymphadenopathy or easy bruising Neurological: (+) mild intermittent tingling of fingers and toes Behavioral/Psych: Mood is stable, no new changes  All other systems were reviewed with the patient and are negative.  MEDICAL HISTORY:  Past Medical History:  Diagnosis Date  . Asthma   . Cancer ([place])    Left Breast  . COPD (chronic obstructive pulmonary disease) ([place])   . Hypertension     SURGICAL HISTORY: Past Surgical History:  Procedure Laterality Date  . COLONOSCOPY N/A 03/18/2016   Procedure: COLONOSCOPY;  Surgeon: Danie Binder, MD;  [facility];  Service: Endoscopy;  Laterality: N/A;  10:30 AM  . PARTIAL MASTECTOMY WITH AXILLARY SENTINEL LYMPH NODE BIOPSY Left 06/07/2018   Procedure: LEFT BREAST PARTIAL MASTECTOMY WITH AXILLARY SENTINEL LYMPH NODE BIOPSY;  Surgeon: Coralie Keens, MD;  Location: [place];  Service: General;  Laterality: Left;  . PORTACATH PLACEMENT N/A 06/07/2018   Procedure: INSERTION PORT-A-CATH;  Surgeon: Coralie Keens, MD;  Location: [place];  Service: General;  Laterality: N/A;    I have reviewed the social history and family history with the patient and they are unchanged from previous note.  ALLERGIES:  is allergic to carrot [daucus carota] and other.  MEDICATIONS:  Current Outpatient Medications  Medication Sig Dispense Refill  . albuterol (ACCUNEB) 1.25 MG/3ML nebulizer solution Take 1 ampule by nebulization every 6 (six) hours as needed for wheezing or shortness of breath.     Marland Kitchen albuterol (PROVENTIL  HFA;VENTOLIN HFA) 108 (90 Base) MCG/ACT inhaler Inhale 2 puffs into the lungs every 6 (six) hours as needed for wheezing or shortness of breath.     . cholecalciferol (VITAMIN D) 1000 units tablet Take 1,000 Units by mouth daily.   1  . lidocaine-prilocaine (EMLA) cream Apply to affected area once 30 g 3  . lisinopril (PRINIVIL,ZESTRIL) 10 MG tablet Take 10 mg by mouth daily.    Marland Kitchen loperamide (IMODIUM) 2 MG capsule Take 2 tabs with first watery stool then 1 tab after each subsequent watery stool. Do not exceed 8 tabs per day 40 capsule 1  . ondansetron (ZOFRAN) 8 MG tablet Take 1 tablet (8 mg total) by mouth 2 (two) times daily as needed. Start on the third day after chemotherapy. 30 tablet 1  . prochlorperazine (COMPAZINE) 10 MG tablet Take 1 tablet (10 mg total) by mouth every 6 (six) hours as needed (Nausea or vomiting). 30 tablet 1  . ranitidine (ZANTAC) 150 MG tablet Take 150 mg by mouth daily.   1   No current facility-administered medications for this visit.    Facility-Administered Medications Ordered in Other Visits  Medication Dose Route Frequency Provider Last Rate Last Dose  . diphenhydrAMINE (BENADRYL) injection 50 mg  50 mg Intravenous Once Truitt Merle, MD      . famotidine (PEPCID) IVPB 20 mg premix  20 mg Intravenous  Once Truitt Merle, MD      . heparin lock flush 100 unit/mL  500 Units Intracatheter Once PRN Truitt Merle, MD      . PACLitaxel (TAXOL) 138 mg in sodium chloride 0.9 % 250 mL chemo infusion (</= 9m/m2)  80 mg/m2 (Treatment Plan Recorded) Intravenous Once FTruitt Merle MD      . sodium chloride flush (NS) 0.9 % injection 10 mL  10 mL Intracatheter PRN FTruitt Merle MD        PHYSICAL EXAMINATION: ECOG PERFORMANCE STATUS: 1 - Symptomatic but completely ambulatory  Vitals: BP 146/83 Pulse 98 RR 17  GENERAL:alert, no distress and comfortable SKIN: skin color, texture, turgor are normal, no rashes or significant lesions EYES: normal, Conjunctiva are pink and non-injected,  sclera clear OROPHARYNX:no exudate, no erythema and lips, buccal mucosa, and tongue normal  NECK: supple, thyroid normal size, non-tender, without nodularity LYMPH:  no palpable lymphadenopathy in the cervical, axillary or inguinal LUNGS: clear to auscultation and percussion with normal breathing effort HEART: regular rate & rhythm and no murmurs and no lower extremity edema ABDOMEN:abdomen soft, non-tender and normal bowel sounds Musculoskeletal:no cyanosis of digits and no clubbing  NEURO: alert & oriented x 3 with fluent speech, no focal motor/sensory deficits  LABORATORY DATA:  I have reviewed the data as listed CBC Latest Ref Rng & Units 10/05/2018 09/28/2018 09/21/2018  WBC 4.0 - 10.5 K/uL 4.3 4.6 5.2  Hemoglobin 12.0 - 15.0 g/dL 9.4(L) 8.9(L) 9.4(L)  Hematocrit 36.0 - 46.0 % 29.8(L) 28.2(L) 30.5(L)  Platelets 150 - 400 K/uL 239 234 258     CMP Latest Ref Rng & Units 10/05/2018 09/28/2018 09/21/2018  Glucose 70 - 99 mg/dL 133(H) 101(H) 254(H)  BUN 8 - 23 mg/dL 9 14 9   Creatinine 0.44 - 1.00 mg/dL 0.82 0.79 0.95  Sodium 135 - 145 mmol/L 142 140 139  Potassium 3.5 - 5.1 mmol/L 3.8 3.5 3.2(L)  Chloride 98 - 111 mmol/L 104 104 102  CO2 22 - 32 mmol/L 28 28 25   Calcium 8.9 - 10.3 mg/dL 10.2 9.8 10.1  Total Protein 6.5 - 8.1 g/dL 6.9 6.5 6.4(L)  Total Bilirubin 0.3 - 1.2 mg/dL 0.4 0.3 0.3  Alkaline Phos 38 - 126 U/L 67 63 51  AST 15 - 41 U/L 15 17 15   ALT 0 - 44 U/L 12 14 15       RADIOGRAPHIC STUDIES: I have personally reviewed the radiological images as listed and agreed with the findings in the report. No results found.   ASSESSMENT & PLAN:  MSHAKETHA JEONis a 63y.o. female with history of  1. 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 Treated with lumpectomy and adjuvant chemo, due to high risk of recurrence from triple negative disease.  She completed adjuvant AC for 4 cycles, did  not tolerate well, required dose reduction, blood transfusion and hospitalization.  I did not think she can tolerate carboplatin.  Currently on weekly Taxol. Tolerating well.  Plan for 3 months. -Labs reviewed, CBC showed Hg 9.4. CMP pending -She has started having intermittent neuropathy, likely from Taxol, I encourage her to use ice back during the infusion. -f/u in 2 weeks   2.Anemia, secondary to chemo -Labs reviewed, CBC showed Hg 9.4 - last blood transfusion was on 09/07/2018. -Will consider blood transfusions if Hg <8.  3. COPD, HTN -f/u with PCP and continue medications -stable   4. Low Appetite, Weight loss -  Secondary to chemo. -F/u with dietician and continue nutritional supplements -weight stable lately   5. Hyperglycemia -No history of DM. Likely related to steroids. -I reduced her premedication steroids to 5 mg, BG improved, random blood glucose 133 today -will monitor  Plan  -Labs reviewed, good to proceed with chemo today and continue weekly -use ice pack during the Taxol infusion -See her back in 2 weeks  No problem-specific Assessment & Plan notes found for this encounter.   No orders of the defined types were placed in this encounter.  All questions were answered. The patient knows to call the clinic with any problems, questions or concerns. No barriers to learning was detected. I spent 15 minutes counseling the patient face to face. The total time spent in the appointment was 20 minutes and more than 50% was on counseling and review of test results  I, Noor Dweik am acting as scribe for Dr. Truitt Merle.  I have reviewed the above documentation for accuracy and completeness, and I agree with the above.     Truitt Merle, MD 10/05/2018

## 2018-10-04 ENCOUNTER — Other Ambulatory Visit: Payer: Self-pay | Admitting: Hematology

## 2018-10-04 DIAGNOSIS — C50112 Malignant neoplasm of central portion of left female breast: Secondary | ICD-10-CM

## 2018-10-04 DIAGNOSIS — Z171 Estrogen receptor negative status [ER-]: Principal | ICD-10-CM

## 2018-10-05 ENCOUNTER — Inpatient Hospital Stay: Payer: Medicaid Other

## 2018-10-05 ENCOUNTER — Inpatient Hospital Stay (HOSPITAL_BASED_OUTPATIENT_CLINIC_OR_DEPARTMENT_OTHER): Payer: Medicaid Other | Admitting: Hematology

## 2018-10-05 ENCOUNTER — Encounter: Payer: Self-pay | Admitting: Hematology

## 2018-10-05 VITALS — BP 173/96 | HR 106 | Temp 98.4°F | Resp 18 | Ht 66.0 in | Wt 130.4 lb

## 2018-10-05 VITALS — BP 156/89 | HR 102 | Temp 98.8°F | Resp 98

## 2018-10-05 DIAGNOSIS — C50112 Malignant neoplasm of central portion of left female breast: Secondary | ICD-10-CM | POA: Diagnosis not present

## 2018-10-05 DIAGNOSIS — Z95828 Presence of other vascular implants and grafts: Secondary | ICD-10-CM

## 2018-10-05 DIAGNOSIS — J449 Chronic obstructive pulmonary disease, unspecified: Secondary | ICD-10-CM

## 2018-10-05 DIAGNOSIS — Z171 Estrogen receptor negative status [ER-]: Secondary | ICD-10-CM | POA: Diagnosis not present

## 2018-10-05 DIAGNOSIS — I10 Essential (primary) hypertension: Secondary | ICD-10-CM | POA: Diagnosis not present

## 2018-10-05 DIAGNOSIS — G620 Drug-induced polyneuropathy: Secondary | ICD-10-CM

## 2018-10-05 DIAGNOSIS — Z5111 Encounter for antineoplastic chemotherapy: Secondary | ICD-10-CM | POA: Diagnosis not present

## 2018-10-05 DIAGNOSIS — R739 Hyperglycemia, unspecified: Secondary | ICD-10-CM

## 2018-10-05 DIAGNOSIS — R634 Abnormal weight loss: Secondary | ICD-10-CM

## 2018-10-05 LAB — CBC WITH DIFFERENTIAL (CANCER CENTER ONLY)
Abs Immature Granulocytes: 0.02 10*3/uL (ref 0.00–0.07)
Basophils Absolute: 0 10*3/uL (ref 0.0–0.1)
Basophils Relative: 1 %
Eosinophils Absolute: 0.1 10*3/uL (ref 0.0–0.5)
Eosinophils Relative: 2 %
HCT: 29.8 % — ABNORMAL LOW (ref 36.0–46.0)
HEMOGLOBIN: 9.4 g/dL — AB (ref 12.0–15.0)
Immature Granulocytes: 1 %
Lymphocytes Relative: 14 %
Lymphs Abs: 0.6 10*3/uL — ABNORMAL LOW (ref 0.7–4.0)
MCH: 28.5 pg (ref 26.0–34.0)
MCHC: 31.5 g/dL (ref 30.0–36.0)
MCV: 90.3 fL (ref 80.0–100.0)
Monocytes Absolute: 0.4 10*3/uL (ref 0.1–1.0)
Monocytes Relative: 9 %
Neutro Abs: 3.1 10*3/uL (ref 1.7–7.7)
Neutrophils Relative %: 73 %
Platelet Count: 239 10*3/uL (ref 150–400)
RBC: 3.3 MIL/uL — ABNORMAL LOW (ref 3.87–5.11)
RDW: 18.4 % — ABNORMAL HIGH (ref 11.5–15.5)
WBC Count: 4.3 10*3/uL (ref 4.0–10.5)
nRBC: 0 % (ref 0.0–0.2)

## 2018-10-05 LAB — CMP (CANCER CENTER ONLY)
ALT: 12 U/L (ref 0–44)
AST: 15 U/L (ref 15–41)
Albumin: 3.6 g/dL (ref 3.5–5.0)
Alkaline Phosphatase: 67 U/L (ref 38–126)
Anion gap: 10 (ref 5–15)
BUN: 9 mg/dL (ref 8–23)
CO2: 28 mmol/L (ref 22–32)
Calcium: 10.2 mg/dL (ref 8.9–10.3)
Chloride: 104 mmol/L (ref 98–111)
Creatinine: 0.82 mg/dL (ref 0.44–1.00)
GFR, Est AFR Am: >60 mL/min (ref >60)
GFR, Estimated: >60 mL/min (ref >60)
Glucose, Bld: 133 mg/dL — ABNORMAL HIGH (ref 70–99)
POTASSIUM: 3.8 mmol/L (ref 3.5–5.1)
Sodium: 142 mmol/L (ref 135–145)
Total Bilirubin: 0.4 mg/dL (ref 0.3–1.2)
Total Protein: 6.9 g/dL (ref 6.5–8.1)

## 2018-10-05 MED ORDER — SODIUM CHLORIDE 0.9 % IV SOLN
80.0000 mg/m2 | Freq: Once | INTRAVENOUS | Status: AC
  Administered 2018-10-05: 138 mg via INTRAVENOUS
  Filled 2018-10-05: qty 23

## 2018-10-05 MED ORDER — HEPARIN SOD (PORK) LOCK FLUSH 100 UNIT/ML IV SOLN
500.0000 [IU] | Freq: Once | INTRAVENOUS | Status: AC | PRN
  Administered 2018-10-05: 500 [IU]
  Filled 2018-10-05: qty 5

## 2018-10-05 MED ORDER — DIPHENHYDRAMINE HCL 50 MG/ML IJ SOLN
50.0000 mg | Freq: Once | INTRAMUSCULAR | Status: AC
  Administered 2018-10-05: 50 mg via INTRAVENOUS

## 2018-10-05 MED ORDER — SODIUM CHLORIDE 0.9% FLUSH
10.0000 mL | INTRAVENOUS | Status: DC | PRN
  Administered 2018-10-05: 10 mL
  Filled 2018-10-05: qty 10

## 2018-10-05 MED ORDER — FAMOTIDINE IN NACL 20-0.9 MG/50ML-% IV SOLN
INTRAVENOUS | Status: AC
  Filled 2018-10-05: qty 50

## 2018-10-05 MED ORDER — DEXAMETHASONE SODIUM PHOSPHATE 10 MG/ML IJ SOLN
5.0000 mg | Freq: Once | INTRAMUSCULAR | Status: AC
  Administered 2018-10-05: 5 mg via INTRAVENOUS

## 2018-10-05 MED ORDER — DEXAMETHASONE SODIUM PHOSPHATE 10 MG/ML IJ SOLN
INTRAMUSCULAR | Status: AC
  Filled 2018-10-05: qty 1

## 2018-10-05 MED ORDER — FAMOTIDINE IN NACL 20-0.9 MG/50ML-% IV SOLN
20.0000 mg | Freq: Once | INTRAVENOUS | Status: AC
  Administered 2018-10-05: 20 mg via INTRAVENOUS

## 2018-10-05 MED ORDER — SODIUM CHLORIDE 0.9 % IV SOLN
Freq: Once | INTRAVENOUS | Status: AC
  Administered 2018-10-05: 10:00:00 via INTRAVENOUS
  Filled 2018-10-05: qty 250

## 2018-10-05 MED ORDER — DIPHENHYDRAMINE HCL 50 MG/ML IJ SOLN
INTRAMUSCULAR | Status: AC
  Filled 2018-10-05: qty 1

## 2018-10-05 NOTE — Patient Instructions (Signed)
[facility] Discharge Instructions for Patients Receiving Chemotherapy  Today you received the following chemotherapy agents:  Taxol.  To help prevent nausea and vomiting after your treatment, we encourage you to take your nausea medication as directed.   If you develop nausea and vomiting that is not controlled by your nausea medication, call the clinic.   BELOW ARE SYMPTOMS THAT SHOULD BE REPORTED IMMEDIATELY:  *FEVER GREATER THAN 100.5 F  *CHILLS WITH OR WITHOUT FEVER  NAUSEA AND VOMITING THAT IS NOT CONTROLLED WITH YOUR NAUSEA MEDICATION  *UNUSUAL SHORTNESS OF BREATH  *UNUSUAL BRUISING OR BLEEDING  TENDERNESS IN MOUTH AND THROAT WITH OR WITHOUT PRESENCE OF ULCERS  *URINARY PROBLEMS  *BOWEL PROBLEMS  UNUSUAL RASH Items with * indicate a potential emergency and should be followed up as soon as possible.  Feel free to call the clinic should you have any questions or concerns. The clinic phone number is [phone].  Please show the CHEMO ALERT CARD at check-in to the Emergency Department and triage nurse.   

## 2018-10-06 ENCOUNTER — Telehealth: Payer: Self-pay | Admitting: Hematology

## 2018-10-06 NOTE — Telephone Encounter (Signed)
No los per 01/16. °

## 2018-10-12 ENCOUNTER — Inpatient Hospital Stay: Payer: Medicaid Other

## 2018-10-12 VITALS — BP 152/91 | HR 100 | Temp 98.4°F | Resp 20 | Wt 131.8 lb

## 2018-10-12 DIAGNOSIS — Z5111 Encounter for antineoplastic chemotherapy: Secondary | ICD-10-CM | POA: Diagnosis not present

## 2018-10-12 DIAGNOSIS — C50112 Malignant neoplasm of central portion of left female breast: Secondary | ICD-10-CM

## 2018-10-12 DIAGNOSIS — Z171 Estrogen receptor negative status [ER-]: Principal | ICD-10-CM

## 2018-10-12 DIAGNOSIS — Z95828 Presence of other vascular implants and grafts: Secondary | ICD-10-CM

## 2018-10-12 LAB — CBC WITH DIFFERENTIAL (CANCER CENTER ONLY)
Abs Immature Granulocytes: 0.02 10*3/uL (ref 0.00–0.07)
Basophils Absolute: 0 10*3/uL (ref 0.0–0.1)
Basophils Relative: 1 %
Eosinophils Absolute: 0.1 10*3/uL (ref 0.0–0.5)
Eosinophils Relative: 2 %
HCT: 27.9 % — ABNORMAL LOW (ref 36.0–46.0)
Hemoglobin: 8.8 g/dL — ABNORMAL LOW (ref 12.0–15.0)
Immature Granulocytes: 1 %
Lymphocytes Relative: 19 %
Lymphs Abs: 0.6 10*3/uL — ABNORMAL LOW (ref 0.7–4.0)
MCH: 29.5 pg (ref 26.0–34.0)
MCHC: 31.5 g/dL (ref 30.0–36.0)
MCV: 93.6 fL (ref 80.0–100.0)
Monocytes Absolute: 0.4 10*3/uL (ref 0.1–1.0)
Monocytes Relative: 11 %
Neutro Abs: 2.2 10*3/uL (ref 1.7–7.7)
Neutrophils Relative %: 66 %
Platelet Count: 223 10*3/uL (ref 150–400)
RBC: 2.98 MIL/uL — ABNORMAL LOW (ref 3.87–5.11)
RDW: 19.1 % — ABNORMAL HIGH (ref 11.5–15.5)
WBC Count: 3.2 10*3/uL — ABNORMAL LOW (ref 4.0–10.5)
nRBC: 0 % (ref 0.0–0.2)

## 2018-10-12 LAB — CMP (CANCER CENTER ONLY)
ALT: 12 U/L (ref 0–44)
AST: 12 U/L — ABNORMAL LOW (ref 15–41)
Albumin: 3.5 g/dL (ref 3.5–5.0)
Alkaline Phosphatase: 79 U/L (ref 38–126)
Anion gap: 7 (ref 5–15)
BUN: 14 mg/dL (ref 8–23)
CO2: 26 mmol/L (ref 22–32)
Calcium: 9.9 mg/dL (ref 8.9–10.3)
Chloride: 109 mmol/L (ref 98–111)
Creatinine: 0.97 mg/dL (ref 0.44–1.00)
GFR, Est AFR Am: >60 mL/min (ref >60)
GFR, Estimated: >60 mL/min (ref >60)
Glucose, Bld: 83 mg/dL (ref 70–99)
Potassium: 3.8 mmol/L (ref 3.5–5.1)
Sodium: 142 mmol/L (ref 135–145)
Total Bilirubin: 0.2 mg/dL — ABNORMAL LOW (ref 0.3–1.2)
Total Protein: 6.8 g/dL (ref 6.5–8.1)

## 2018-10-12 MED ORDER — SODIUM CHLORIDE 0.9 % IV SOLN
80.0000 mg/m2 | Freq: Once | INTRAVENOUS | Status: AC
  Administered 2018-10-12: 138 mg via INTRAVENOUS
  Filled 2018-10-12: qty 23

## 2018-10-12 MED ORDER — HEPARIN SOD (PORK) LOCK FLUSH 100 UNIT/ML IV SOLN
500.0000 [IU] | Freq: Once | INTRAVENOUS | Status: AC | PRN
  Administered 2018-10-12: 500 [IU]
  Filled 2018-10-12: qty 5

## 2018-10-12 MED ORDER — DIPHENHYDRAMINE HCL 50 MG/ML IJ SOLN
50.0000 mg | Freq: Once | INTRAMUSCULAR | Status: AC
  Administered 2018-10-12: 50 mg via INTRAVENOUS

## 2018-10-12 MED ORDER — DEXAMETHASONE SODIUM PHOSPHATE 10 MG/ML IJ SOLN
5.0000 mg | Freq: Once | INTRAMUSCULAR | Status: AC
  Administered 2018-10-12: 5 mg via INTRAVENOUS

## 2018-10-12 MED ORDER — SODIUM CHLORIDE 0.9 % IV SOLN
Freq: Once | INTRAVENOUS | Status: AC
  Administered 2018-10-12: 10:00:00 via INTRAVENOUS
  Filled 2018-10-12: qty 250

## 2018-10-12 MED ORDER — FAMOTIDINE IN NACL 20-0.9 MG/50ML-% IV SOLN
20.0000 mg | Freq: Once | INTRAVENOUS | Status: AC
  Administered 2018-10-12: 20 mg via INTRAVENOUS

## 2018-10-12 MED ORDER — FAMOTIDINE IN NACL 20-0.9 MG/50ML-% IV SOLN
INTRAVENOUS | Status: AC
  Filled 2018-10-12: qty 50

## 2018-10-12 MED ORDER — DIPHENHYDRAMINE HCL 50 MG/ML IJ SOLN
INTRAMUSCULAR | Status: AC
  Filled 2018-10-12: qty 1

## 2018-10-12 MED ORDER — SODIUM CHLORIDE 0.9% FLUSH
10.0000 mL | INTRAVENOUS | Status: DC | PRN
  Administered 2018-10-12: 10 mL
  Filled 2018-10-12: qty 10

## 2018-10-12 MED ORDER — DEXAMETHASONE SODIUM PHOSPHATE 10 MG/ML IJ SOLN
INTRAMUSCULAR | Status: AC
  Filled 2018-10-12: qty 1

## 2018-10-12 NOTE — Patient Instructions (Signed)
[facility] Discharge Instructions for Patients Receiving Chemotherapy  Today you received the following chemotherapy agents Paclitaxel (TAXOL).  To help prevent nausea and vomiting after your treatment, we encourage you to take your nausea medication as prescribed.  If you develop nausea and vomiting that is not controlled by your nausea medication, call the clinic.   BELOW ARE SYMPTOMS THAT SHOULD BE REPORTED IMMEDIATELY:  *FEVER GREATER THAN 100.5 F  *CHILLS WITH OR WITHOUT FEVER  NAUSEA AND VOMITING THAT IS NOT CONTROLLED WITH YOUR NAUSEA MEDICATION  *UNUSUAL SHORTNESS OF BREATH  *UNUSUAL BRUISING OR BLEEDING  TENDERNESS IN MOUTH AND THROAT WITH OR WITHOUT PRESENCE OF ULCERS  *URINARY PROBLEMS  *BOWEL PROBLEMS  UNUSUAL RASH Items with * indicate a potential emergency and should be followed up as soon as possible.  Feel free to call the clinic should you have any questions or concerns. The clinic phone number is [phone].  Please show the CHEMO ALERT CARD at check-in to the Emergency Department and triage nurse.   

## 2018-10-17 NOTE — Progress Notes (Signed)
[facility]   [phone] [phone]   Clinic Follow up Note   Patient Care Team: [facility] as PCP - General Coralie Keens, MD as Consulting Physician (General Surgery) Truitt Merle, MD as Consulting Physician (Hematology) 10/19/2018  CHIEF COMPLAINT: F/u on left breast cancer   SUMMARY OF ONCOLOGIC HISTORY:   Cancer of central portion of left female breast ([place])   04/28/2018 Cancer Staging    Staging form: Breast, AJCC 8th Edition - Clinical stage from 04/28/2018: Stage IB (cT1c, cN0, cM0, G3, ER-, PR-, HER2-) - Signed by Truitt Merle, MD on 05/24/2018    05/24/2018 Initial Diagnosis    Cancer of central portion of left female breast ([place])    06/02/2018 Cancer Staging    Staging form: Breast, AJCC 8th Edition - Pathologic stage from 06/02/2018: Stage IIA (pT2, pN0, cM0, G3, ER-, PR-, HER2-) - Signed by Truitt Merle, MD on 06/23/2018    06/07/2018 Surgery    LEFT BREAST PARTIAL MASTECTOMY WITH AXILLARY SENTINEL LYMPH NODE BIOPSY and PAC placement by Dr. Ninfa Linden  06/07/18    06/07/2018 Pathology Results    Diagnosis 06/07/18 1. 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 - NEGATIVE FOR LYMPHOVASCULAR OR PERINEURAL INVASION. - BIOPSY SITE CHANGES. - SEE ONCOLOGY TABLE. - SEE NOTE. 2. Lymph node, sentinel, biopsy, Left Axillary - LYMPH NODE, NEGATIVE FOR CARCINOMA (0/1). 3. Lymph node, sentinel, biopsy, Left - LYMPH NODE, NEGATIVE FOR CARCINOMA (0/1). 4. Lymph node, sentinel, biopsy, Left - LYMPH NODE, NEGATIVE FOR CARCINOMA (0/1). 5. Lymph node, sentinel, biopsy, Left - LYMPH NODE, NEGATIVE FOR CARCINOMA (0/1). 6. Lymph node, sentinel, biopsy, Left - LYMPH NODE, NEGATIVE FOR CARCINOMA (0/1). 7. Lymph node, sentinel, biopsy, Left - LYMPH NODE, NEGATIVE FOR  CARCINOMA (0/1). The tumor cells are Negative for Her2 (0). Estrogen Receptor: 0%, NEGATIVE Progesterone Receptor: 0%, NEGATIVE Proliferation Marker Ki67: 40%    06/08/2018 Breast MRI    MRI Breast B/l 05/29/18 IMPRESSION: Solitary mass within the UPPER central portion of the LEFT breast, 2.3 Centimeters in maximum diameter. No MRI evidence for adenopathy. RIGHT breast is negative. LEFT BREAST PARTIAL MASTECTOMY WITH AXILLARY SENTINEL LYMPH NODE BIOPSY with PAC placement by Dr. Ninfa Linden 06/07/18    07/11/2018 Imaging    Whole Body Bone Scan 07/11/18  IMPRESSION: No scintigraphic evidence of osseous metastatic disease.    07/13/2018 -  Chemotherapy    Adjuvant AC every 2 weeks for 4 cycles, 07/13/18-08/24/18. Followed by Taxol every 2 weeks    08/20/2018 Imaging    CT AP W Contrast 08/20/18  IMPRESSION: No acute findings in the abdomen/pelvis. Mild prominence of the main pancreatic duct measuring 6 mm. No evidence of mass, adenopathy or ductal stones. Subtle focal nodularity over the posterior left lower lobe which may be acute or chronic and may be due to an infectious versus atypical infectious or inflammatory process. Recommend follow-up chest CT 4-6 weeks. Two subcentimeter liver hypodensities too small to characterize but likely cysts. Aortic Atherosclerosis (ICD10-I70.0).     CURRENT THERAPY Taxolweeklystartingweek of 09/11/18.   INTERVAL HISTORY: Jennifer Johns is a 63 y.o. female who is here for follow-up. Today, she is here with her family member. She is doing well. She is able to carry out daily activities at home. She experiences intermittent mild tingling in her fingers and big toes. She denies  difficulties walking or using her hands. She uses ice packs during infusion. She denies fever or chills.    Pertinent positives and negatives of review of systems are listed and detailed within the above HPI.  REVIEW OF SYSTEMS:   Constitutional: Denies fevers, chills  or abnormal weight loss Eyes: Denies blurriness of vision Ears, nose, mouth, throat, and face: Denies mucositis or sore throat Respiratory: Denies cough, dyspnea or wheezes Cardiovascular: Denies palpitation, chest discomfort or lower extremity swelling Gastrointestinal:  Denies nausea, heartburn or change in bowel habits Skin: Denies abnormal skin rashes Lymphatics: Denies new lymphadenopathy or easy bruising Neurological:Denies numbness new weaknesses (+) mild intermittent tingling in fingers and big toes Behavioral/Psych: Mood is stable, no new changes  All other systems were reviewed with the patient and are negative.  MEDICAL HISTORY:  Past Medical History:  Diagnosis Date  . Asthma   . Cancer ([place])    Left Breast  . COPD (chronic obstructive pulmonary disease) ([place])   . Hypertension     SURGICAL HISTORY: Past Surgical History:  Procedure Laterality Date  . COLONOSCOPY N/A 03/18/2016   Procedure: COLONOSCOPY;  Surgeon: Danie Binder, MD;  [facility];  Service: Endoscopy;  Laterality: N/A;  10:30 AM  . PARTIAL MASTECTOMY WITH AXILLARY SENTINEL LYMPH NODE BIOPSY Left 06/07/2018   Procedure: LEFT BREAST PARTIAL MASTECTOMY WITH AXILLARY SENTINEL LYMPH NODE BIOPSY;  Surgeon: Coralie Keens, MD;  Location: [facility];  Service: General;  Laterality: Left;  . PORTACATH PLACEMENT N/A 06/07/2018   Procedure: INSERTION PORT-A-CATH;  Surgeon: Coralie Keens, MD;  Location: [facility];  Service: General;  Laterality: N/A;    I have reviewed the social history and family history with the patient and they are unchanged from previous note.  ALLERGIES:  is allergic to carrot [daucus carota] and other.  MEDICATIONS:  Current Outpatient Medications  Medication Sig Dispense Refill  . albuterol (ACCUNEB) 1.25 MG/3ML nebulizer solution Take 1 ampule by nebulization every 6 (six) hours as needed for wheezing or shortness of breath.     Marland Kitchen albuterol (PROVENTIL HFA;VENTOLIN HFA) 108 (90  Base) MCG/ACT inhaler Inhale 2 puffs into the lungs every 6 (six) hours as needed for wheezing or shortness of breath.     . cholecalciferol (VITAMIN D) 1000 units tablet Take 1,000 Units by mouth daily.   1  . lidocaine-prilocaine (EMLA) cream Apply to affected area once 30 g 3  . lisinopril (PRINIVIL,ZESTRIL) 10 MG tablet Take 10 mg by mouth daily.    Marland Kitchen loperamide (IMODIUM) 2 MG capsule Take 2 tabs with first watery stool then 1 tab after each subsequent watery stool. Do not exceed 8 tabs per day 40 capsule 1  . ondansetron (ZOFRAN) 8 MG tablet Take 1 tablet (8 mg total) by mouth 2 (two) times daily as needed. Start on the third day after chemotherapy. 30 tablet 1  . prochlorperazine (COMPAZINE) 10 MG tablet Take 1 tablet (10 mg total) by mouth every 6 (six) hours as needed (Nausea or vomiting). 30 tablet 1  . ranitidine (ZANTAC) 150 MG tablet Take 150 mg by mouth daily.   1   No current facility-administered medications for this visit.    Facility-Administered Medications Ordered in Other Visits  Medication Dose Route Frequency Provider Last Rate Last Dose  . heparin lock flush 100 unit/mL  500 Units Intracatheter Once PRN Truitt Merle, MD      . PACLitaxel (TAXOL) 138 mg in sodium chloride 0.9 % 250 mL chemo infusion (</= 37m/m2)  80 mg/m2 (Treatment Plan Recorded) Intravenous Once Truitt Merle, MD 273 mL/hr at 10/19/18 1026 138 mg at 10/19/18 1026  . sodium chloride flush (NS) 0.9 % injection 10 mL  10 mL Intracatheter PRN Truitt Merle, MD        PHYSICAL EXAMINATION: ECOG PERFORMANCE STATUS: 1 - Symptomatic but completely ambulatory  Vitals:   10/19/18 0839  BP: (!) 141/98  Pulse: 96  Resp: 18  Temp: 98.2 F (36.8 C)  SpO2: 98%   Filed Weights   10/19/18 0839  Weight: 133 lb 8 oz (60.6 kg)    GENERAL:alert, no distress and comfortable SKIN: skin color, texture, turgor are normal, no rashes or significant lesions EYES: normal, Conjunctiva are pink and non-injected, sclera  clear OROPHARYNX:no exudate, no erythema and lips, buccal mucosa, and tongue normal  NECK: supple, thyroid normal size, non-tender, without nodularity LYMPH:  no palpable lymphadenopathy in the cervical, axillary or inguinal LUNGS: clear to auscultation and percussion with normal breathing effort HEART: regular rate & rhythm and no murmurs and no lower extremity edema ABDOMEN:abdomen soft, non-tender and normal bowel sounds Musculoskeletal:no cyanosis of digits and no clubbing  NEURO: alert & oriented x 3 with fluent speech, no focal motor/sensory deficits. Good bilateral sense of vibration in hands   LABORATORY DATA:  I have reviewed the data as listed CBC Latest Ref Rng & Units 10/19/2018 10/12/2018 10/05/2018  WBC 4.0 - 10.5 K/uL 2.5(L) 3.2(L) 4.3  Hemoglobin 12.0 - 15.0 g/dL 8.5(L) 8.8(L) 9.4(L)  Hematocrit 36.0 - 46.0 % 26.9(L) 27.9(L) 29.8(L)  Platelets 150 - 400 K/uL 222 223 239     CMP Latest Ref Rng & Units 10/19/2018 10/12/2018 10/05/2018  Glucose 70 - 99 mg/dL 169(H) 83 133(H)  BUN 8 - 23 mg/dL 12 14 9   Creatinine 0.44 - 1.00 mg/dL 0.90 0.97 0.82  Sodium 135 - 145 mmol/L 140 142 142  Potassium 3.5 - 5.1 mmol/L 3.6 3.8 3.8  Chloride 98 - 111 mmol/L 105 109 104  CO2 22 - 32 mmol/L 24 26 28   Calcium 8.9 - 10.3 mg/dL 9.8 9.9 10.2  Total Protein 6.5 - 8.1 g/dL 6.4(L) 6.8 6.9  Total Bilirubin 0.3 - 1.2 mg/dL 0.3 0.2(L) 0.4  Alkaline Phos 38 - 126 U/L 66 79 67  AST 15 - 41 U/L 10(L) 12(L) 15  ALT 0 - 44 U/L 9 12 12       RADIOGRAPHIC STUDIES: I have personally reviewed the radiological images as listed and agreed with the findings in the report. No results found.   ASSESSMENT & PLAN:  ONEDIA VARGUS is a 63 y.o. female with history of  1. 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 Treated with lumpectomy and adjuvant chemo. She previously completed 4 cycles of AC, but didn't  tolerate well. I do not think she can tolerate Carboplatin. She is currently on weekly Taxol. Tolerating well with mild intermittent neuropathy. -she will use ice packs in infusion for her neuropathy. -Labs reviewed, CBC showed Hg 8.5 WBC 2.5 RBC 2.82. CMP pending. -She is clinically stable and doing well. She reports intermittent mild tingling in her fingers and big toes. Tingling doesn't interfere with daily activities.  -f/u in 2 weeks with labs   2.Anemia, secondary to chemo -Labs reviewed, CBC showed Hg 8.5. She denies fatigue. -I advised her to take prenatal vitamins. She agrees.  -Will consider blood transfusion if Hg <8.  3. COPD, HTN -f/u with  PCP and continue medications -I advised her to [facility] her BP at home and reduce her salt intake  -stable   4. Low Appetite, Weight loss -Secondary to chemo. Improved on Taxol  -f/u with dietician and continue nutritional supplements  5. Hyperglycemia -No known history of DM. Likely related to steroids. -I previously reduced her premedication steroids to 5 mg. Will monitor BG. -BG 169 today   Plan  -Labs reviewed, good to proceed with chemo taxol today and continue weekly  -f/u in 2 weeks with labs   No problem-specific Assessment & Plan notes found for this encounter.   No orders of the defined types were placed in this encounter.  All questions were answered. The patient knows to call the clinic with any problems, questions or concerns. No barriers to learning was detected. I spent 15 minutes counseling the patient face to face. The total time spent in the appointment was 20 minutes and more than 50% was on counseling and review of test results  I, Noor Dweik am acting as scribe for Dr. Truitt Merle.  I have reviewed the above documentation for accuracy and completeness, and I agree with the above.     Truitt Merle, MD 10/19/2018

## 2018-10-19 ENCOUNTER — Inpatient Hospital Stay: Payer: Medicaid Other

## 2018-10-19 ENCOUNTER — Encounter: Payer: Self-pay | Admitting: Hematology

## 2018-10-19 ENCOUNTER — Ambulatory Visit: Payer: Medicaid Other

## 2018-10-19 ENCOUNTER — Inpatient Hospital Stay (HOSPITAL_BASED_OUTPATIENT_CLINIC_OR_DEPARTMENT_OTHER): Payer: Medicaid Other | Admitting: Hematology

## 2018-10-19 VITALS — BP 141/98 | HR 96 | Temp 98.2°F | Resp 18 | Ht 66.0 in | Wt 133.5 lb

## 2018-10-19 DIAGNOSIS — J449 Chronic obstructive pulmonary disease, unspecified: Secondary | ICD-10-CM

## 2018-10-19 DIAGNOSIS — D6481 Anemia due to antineoplastic chemotherapy: Secondary | ICD-10-CM | POA: Diagnosis not present

## 2018-10-19 DIAGNOSIS — Z5111 Encounter for antineoplastic chemotherapy: Secondary | ICD-10-CM | POA: Diagnosis not present

## 2018-10-19 DIAGNOSIS — Z171 Estrogen receptor negative status [ER-]: Secondary | ICD-10-CM | POA: Diagnosis not present

## 2018-10-19 DIAGNOSIS — R634 Abnormal weight loss: Secondary | ICD-10-CM

## 2018-10-19 DIAGNOSIS — R739 Hyperglycemia, unspecified: Secondary | ICD-10-CM

## 2018-10-19 DIAGNOSIS — C50112 Malignant neoplasm of central portion of left female breast: Secondary | ICD-10-CM

## 2018-10-19 DIAGNOSIS — G620 Drug-induced polyneuropathy: Secondary | ICD-10-CM

## 2018-10-19 DIAGNOSIS — I10 Essential (primary) hypertension: Secondary | ICD-10-CM

## 2018-10-19 LAB — CMP (CANCER CENTER ONLY)
ALT: 9 U/L (ref 0–44)
AST: 10 U/L — ABNORMAL LOW (ref 15–41)
Albumin: 3.4 g/dL — ABNORMAL LOW (ref 3.5–5.0)
Alkaline Phosphatase: 66 U/L (ref 38–126)
Anion gap: 11 (ref 5–15)
BUN: 12 mg/dL (ref 8–23)
CO2: 24 mmol/L (ref 22–32)
Calcium: 9.8 mg/dL (ref 8.9–10.3)
Chloride: 105 mmol/L (ref 98–111)
Creatinine: 0.9 mg/dL (ref 0.44–1.00)
GFR, Est AFR Am: >60 mL/min (ref >60)
Glucose, Bld: 169 mg/dL — ABNORMAL HIGH (ref 70–99)
Potassium: 3.6 mmol/L (ref 3.5–5.1)
Sodium: 140 mmol/L (ref 135–145)
Total Bilirubin: 0.3 mg/dL (ref 0.3–1.2)
Total Protein: 6.4 g/dL — ABNORMAL LOW (ref 6.5–8.1)

## 2018-10-19 LAB — CBC WITH DIFFERENTIAL (CANCER CENTER ONLY)
Abs Immature Granulocytes: 0.01 10*3/uL (ref 0.00–0.07)
Basophils Absolute: 0 10*3/uL (ref 0.0–0.1)
Basophils Relative: 1 %
Eosinophils Absolute: 0 10*3/uL (ref 0.0–0.5)
Eosinophils Relative: 2 %
HCT: 26.9 % — ABNORMAL LOW (ref 36.0–46.0)
Hemoglobin: 8.5 g/dL — ABNORMAL LOW (ref 12.0–15.0)
Immature Granulocytes: 0 %
Lymphocytes Relative: 23 %
Lymphs Abs: 0.6 10*3/uL — ABNORMAL LOW (ref 0.7–4.0)
MCH: 30.1 pg (ref 26.0–34.0)
MCHC: 31.6 g/dL (ref 30.0–36.0)
MCV: 95.4 fL (ref 80.0–100.0)
Monocytes Absolute: 0.3 10*3/uL (ref 0.1–1.0)
Monocytes Relative: 11 %
Neutro Abs: 1.6 10*3/uL — ABNORMAL LOW (ref 1.7–7.7)
Neutrophils Relative %: 63 %
Platelet Count: 222 10*3/uL (ref 150–400)
RBC: 2.82 MIL/uL — ABNORMAL LOW (ref 3.87–5.11)
RDW: 19.5 % — AB (ref 11.5–15.5)
WBC Count: 2.5 10*3/uL — ABNORMAL LOW (ref 4.0–10.5)
nRBC: 0 % (ref 0.0–0.2)

## 2018-10-19 MED ORDER — FAMOTIDINE IN NACL 20-0.9 MG/50ML-% IV SOLN
INTRAVENOUS | Status: AC
  Filled 2018-10-19: qty 50

## 2018-10-19 MED ORDER — DIPHENHYDRAMINE HCL 50 MG/ML IJ SOLN
50.0000 mg | Freq: Once | INTRAMUSCULAR | Status: AC
  Administered 2018-10-19: 50 mg via INTRAVENOUS

## 2018-10-19 MED ORDER — SODIUM CHLORIDE 0.9 % IV SOLN
80.0000 mg/m2 | Freq: Once | INTRAVENOUS | Status: AC
  Administered 2018-10-19: 138 mg via INTRAVENOUS
  Filled 2018-10-19: qty 23

## 2018-10-19 MED ORDER — DIPHENHYDRAMINE HCL 50 MG/ML IJ SOLN
INTRAMUSCULAR | Status: AC
  Filled 2018-10-19: qty 1

## 2018-10-19 MED ORDER — SODIUM CHLORIDE 0.9 % IV SOLN
Freq: Once | INTRAVENOUS | Status: AC
  Administered 2018-10-19: 09:00:00 via INTRAVENOUS
  Filled 2018-10-19: qty 250

## 2018-10-19 MED ORDER — FAMOTIDINE IN NACL 20-0.9 MG/50ML-% IV SOLN
20.0000 mg | Freq: Once | INTRAVENOUS | Status: AC
  Administered 2018-10-19: 20 mg via INTRAVENOUS

## 2018-10-19 MED ORDER — DEXAMETHASONE SODIUM PHOSPHATE 10 MG/ML IJ SOLN
5.0000 mg | Freq: Once | INTRAMUSCULAR | Status: AC
  Administered 2018-10-19: 5 mg via INTRAVENOUS

## 2018-10-19 MED ORDER — HEPARIN SOD (PORK) LOCK FLUSH 100 UNIT/ML IV SOLN
500.0000 [IU] | Freq: Once | INTRAVENOUS | Status: AC | PRN
  Administered 2018-10-19: 500 [IU]
  Filled 2018-10-19: qty 5

## 2018-10-19 MED ORDER — SODIUM CHLORIDE 0.9% FLUSH
10.0000 mL | INTRAVENOUS | Status: DC | PRN
  Administered 2018-10-19: 10 mL
  Filled 2018-10-19: qty 10

## 2018-10-19 MED ORDER — DEXAMETHASONE SODIUM PHOSPHATE 10 MG/ML IJ SOLN
INTRAMUSCULAR | Status: AC
  Filled 2018-10-19: qty 1

## 2018-10-19 NOTE — Patient Instructions (Signed)
[facility] Discharge Instructions for Patients Receiving Chemotherapy  Today you received the following chemotherapy agents Paclitaxel (TAXOL).  To help prevent nausea and vomiting after your treatment, we encourage you to take your nausea medication as prescribed.  If you develop nausea and vomiting that is not controlled by your nausea medication, call the clinic.   BELOW ARE SYMPTOMS THAT SHOULD BE REPORTED IMMEDIATELY:  *FEVER GREATER THAN 100.5 F  *CHILLS WITH OR WITHOUT FEVER  NAUSEA AND VOMITING THAT IS NOT CONTROLLED WITH YOUR NAUSEA MEDICATION  *UNUSUAL SHORTNESS OF BREATH  *UNUSUAL BRUISING OR BLEEDING  TENDERNESS IN MOUTH AND THROAT WITH OR WITHOUT PRESENCE OF ULCERS  *URINARY PROBLEMS  *BOWEL PROBLEMS  UNUSUAL RASH Items with * indicate a potential emergency and should be followed up as soon as possible.  Feel free to call the clinic should you have any questions or concerns. The clinic phone number is [phone].  Please show the CHEMO ALERT CARD at check-in to the Emergency Department and triage nurse.   

## 2018-10-20 ENCOUNTER — Telehealth: Payer: Self-pay | Admitting: Hematology

## 2018-10-20 NOTE — Telephone Encounter (Signed)
Scheduled appt per 01/30 los.  Spoke with patient daughter about scheduled appts, the daughter stated they will stop by scheduling and get a print out oh her calendar when they come 02/06.

## 2018-10-24 ENCOUNTER — Other Ambulatory Visit: Payer: Self-pay | Admitting: Hematology

## 2018-10-26 ENCOUNTER — Inpatient Hospital Stay: Payer: Medicaid Other

## 2018-10-26 ENCOUNTER — Inpatient Hospital Stay: Payer: Medicaid Other | Attending: Nurse Practitioner

## 2018-10-26 VITALS — BP 173/90 | HR 89 | Temp 98.0°F | Resp 18

## 2018-10-26 DIAGNOSIS — Z171 Estrogen receptor negative status [ER-]: Secondary | ICD-10-CM | POA: Insufficient documentation

## 2018-10-26 DIAGNOSIS — C50112 Malignant neoplasm of central portion of left female breast: Secondary | ICD-10-CM | POA: Diagnosis present

## 2018-10-26 DIAGNOSIS — J449 Chronic obstructive pulmonary disease, unspecified: Secondary | ICD-10-CM | POA: Diagnosis not present

## 2018-10-26 DIAGNOSIS — Z95828 Presence of other vascular implants and grafts: Secondary | ICD-10-CM

## 2018-10-26 DIAGNOSIS — R634 Abnormal weight loss: Secondary | ICD-10-CM | POA: Insufficient documentation

## 2018-10-26 DIAGNOSIS — R739 Hyperglycemia, unspecified: Secondary | ICD-10-CM | POA: Diagnosis not present

## 2018-10-26 DIAGNOSIS — D6481 Anemia due to antineoplastic chemotherapy: Secondary | ICD-10-CM | POA: Diagnosis not present

## 2018-10-26 DIAGNOSIS — Z5111 Encounter for antineoplastic chemotherapy: Secondary | ICD-10-CM | POA: Diagnosis present

## 2018-10-26 DIAGNOSIS — I10 Essential (primary) hypertension: Secondary | ICD-10-CM | POA: Diagnosis not present

## 2018-10-26 LAB — CBC WITH DIFFERENTIAL (CANCER CENTER ONLY)
Abs Immature Granulocytes: 0.01 10*3/uL (ref 0.00–0.07)
Basophils Absolute: 0 10*3/uL (ref 0.0–0.1)
Basophils Relative: 1 %
Eosinophils Absolute: 0 10*3/uL (ref 0.0–0.5)
Eosinophils Relative: 1 %
HCT: 27.9 % — ABNORMAL LOW (ref 36.0–46.0)
Hemoglobin: 8.9 g/dL — ABNORMAL LOW (ref 12.0–15.0)
Immature Granulocytes: 0 %
Lymphocytes Relative: 20 %
Lymphs Abs: 0.6 10*3/uL — ABNORMAL LOW (ref 0.7–4.0)
MCH: 30.2 pg (ref 26.0–34.0)
MCHC: 31.9 g/dL (ref 30.0–36.0)
MCV: 94.6 fL (ref 80.0–100.0)
Monocytes Absolute: 0.3 10*3/uL (ref 0.1–1.0)
Monocytes Relative: 10 %
Neutro Abs: 2 10*3/uL (ref 1.7–7.7)
Neutrophils Relative %: 68 %
Platelet Count: 236 10*3/uL (ref 150–400)
RBC: 2.95 MIL/uL — ABNORMAL LOW (ref 3.87–5.11)
RDW: 19.4 % — ABNORMAL HIGH (ref 11.5–15.5)
WBC Count: 3 10*3/uL — ABNORMAL LOW (ref 4.0–10.5)
nRBC: 0 % (ref 0.0–0.2)

## 2018-10-26 LAB — CMP (CANCER CENTER ONLY)
ALT: 13 U/L (ref 0–44)
AST: 14 U/L — ABNORMAL LOW (ref 15–41)
Albumin: 3.5 g/dL (ref 3.5–5.0)
Alkaline Phosphatase: 62 U/L (ref 38–126)
Anion gap: 10 (ref 5–15)
BUN: 15 mg/dL (ref 8–23)
CO2: 27 mmol/L (ref 22–32)
Calcium: 10.1 mg/dL (ref 8.9–10.3)
Chloride: 104 mmol/L (ref 98–111)
Creatinine: 0.91 mg/dL (ref 0.44–1.00)
GFR, Est AFR Am: >60 mL/min (ref >60)
GFR, Estimated: >60 mL/min (ref >60)
Glucose, Bld: 156 mg/dL — ABNORMAL HIGH (ref 70–99)
Potassium: 3.6 mmol/L (ref 3.5–5.1)
Sodium: 141 mmol/L (ref 135–145)
Total Bilirubin: 0.3 mg/dL (ref 0.3–1.2)
Total Protein: 6.7 g/dL (ref 6.5–8.1)

## 2018-10-26 MED ORDER — HEPARIN SOD (PORK) LOCK FLUSH 100 UNIT/ML IV SOLN
500.0000 [IU] | Freq: Once | INTRAVENOUS | Status: AC | PRN
  Administered 2018-10-26: 500 [IU]
  Filled 2018-10-26: qty 5

## 2018-10-26 MED ORDER — DEXAMETHASONE SODIUM PHOSPHATE 10 MG/ML IJ SOLN
5.0000 mg | Freq: Once | INTRAMUSCULAR | Status: AC
  Administered 2018-10-26: 5 mg via INTRAVENOUS

## 2018-10-26 MED ORDER — DIPHENHYDRAMINE HCL 50 MG/ML IJ SOLN
INTRAMUSCULAR | Status: AC
  Filled 2018-10-26: qty 1

## 2018-10-26 MED ORDER — SODIUM CHLORIDE 0.9% FLUSH
10.0000 mL | INTRAVENOUS | Status: DC | PRN
  Administered 2018-10-26: 10 mL
  Filled 2018-10-26: qty 10

## 2018-10-26 MED ORDER — FAMOTIDINE IN NACL 20-0.9 MG/50ML-% IV SOLN
INTRAVENOUS | Status: AC
  Filled 2018-10-26: qty 50

## 2018-10-26 MED ORDER — DIPHENHYDRAMINE HCL 50 MG/ML IJ SOLN
50.0000 mg | Freq: Once | INTRAMUSCULAR | Status: AC
  Administered 2018-10-26: 50 mg via INTRAVENOUS

## 2018-10-26 MED ORDER — DEXAMETHASONE SODIUM PHOSPHATE 10 MG/ML IJ SOLN
INTRAMUSCULAR | Status: AC
  Filled 2018-10-26: qty 1

## 2018-10-26 MED ORDER — SODIUM CHLORIDE 0.9 % IV SOLN
Freq: Once | INTRAVENOUS | Status: AC
  Administered 2018-10-26: 09:00:00 via INTRAVENOUS
  Filled 2018-10-26: qty 250

## 2018-10-26 MED ORDER — SODIUM CHLORIDE 0.9 % IV SOLN
80.0000 mg/m2 | Freq: Once | INTRAVENOUS | Status: AC
  Administered 2018-10-26: 138 mg via INTRAVENOUS
  Filled 2018-10-26: qty 23

## 2018-10-26 MED ORDER — FAMOTIDINE IN NACL 20-0.9 MG/50ML-% IV SOLN
20.0000 mg | Freq: Once | INTRAVENOUS | Status: AC
  Administered 2018-10-26: 20 mg via INTRAVENOUS

## 2018-10-26 NOTE — Patient Instructions (Signed)
[facility] Discharge Instructions for Patients Receiving Chemotherapy  Today you received the following chemotherapy agents Paclitaxel (TAXOL).  To help prevent nausea and vomiting after your treatment, we encourage you to take your nausea medication as prescribed.  If you develop nausea and vomiting that is not controlled by your nausea medication, call the clinic.   BELOW ARE SYMPTOMS THAT SHOULD BE REPORTED IMMEDIATELY:  *FEVER GREATER THAN 100.5 F  *CHILLS WITH OR WITHOUT FEVER  NAUSEA AND VOMITING THAT IS NOT CONTROLLED WITH YOUR NAUSEA MEDICATION  *UNUSUAL SHORTNESS OF BREATH  *UNUSUAL BRUISING OR BLEEDING  TENDERNESS IN MOUTH AND THROAT WITH OR WITHOUT PRESENCE OF ULCERS  *URINARY PROBLEMS  *BOWEL PROBLEMS  UNUSUAL RASH Items with * indicate a potential emergency and should be followed up as soon as possible.  Feel free to call the clinic should you have any questions or concerns. The clinic phone number is [phone].  Please show the CHEMO ALERT CARD at check-in to the Emergency Department and triage nurse.   

## 2018-10-30 NOTE — Progress Notes (Signed)
[place]   [phone] [phone]   Clinic Follow up Note   Patient Care Team: [facility] as PCP - General Coralie Keens, MD as Consulting Physician (General Surgery) Truitt Merle, MD as Consulting Physician (Hematology) 11/02/2018   CHIEF COMPLAINT: F/u on left breast cancer  SUMMARY OF ONCOLOGIC HISTORY:   Cancer of central portion of left female breast ([place])   04/28/2018 Cancer Staging    Staging form: Breast, AJCC 8th Edition - Clinical stage from 04/28/2018: Stage IB (cT1c, cN0, cM0, G3, ER-, PR-, HER2-) - Signed by Truitt Merle, MD on 05/24/2018    05/24/2018 Initial Diagnosis    Cancer of central portion of left female breast ([place])    06/02/2018 Cancer Staging    Staging form: Breast, AJCC 8th Edition - Pathologic stage from 06/02/2018: Stage IIA (pT2, pN0, cM0, G3, ER-, PR-, HER2-) - Signed by Truitt Merle, MD on 06/23/2018    06/07/2018 Surgery    LEFT BREAST PARTIAL MASTECTOMY WITH AXILLARY SENTINEL LYMPH NODE BIOPSY and PAC placement by Dr. Ninfa Linden  06/07/18    06/07/2018 Pathology Results    Diagnosis 06/07/18 1. Breast, partial mastectomy, Left - INVASIVE DUCTAL CARCINOMA, GRADE III, 2.1 CM 1 of 4 FINAL for DELAILAH, SPIETH J [phone]) Diagnosis(continued) - SURGICAL RESECTION MARGINS ARE NEGATIVE FOR CARCINOMA. - NEGATIVE FOR LYMPHOVASCULAR OR PERINEURAL INVASION. - BIOPSY SITE CHANGES. - SEE ONCOLOGY TABLE. - SEE NOTE. 2. Lymph node, sentinel, biopsy, Left Axillary - LYMPH NODE, NEGATIVE FOR CARCINOMA (0/1). 3. Lymph node, sentinel, biopsy, Left - LYMPH NODE, NEGATIVE FOR CARCINOMA (0/1). 4. Lymph node, sentinel, biopsy, Left - LYMPH NODE, NEGATIVE FOR CARCINOMA (0/1). 5. Lymph node, sentinel, biopsy, Left - LYMPH NODE, NEGATIVE FOR CARCINOMA (0/1). 6. Lymph node, sentinel, biopsy, Left - LYMPH NODE, NEGATIVE FOR CARCINOMA (0/1). 7. Lymph node, sentinel, biopsy, Left - LYMPH NODE, NEGATIVE FOR  CARCINOMA (0/1). The tumor cells are Negative for Her2 (0). Estrogen Receptor: 0%, NEGATIVE Progesterone Receptor: 0%, NEGATIVE Proliferation Marker Ki67: 40%    06/08/2018 Breast MRI    MRI Breast B/l 05/29/18 IMPRESSION: Solitary mass within the UPPER central portion of the LEFT breast, 2.3 Centimeters in maximum diameter. No MRI evidence for adenopathy. RIGHT breast is negative. LEFT BREAST PARTIAL MASTECTOMY WITH AXILLARY SENTINEL LYMPH NODE BIOPSY with PAC placement by Dr. Ninfa Linden 06/07/18    07/11/2018 Imaging    Whole Body Bone Scan 07/11/18  IMPRESSION: No scintigraphic evidence of osseous metastatic disease.    07/13/2018 -  Chemotherapy    Adjuvant AC every 2 weeks for 4 cycles, 07/13/18-08/24/18. Followed by Taxol every 2 weeks starting 09/11/18    08/20/2018 Imaging    CT AP W Contrast 08/20/18  IMPRESSION: No acute findings in the abdomen/pelvis. Mild prominence of the main pancreatic duct measuring 6 mm. No evidence of mass, adenopathy or ductal stones. Subtle focal nodularity over the posterior left lower lobe which may be acute or chronic and may be due to an infectious versus atypical infectious or inflammatory process. Recommend follow-up chest CT 4-6 weeks. Two subcentimeter liver hypodensities too small to characterize but likely cysts. Aortic Atherosclerosis (ICD10-I70.0).     CURRENT THERAPY  Taxolweeklystartingweek of 09/11/18  INTERVAL HISTORY: Jennifer Johns is a 63 y.o. female who is here for follow-up. Today, she is here alone and is doing well. She describes numbness on her big toe. Denies having trouble picking up small things. She does not take nausea medication and her energy levels  are good. Denies and cp or stomach pain.    Pertinent positives and negatives of review of systems are listed and detailed within the above HPI.  REVIEW OF SYSTEMS:  Constitutional: Denies fevers, chills or abnormal weight loss Eyes: Denies blurriness of  vision Ears, nose, mouth, throat, and face: Denies mucositis or sore throat Respiratory: Denies cough, dyspnea or wheezes Cardiovascular: Denies palpitation, chest discomfort or lower extremity swelling Gastrointestinal:  Denies nausea, heartburn or change in bowel habits Skin: Denies abnormal skin rashes Lymphatics: Denies new lymphadenopathy or easy bruising Neurological:Denies new weaknesses, (+) numbness on her right toe  Behavioral/Psych: Mood is stable, no new changes  All other systems were reviewed with the patient and are negative.  MEDICAL HISTORY:  Past Medical History:  Diagnosis Date  . Asthma   . Cancer ([place])    Left Breast  . COPD (chronic obstructive pulmonary disease) ([place])   . Hypertension     SURGICAL HISTORY: Past Surgical History:  Procedure Laterality Date  . COLONOSCOPY N/A 03/18/2016   Procedure: COLONOSCOPY;  Surgeon: Danie Binder, MD;  [facility];  Service: Endoscopy;  Laterality: N/A;  10:30 AM  . PARTIAL MASTECTOMY WITH AXILLARY SENTINEL LYMPH NODE BIOPSY Left 06/07/2018   Procedure: LEFT BREAST PARTIAL MASTECTOMY WITH AXILLARY SENTINEL LYMPH NODE BIOPSY;  Surgeon: Coralie Keens, MD;  Location: [facility];  Service: General;  Laterality: Left;  . PORTACATH PLACEMENT N/A 06/07/2018   Procedure: INSERTION PORT-A-CATH;  Surgeon: Coralie Keens, MD;  Location: [facility];  Service: General;  Laterality: N/A;    I have reviewed the social history and family history with the patient and they are unchanged from previous note.  ALLERGIES:  is allergic to carrot [daucus carota] and other.  MEDICATIONS:  Current Outpatient Medications  Medication Sig Dispense Refill  . albuterol (ACCUNEB) 1.25 MG/3ML nebulizer solution Take 1 ampule by nebulization every 6 (six) hours as needed for wheezing or shortness of breath.     Marland Kitchen albuterol (PROVENTIL HFA;VENTOLIN HFA) 108 (90 Base) MCG/ACT inhaler Inhale 2 puffs into the lungs every 6 (six) hours as needed for  wheezing or shortness of breath.     . cholecalciferol (VITAMIN D) 1000 units tablet Take 1,000 Units by mouth daily.   1  . lidocaine-prilocaine (EMLA) cream Apply to affected area once 30 g 3  . lisinopril (PRINIVIL,ZESTRIL) 10 MG tablet Take 10 mg by mouth daily.    Marland Kitchen loperamide (IMODIUM) 2 MG capsule Take 2 tabs with first watery stool then 1 tab after each subsequent watery stool. Do not exceed 8 tabs per day 40 capsule 1  . ondansetron (ZOFRAN) 8 MG tablet Take 1 tablet (8 mg total) by mouth 2 (two) times daily as needed. Start on the third day after chemotherapy. 30 tablet 1  . prochlorperazine (COMPAZINE) 10 MG tablet Take 1 tablet (10 mg total) by mouth every 6 (six) hours as needed (Nausea or vomiting). 30 tablet 1  . ranitidine (ZANTAC) 150 MG tablet Take 150 mg by mouth daily.   1   No current facility-administered medications for this visit.    Facility-Administered Medications Ordered in Other Visits  Medication Dose Route Frequency Provider Last Rate Last Dose  . sodium chloride flush (NS) 0.9 % injection 10 mL  10 mL Intracatheter PRN Truitt Merle, MD   10 mL at 11/02/18 1138    PHYSICAL EXAMINATION: ECOG PERFORMANCE STATUS: 1 - Symptomatic but completely ambulatory  Vitals:   11/02/18 0824  BP: (!) 154/95  Pulse: 99  Resp: 18  Temp: 98.2 F (36.8 C)  SpO2: 97%   Filed Weights   11/02/18 0824  Weight: 133 lb 14.4 oz (60.7 kg)    GENERAL:alert, no distress and comfortable SKIN: skin color, texture, turgor are normal, no rashes or significant lesions EYES: normal, Conjunctiva are pink and non-injected, sclera clear OROPHARYNX:no exudate, no erythema and lips, buccal mucosa, and tongue normal  NECK: supple, thyroid normal size, non-tender, without nodularity LYMPH:  no palpable lymphadenopathy in the cervical, axillary or inguinal LUNGS: clear to auscultation and percussion with normal breathing effort HEART: regular rate & rhythm and no murmurs and no lower  extremity edema ABDOMEN:abdomen soft, non-tender and normal bowel sounds Musculoskeletal:no cyanosis of digits and no clubbing  NEURO: alert & oriented x 3 with fluent speech, no focal motor/sensory deficits (+) sensation is normal  BREAST: s/p left partial mastectomy, (+) incision has healed well, no palpable mass or adenopathy  LABORATORY DATA:  I have reviewed the data as listed CBC Latest Ref Rng & Units 11/02/2018 10/26/2018 10/19/2018  WBC 4.0 - 10.5 K/uL 3.0(L) 3.0(L) 2.5(L)  Hemoglobin 12.0 - 15.0 g/dL 8.5(L) 8.9(L) 8.5(L)  Hematocrit 36.0 - 46.0 % 27.0(L) 27.9(L) 26.9(L)  Platelets 150 - 400 K/uL 241 236 222     CMP Latest Ref Rng & Units 11/02/2018 10/26/2018 10/19/2018  Glucose 70 - 99 mg/dL 139(H) 156(H) 169(H)  BUN 8 - 23 mg/dL _0 Creatinine 0.44 - 1.00 mg/dL 0.92 0.91 0.90  Sodium 135 - 145 mmol/L 140 141 140  Potassium 3.5 - 5.1 mmol/L 3.6 3.6 3.6  Chloride 98 - 111 mmol/L 106 104 105  CO2 22 - 32 mmol/L _1 Calcium 8.9 - 10.3 mg/dL 9.8 10.1 9.8  Total Protein 6.5 - 8.1 g/dL 6.6 6.7 6.4(L)  Total Bilirubin 0.3 - 1.2 mg/dL 0.4 0.3 0.3  Alkaline Phos 38 - 126 U/L 75 62 66  AST 15 - 41 U/L 11(L) 14(L) 10(L)  ALT 0 - 44 U/L _2 RADIOGRAPHIC STUDIES: I have personally reviewed the radiological images as listed and agreed with the findings in the report. No results found.   ASSESSMENT & PLAN:  Jennifer Johns is a 63 y.o. female with history of   1. 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 Treated with lumpectomy and adjuvant chemo. She previously completed 4 cycles of AC, but didn't tolerate well. I do not think she can tolerate Carboplatin. She is currently on weekly Taxol. Tolerating well with mild intermittent neuropathy. -Labs reviewed, mild leukopenia, moderate anemia stable, CMP is unremarkable, overall adequate for treatment, will proceed with  Taxol and continue weekly. -Up in 2 weeks -She will proceed with adjuvant radiation after chemotherapy, will refer her back to right lung on next visit  2.Anemia, secondary to chemo  - I previously recommended her to take prenatal vitamins -Will consider blood transfusion if Hg <8. -Labs reviewed, hemoglobin 8.5, overall stable.  She is not symptomatic.  3. COPD, HTN -f/u with PCP and continue medications -I previously advised her to [facility] her BP at home and reduce her salt intake  -stable   4. Low Appetite, Weight loss -Secondary to chemo. Improved on Taxol  -Uses nutritional supplements  -f/u with dietician   5. Hyperglycemia -No known history of DM. Likely related to steroids. -I previously reduced her premedication steroids to 5 mg.  Will monitor BG. -BG 135 today     Plan  -Labs reviewed, good to proceed with chemo taxol today and continue weekly for 4 more treatments  -will refer her back to rad/onc after she completes chemo  -f/u in 2 weeks   No problem-specific Assessment & Plan notes found for this encounter.   No orders of the defined types were placed in this encounter.  All questions were answered. The patient knows to call the clinic with any problems, questions or concerns. No barriers to learning was detected. I spent 15 minutes counseling the patient face to face. The total time spent in the appointment was 20 minutes and more than 50% was on counseling and review of test results  I, Manson Allan am acting as scribe for Dr. Truitt Merle.  I have reviewed the above documentation for accuracy and completeness, and I agree with the above.     Truitt Merle, MD 11/02/2018

## 2018-11-02 ENCOUNTER — Inpatient Hospital Stay: Payer: Medicaid Other

## 2018-11-02 ENCOUNTER — Encounter: Payer: Self-pay | Admitting: Hematology

## 2018-11-02 ENCOUNTER — Inpatient Hospital Stay (HOSPITAL_BASED_OUTPATIENT_CLINIC_OR_DEPARTMENT_OTHER): Payer: Medicaid Other | Admitting: Hematology

## 2018-11-02 VITALS — BP 154/95 | HR 99 | Temp 98.2°F | Resp 18 | Ht 66.0 in | Wt 133.9 lb

## 2018-11-02 VITALS — BP 144/87

## 2018-11-02 DIAGNOSIS — J449 Chronic obstructive pulmonary disease, unspecified: Secondary | ICD-10-CM

## 2018-11-02 DIAGNOSIS — D6481 Anemia due to antineoplastic chemotherapy: Secondary | ICD-10-CM | POA: Diagnosis not present

## 2018-11-02 DIAGNOSIS — Z171 Estrogen receptor negative status [ER-]: Principal | ICD-10-CM

## 2018-11-02 DIAGNOSIS — Z5111 Encounter for antineoplastic chemotherapy: Secondary | ICD-10-CM | POA: Diagnosis not present

## 2018-11-02 DIAGNOSIS — R634 Abnormal weight loss: Secondary | ICD-10-CM

## 2018-11-02 DIAGNOSIS — I10 Essential (primary) hypertension: Secondary | ICD-10-CM

## 2018-11-02 DIAGNOSIS — C50112 Malignant neoplasm of central portion of left female breast: Secondary | ICD-10-CM

## 2018-11-02 DIAGNOSIS — Z95828 Presence of other vascular implants and grafts: Secondary | ICD-10-CM

## 2018-11-02 DIAGNOSIS — R739 Hyperglycemia, unspecified: Secondary | ICD-10-CM

## 2018-11-02 LAB — CBC WITH DIFFERENTIAL (CANCER CENTER ONLY)
Abs Immature Granulocytes: 0.02 10*3/uL (ref 0.00–0.07)
Basophils Absolute: 0 10*3/uL (ref 0.0–0.1)
Basophils Relative: 1 %
Eosinophils Absolute: 0.1 10*3/uL (ref 0.0–0.5)
Eosinophils Relative: 2 %
HCT: 27 % — ABNORMAL LOW (ref 36.0–46.0)
Hemoglobin: 8.5 g/dL — ABNORMAL LOW (ref 12.0–15.0)
Immature Granulocytes: 1 %
Lymphocytes Relative: 25 %
Lymphs Abs: 0.8 10*3/uL (ref 0.7–4.0)
MCH: 31.1 pg (ref 26.0–34.0)
MCHC: 31.5 g/dL (ref 30.0–36.0)
MCV: 98.9 fL (ref 80.0–100.0)
Monocytes Absolute: 0.3 10*3/uL (ref 0.1–1.0)
Monocytes Relative: 11 %
Neutro Abs: 1.8 10*3/uL (ref 1.7–7.7)
Neutrophils Relative %: 60 %
Platelet Count: 241 10*3/uL (ref 150–400)
RBC: 2.73 MIL/uL — ABNORMAL LOW (ref 3.87–5.11)
RDW: 19.2 % — ABNORMAL HIGH (ref 11.5–15.5)
WBC Count: 3 10*3/uL — ABNORMAL LOW (ref 4.0–10.5)
nRBC: 0 % (ref 0.0–0.2)

## 2018-11-02 LAB — CMP (CANCER CENTER ONLY)
ALT: 9 U/L (ref 0–44)
AST: 11 U/L — ABNORMAL LOW (ref 15–41)
Albumin: 3.5 g/dL (ref 3.5–5.0)
Alkaline Phosphatase: 75 U/L (ref 38–126)
Anion gap: 8 (ref 5–15)
BUN: 12 mg/dL (ref 8–23)
CO2: 26 mmol/L (ref 22–32)
Calcium: 9.8 mg/dL (ref 8.9–10.3)
Chloride: 106 mmol/L (ref 98–111)
Creatinine: 0.92 mg/dL (ref 0.44–1.00)
GFR, Est AFR Am: >60 mL/min (ref >60)
GFR, Estimated: >60 mL/min (ref >60)
Glucose, Bld: 139 mg/dL — ABNORMAL HIGH (ref 70–99)
Potassium: 3.6 mmol/L (ref 3.5–5.1)
Sodium: 140 mmol/L (ref 135–145)
Total Bilirubin: 0.4 mg/dL (ref 0.3–1.2)
Total Protein: 6.6 g/dL (ref 6.5–8.1)

## 2018-11-02 MED ORDER — SODIUM CHLORIDE 0.9 % IV SOLN
Freq: Once | INTRAVENOUS | Status: AC
  Administered 2018-11-02: 09:00:00 via INTRAVENOUS
  Filled 2018-11-02: qty 250

## 2018-11-02 MED ORDER — FAMOTIDINE IN NACL 20-0.9 MG/50ML-% IV SOLN
INTRAVENOUS | Status: AC
  Filled 2018-11-02: qty 50

## 2018-11-02 MED ORDER — DIPHENHYDRAMINE HCL 50 MG/ML IJ SOLN
50.0000 mg | Freq: Once | INTRAMUSCULAR | Status: AC
  Administered 2018-11-02: 50 mg via INTRAVENOUS

## 2018-11-02 MED ORDER — DEXAMETHASONE SODIUM PHOSPHATE 10 MG/ML IJ SOLN
INTRAMUSCULAR | Status: AC
  Filled 2018-11-02: qty 1

## 2018-11-02 MED ORDER — HEPARIN SOD (PORK) LOCK FLUSH 100 UNIT/ML IV SOLN
500.0000 [IU] | Freq: Once | INTRAVENOUS | Status: AC | PRN
  Administered 2018-11-02: 500 [IU]
  Filled 2018-11-02: qty 5

## 2018-11-02 MED ORDER — FAMOTIDINE IN NACL 20-0.9 MG/50ML-% IV SOLN
20.0000 mg | Freq: Once | INTRAVENOUS | Status: AC
  Administered 2018-11-02: 20 mg via INTRAVENOUS

## 2018-11-02 MED ORDER — DEXAMETHASONE SODIUM PHOSPHATE 10 MG/ML IJ SOLN
5.0000 mg | Freq: Once | INTRAMUSCULAR | Status: AC
  Administered 2018-11-02: 5 mg via INTRAVENOUS

## 2018-11-02 MED ORDER — SODIUM CHLORIDE 0.9% FLUSH
10.0000 mL | INTRAVENOUS | Status: DC | PRN
  Administered 2018-11-02: 10 mL
  Filled 2018-11-02: qty 10

## 2018-11-02 MED ORDER — DIPHENHYDRAMINE HCL 50 MG/ML IJ SOLN
INTRAMUSCULAR | Status: AC
  Filled 2018-11-02: qty 1

## 2018-11-02 MED ORDER — SODIUM CHLORIDE 0.9 % IV SOLN
80.0000 mg/m2 | Freq: Once | INTRAVENOUS | Status: AC
  Administered 2018-11-02: 138 mg via INTRAVENOUS
  Filled 2018-11-02: qty 23

## 2018-11-02 NOTE — Patient Instructions (Signed)
[facility] Discharge Instructions for Patients Receiving Chemotherapy  Today you received the following chemotherapy agents Paclitaxel (TAXOL).  To help prevent nausea and vomiting after your treatment, we encourage you to take your nausea medication as prescribed.  If you develop nausea and vomiting that is not controlled by your nausea medication, call the clinic.   BELOW ARE SYMPTOMS THAT SHOULD BE REPORTED IMMEDIATELY:  *FEVER GREATER THAN 100.5 F  *CHILLS WITH OR WITHOUT FEVER  NAUSEA AND VOMITING THAT IS NOT CONTROLLED WITH YOUR NAUSEA MEDICATION  *UNUSUAL SHORTNESS OF BREATH  *UNUSUAL BRUISING OR BLEEDING  TENDERNESS IN MOUTH AND THROAT WITH OR WITHOUT PRESENCE OF ULCERS  *URINARY PROBLEMS  *BOWEL PROBLEMS  UNUSUAL RASH Items with * indicate a potential emergency and should be followed up as soon as possible.  Feel free to call the clinic should you have any questions or concerns. The clinic phone number is [phone].  Please show the CHEMO ALERT CARD at check-in to the Emergency Department and triage nurse.   

## 2018-11-03 ENCOUNTER — Telehealth: Payer: Self-pay | Admitting: Hematology

## 2018-11-03 NOTE — Telephone Encounter (Signed)
No los per 2/13.

## 2018-11-09 ENCOUNTER — Inpatient Hospital Stay: Payer: Medicaid Other

## 2018-11-09 VITALS — BP 166/95 | HR 99 | Temp 98.6°F | Resp 18

## 2018-11-09 DIAGNOSIS — Z95828 Presence of other vascular implants and grafts: Secondary | ICD-10-CM

## 2018-11-09 DIAGNOSIS — C50112 Malignant neoplasm of central portion of left female breast: Secondary | ICD-10-CM

## 2018-11-09 DIAGNOSIS — Z171 Estrogen receptor negative status [ER-]: Principal | ICD-10-CM

## 2018-11-09 DIAGNOSIS — Z5111 Encounter for antineoplastic chemotherapy: Secondary | ICD-10-CM | POA: Diagnosis not present

## 2018-11-09 LAB — CBC WITH DIFFERENTIAL (CANCER CENTER ONLY)
Abs Immature Granulocytes: 0.01 10*3/uL (ref 0.00–0.07)
Basophils Absolute: 0 10*3/uL (ref 0.0–0.1)
Basophils Relative: 1 %
Eosinophils Absolute: 0 10*3/uL (ref 0.0–0.5)
Eosinophils Relative: 1 %
HCT: 27.3 % — ABNORMAL LOW (ref 36.0–46.0)
Hemoglobin: 8.5 g/dL — ABNORMAL LOW (ref 12.0–15.0)
Immature Granulocytes: 0 %
LYMPHS PCT: 25 %
Lymphs Abs: 0.7 10*3/uL (ref 0.7–4.0)
MCH: 30.6 pg (ref 26.0–34.0)
MCHC: 31.1 g/dL (ref 30.0–36.0)
MCV: 98.2 fL (ref 80.0–100.0)
Monocytes Absolute: 0.3 10*3/uL (ref 0.1–1.0)
Monocytes Relative: 9 %
NEUTROS PCT: 64 %
Neutro Abs: 1.8 10*3/uL (ref 1.7–7.7)
Platelet Count: 260 10*3/uL (ref 150–400)
RBC: 2.78 MIL/uL — AB (ref 3.87–5.11)
RDW: 18.4 % — ABNORMAL HIGH (ref 11.5–15.5)
WBC Count: 2.9 10*3/uL — ABNORMAL LOW (ref 4.0–10.5)
nRBC: 0 % (ref 0.0–0.2)

## 2018-11-09 LAB — CMP (CANCER CENTER ONLY)
ALT: 9 U/L (ref 0–44)
ANION GAP: 10 (ref 5–15)
AST: 14 U/L — ABNORMAL LOW (ref 15–41)
Albumin: 3.6 g/dL (ref 3.5–5.0)
Alkaline Phosphatase: 75 U/L (ref 38–126)
BUN: 10 mg/dL (ref 8–23)
CO2: 25 mmol/L (ref 22–32)
Calcium: 9.9 mg/dL (ref 8.9–10.3)
Chloride: 105 mmol/L (ref 98–111)
Creatinine: 0.88 mg/dL (ref 0.44–1.00)
GFR, Est AFR Am: >60 mL/min (ref >60)
GFR, Estimated: >60 mL/min (ref >60)
Glucose, Bld: 147 mg/dL — ABNORMAL HIGH (ref 70–99)
Potassium: 3.6 mmol/L (ref 3.5–5.1)
Sodium: 140 mmol/L (ref 135–145)
Total Bilirubin: 0.3 mg/dL (ref 0.3–1.2)
Total Protein: 6.8 g/dL (ref 6.5–8.1)

## 2018-11-09 MED ORDER — HEPARIN SOD (PORK) LOCK FLUSH 100 UNIT/ML IV SOLN
500.0000 [IU] | Freq: Once | INTRAVENOUS | Status: AC | PRN
  Administered 2018-11-09: 500 [IU]
  Filled 2018-11-09: qty 5

## 2018-11-09 MED ORDER — SODIUM CHLORIDE 0.9 % IV SOLN
Freq: Once | INTRAVENOUS | Status: AC
  Administered 2018-11-09: 09:00:00 via INTRAVENOUS
  Filled 2018-11-09: qty 250

## 2018-11-09 MED ORDER — DIPHENHYDRAMINE HCL 50 MG/ML IJ SOLN
INTRAMUSCULAR | Status: AC
  Filled 2018-11-09: qty 1

## 2018-11-09 MED ORDER — FAMOTIDINE IN NACL 20-0.9 MG/50ML-% IV SOLN
INTRAVENOUS | Status: AC
  Filled 2018-11-09: qty 50

## 2018-11-09 MED ORDER — DIPHENHYDRAMINE HCL 50 MG/ML IJ SOLN
50.0000 mg | Freq: Once | INTRAMUSCULAR | Status: AC
  Administered 2018-11-09: 50 mg via INTRAVENOUS

## 2018-11-09 MED ORDER — SODIUM CHLORIDE 0.9 % IV SOLN
80.0000 mg/m2 | Freq: Once | INTRAVENOUS | Status: AC
  Administered 2018-11-09: 138 mg via INTRAVENOUS
  Filled 2018-11-09: qty 23

## 2018-11-09 MED ORDER — SODIUM CHLORIDE 0.9% FLUSH
10.0000 mL | INTRAVENOUS | Status: DC | PRN
  Administered 2018-11-09: 10 mL
  Filled 2018-11-09: qty 10

## 2018-11-09 MED ORDER — FAMOTIDINE IN NACL 20-0.9 MG/50ML-% IV SOLN
20.0000 mg | Freq: Once | INTRAVENOUS | Status: AC
  Administered 2018-11-09: 20 mg via INTRAVENOUS

## 2018-11-09 MED ORDER — DEXAMETHASONE SODIUM PHOSPHATE 10 MG/ML IJ SOLN
5.0000 mg | Freq: Once | INTRAMUSCULAR | Status: AC
  Administered 2018-11-09: 5 mg via INTRAVENOUS

## 2018-11-09 MED ORDER — DEXAMETHASONE SODIUM PHOSPHATE 10 MG/ML IJ SOLN
INTRAMUSCULAR | Status: AC
  Filled 2018-11-09: qty 1

## 2018-11-09 NOTE — Patient Instructions (Signed)
[facility] Discharge Instructions for Patients Receiving Chemotherapy  Today you received the following chemotherapy agents Paclitaxel (TAXOL).  To help prevent nausea and vomiting after your treatment, we encourage you to take your nausea medication as prescribed.  If you develop nausea and vomiting that is not controlled by your nausea medication, call the clinic.   BELOW ARE SYMPTOMS THAT SHOULD BE REPORTED IMMEDIATELY:  *FEVER GREATER THAN 100.5 F  *CHILLS WITH OR WITHOUT FEVER  NAUSEA AND VOMITING THAT IS NOT CONTROLLED WITH YOUR NAUSEA MEDICATION  *UNUSUAL SHORTNESS OF BREATH  *UNUSUAL BRUISING OR BLEEDING  TENDERNESS IN MOUTH AND THROAT WITH OR WITHOUT PRESENCE OF ULCERS  *URINARY PROBLEMS  *BOWEL PROBLEMS  UNUSUAL RASH Items with * indicate a potential emergency and should be followed up as soon as possible.  Feel free to call the clinic should you have any questions or concerns. The clinic phone number is [phone].  Please show the CHEMO ALERT CARD at check-in to the Emergency Department and triage nurse.   

## 2018-11-16 ENCOUNTER — Inpatient Hospital Stay: Payer: Medicaid Other

## 2018-11-16 ENCOUNTER — Inpatient Hospital Stay (HOSPITAL_BASED_OUTPATIENT_CLINIC_OR_DEPARTMENT_OTHER): Payer: Medicaid Other | Admitting: Medical

## 2018-11-16 VITALS — HR 97

## 2018-11-16 VITALS — BP 143/78 | HR 101 | Temp 98.2°F | Resp 18 | Ht 66.0 in | Wt 132.8 lb

## 2018-11-16 DIAGNOSIS — C50112 Malignant neoplasm of central portion of left female breast: Secondary | ICD-10-CM

## 2018-11-16 DIAGNOSIS — D6481 Anemia due to antineoplastic chemotherapy: Secondary | ICD-10-CM | POA: Diagnosis not present

## 2018-11-16 DIAGNOSIS — Z171 Estrogen receptor negative status [ER-]: Secondary | ICD-10-CM | POA: Diagnosis not present

## 2018-11-16 DIAGNOSIS — G620 Drug-induced polyneuropathy: Secondary | ICD-10-CM | POA: Diagnosis not present

## 2018-11-16 DIAGNOSIS — T451X5A Adverse effect of antineoplastic and immunosuppressive drugs, initial encounter: Secondary | ICD-10-CM

## 2018-11-16 DIAGNOSIS — Z95828 Presence of other vascular implants and grafts: Secondary | ICD-10-CM

## 2018-11-16 DIAGNOSIS — Z5111 Encounter for antineoplastic chemotherapy: Secondary | ICD-10-CM | POA: Diagnosis not present

## 2018-11-16 DIAGNOSIS — J449 Chronic obstructive pulmonary disease, unspecified: Secondary | ICD-10-CM

## 2018-11-16 LAB — CMP (CANCER CENTER ONLY)
ALT: 12 U/L (ref 0–44)
AST: 13 U/L — ABNORMAL LOW (ref 15–41)
Albumin: 3.7 g/dL (ref 3.5–5.0)
Alkaline Phosphatase: 66 U/L (ref 38–126)
Anion gap: 9 (ref 5–15)
BUN: 18 mg/dL (ref 8–23)
CO2: 26 mmol/L (ref 22–32)
Calcium: 10 mg/dL (ref 8.9–10.3)
Chloride: 104 mmol/L (ref 98–111)
Creatinine: 1 mg/dL (ref 0.44–1.00)
GFR, Est AFR Am: >60 mL/min (ref >60)
GFR, Estimated: 59 mL/min — ABNORMAL LOW (ref >60)
Glucose, Bld: 130 mg/dL — ABNORMAL HIGH (ref 70–99)
Potassium: 3.8 mmol/L (ref 3.5–5.1)
Sodium: 139 mmol/L (ref 135–145)
Total Bilirubin: 0.3 mg/dL (ref 0.3–1.2)
Total Protein: 6.9 g/dL (ref 6.5–8.1)

## 2018-11-16 LAB — CBC WITH DIFFERENTIAL (CANCER CENTER ONLY)
Abs Immature Granulocytes: 0.01 10*3/uL (ref 0.00–0.07)
Basophils Absolute: 0 10*3/uL (ref 0.0–0.1)
Basophils Relative: 1 %
Eosinophils Absolute: 0 10*3/uL (ref 0.0–0.5)
Eosinophils Relative: 1 %
HCT: 28.8 % — ABNORMAL LOW (ref 36.0–46.0)
Hemoglobin: 9.1 g/dL — ABNORMAL LOW (ref 12.0–15.0)
Immature Granulocytes: 0 %
Lymphocytes Relative: 21 %
Lymphs Abs: 0.7 10*3/uL (ref 0.7–4.0)
MCH: 31.2 pg (ref 26.0–34.0)
MCHC: 31.6 g/dL (ref 30.0–36.0)
MCV: 98.6 fL (ref 80.0–100.0)
Monocytes Absolute: 0.3 10*3/uL (ref 0.1–1.0)
Monocytes Relative: 11 %
NEUTROS PCT: 66 %
Neutro Abs: 2.1 10*3/uL (ref 1.7–7.7)
Platelet Count: 255 10*3/uL (ref 150–400)
RBC: 2.92 MIL/uL — ABNORMAL LOW (ref 3.87–5.11)
RDW: 18.2 % — ABNORMAL HIGH (ref 11.5–15.5)
WBC Count: 3.1 10*3/uL — ABNORMAL LOW (ref 4.0–10.5)
nRBC: 0.6 % — ABNORMAL HIGH (ref 0.0–0.2)

## 2018-11-16 MED ORDER — SODIUM CHLORIDE 0.9 % IV SOLN
20.0000 mg | Freq: Once | INTRAVENOUS | Status: AC
  Administered 2018-11-16: 20 mg via INTRAVENOUS
  Filled 2018-11-16: qty 2

## 2018-11-16 MED ORDER — DIPHENHYDRAMINE HCL 50 MG/ML IJ SOLN
50.0000 mg | Freq: Once | INTRAMUSCULAR | Status: AC
  Administered 2018-11-16: 50 mg via INTRAVENOUS

## 2018-11-16 MED ORDER — SODIUM CHLORIDE 0.9 % IV SOLN
80.0000 mg/m2 | Freq: Once | INTRAVENOUS | Status: AC
  Administered 2018-11-16: 138 mg via INTRAVENOUS
  Filled 2018-11-16: qty 23

## 2018-11-16 MED ORDER — DEXAMETHASONE SODIUM PHOSPHATE 10 MG/ML IJ SOLN
INTRAMUSCULAR | Status: AC
  Filled 2018-11-16: qty 1

## 2018-11-16 MED ORDER — FAMOTIDINE IN NACL 20-0.9 MG/50ML-% IV SOLN: 20.0000 mg | Freq: Once | INTRAVENOUS | Status: DC

## 2018-11-16 MED ORDER — SODIUM CHLORIDE 0.9% FLUSH
10.0000 mL | INTRAVENOUS | Status: DC | PRN
  Administered 2018-11-16: 10 mL
  Filled 2018-11-16: qty 10

## 2018-11-16 MED ORDER — GABAPENTIN 300 MG PO CAPS: 300.0000 mg | ORAL_CAPSULE | Freq: Every day | ORAL | 3 refills | Status: DC

## 2018-11-16 MED ORDER — DEXAMETHASONE SODIUM PHOSPHATE 10 MG/ML IJ SOLN
5.0000 mg | Freq: Once | INTRAMUSCULAR | Status: AC
  Administered 2018-11-16: 5 mg via INTRAVENOUS

## 2018-11-16 MED ORDER — DIPHENHYDRAMINE HCL 50 MG/ML IJ SOLN
INTRAMUSCULAR | Status: AC
  Filled 2018-11-16: qty 1

## 2018-11-16 MED ORDER — SODIUM CHLORIDE 0.9 % IV SOLN
Freq: Once | INTRAVENOUS | Status: AC
  Administered 2018-11-16: 11:00:00 via INTRAVENOUS
  Filled 2018-11-16: qty 250

## 2018-11-16 MED ORDER — HEPARIN SOD (PORK) LOCK FLUSH 100 UNIT/ML IV SOLN
500.0000 [IU] | Freq: Once | INTRAVENOUS | Status: AC | PRN
  Administered 2018-11-16: 500 [IU]
  Filled 2018-11-16: qty 5

## 2018-11-16 NOTE — Patient Instructions (Signed)
[facility] Discharge Instructions for Patients Receiving Chemotherapy  Today you received the following chemotherapy agents:  Taxol.  To help prevent nausea and vomiting after your treatment, we encourage you to take your nausea medication as directed.   If you develop nausea and vomiting that is not controlled by your nausea medication, call the clinic.   BELOW ARE SYMPTOMS THAT SHOULD BE REPORTED IMMEDIATELY:  *FEVER GREATER THAN 100.5 F  *CHILLS WITH OR WITHOUT FEVER  NAUSEA AND VOMITING THAT IS NOT CONTROLLED WITH YOUR NAUSEA MEDICATION  *UNUSUAL SHORTNESS OF BREATH  *UNUSUAL BRUISING OR BLEEDING  TENDERNESS IN MOUTH AND THROAT WITH OR WITHOUT PRESENCE OF ULCERS  *URINARY PROBLEMS  *BOWEL PROBLEMS  UNUSUAL RASH Items with * indicate a potential emergency and should be followed up as soon as possible.  Feel free to call the clinic should you have any questions or concerns. The clinic phone number is [phone].  Please show the CHEMO ALERT CARD at check-in to the Emergency Department and triage nurse.   

## 2018-11-16 NOTE — Progress Notes (Signed)
[facility] Progress Note   Jennifer Johns [id number] 1956-06-12 63 y.o.  Jennifer Johns is managed by Dr. Truitt Merle  Actively treated with chemotherapy/immunotherapy/hormonal therapy: yes  Current therapy: Paclitaxel  Last treated: 11/09/2018 (cycle 9, day 1)  Next scheduled appointment with provider: 11/30/2018  Assessment: Plan:    Malignant neoplasm of central portion of left breast in female, estrogen receptor negative ([place])  Peripheral neuropathy due to chemotherapy ([place]) - Plan: gabapentin (NEURONTIN) 300 MG capsule   Stage IIa, triple negative invasive ductal carcinoma of the left breast: Ms. Jennifer Johns presents to clinic today for consideration of cycle 10, day 1 of paclitaxel.  Her labs were reviewed.  We will proceed with her treatment today with the patient to return to clinic on 11/23/2018 for labs and for her next cycle of chemotherapy.  She will then return to see Dr. Burr Johns on 11/30/2018.  Peripheral neuropathy: The patient was given a prescription for gabapentin 300 mg p.o. nightly.  Please see After Visit Summary for patient specific instructions.  Future Appointments  Date Time Provider [place]  11/23/2018  8:30 AM [facility] LAB 2 [facility] None  11/23/2018  8:45 AM [facility] [place] FLUSH [facility] None  11/23/2018  9:15 AM [facility] INFUSION [facility] None  11/30/2018  8:00 AM [facility] LAB 2 [facility] None  11/30/2018  8:15 AM [facility] [place] FLUSH [facility] None  11/30/2018  8:45 AM Truitt Merle, MD [facility] None  11/30/2018  9:15 AM [facility] INFUSION [facility] None    No orders of the defined types were placed in this encounter.      Subjective:   Patient ID:  Jennifer Johns is a 63 y.o. (DOB 1955-10-02) female.  Chief Complaint: No chief complaint on file.   HPI Jennifer Johns is a 63 year old female with a diagnosis of a stage IIa, triple negative invasive ductal carcinoma of the left breast.  She is managed by Dr.  Burr Johns and presents to clinic today for consideration of cycle 10, day 1 of paclitaxel.  She reports that she continues to do well except for bilateral lower extremity peripheral neuropathy.  She is agreeable to begin a trial of gabapentin 300 mg p.o. nightly.  She denies fevers, chills, sweats, nausea, vomiting, constipation, or diarrhea.  Medications: I have reviewed the patient's current medications.  Allergies:  Allergies  Allergen Reactions  . Carrot [Daucus Carota] Hives and Shortness Of Breath  . Other Hives    "chitlins" or pig intestine    Past Medical History:  Diagnosis Date  . Asthma   . Cancer ([place])    Left Breast  . COPD (chronic obstructive pulmonary disease) ([place])   . Hypertension     Past Surgical History:  Procedure Laterality Date  . COLONOSCOPY N/A 03/18/2016   Procedure: COLONOSCOPY;  Surgeon: Danie Binder, MD;  [facility];  Service: Endoscopy;  Laterality: N/A;  10:30 AM  . PARTIAL MASTECTOMY WITH AXILLARY SENTINEL LYMPH NODE BIOPSY Left 06/07/2018   Procedure: LEFT BREAST PARTIAL MASTECTOMY WITH AXILLARY SENTINEL LYMPH NODE BIOPSY;  Surgeon: Coralie Keens, MD;  Location: [facility];  Service: General;  Laterality: Left;  . PORTACATH PLACEMENT N/A 06/07/2018   Procedure: INSERTION PORT-A-CATH;  Surgeon: Coralie Keens, MD;  Location: [facility];  Service: General;  Laterality: N/A;    Family History  Problem Relation Age of Onset  . Cancer Mother 76       breast cancer  . Cancer Father 45  prostate cancer    Social History   Socioeconomic History  . Marital status: Single    Spouse name: Not on file  . Number of children: Not on file  . Years of education: Not on file  . Highest education level: Not on file  Occupational History  . Not on file  Social Needs  . Financial resource strain: Not on file  . Food insecurity:    Worry: Not on file    Inability: Not on file  . Transportation needs:    Medical: Not on file     Non-medical: Not on file  Tobacco Use  . Smoking status: Current Some Day Smoker    Packs/day: 0.50    Years: 45.00    Pack years: 22.50  . Smokeless tobacco: Never Used  Substance and Sexual Activity  . Alcohol use: No  . Drug use: No  . Sexual activity: Not on file  Lifestyle  . Physical activity:    Days per week: Not on file    Minutes per session: Not on file  . Stress: Not on file  Relationships  . Social connections:    Talks on phone: Not on file    Gets together: Not on file    Attends religious service: Not on file    Active member of club or organization: Not on file    Attends meetings of clubs or organizations: Not on file    Relationship status: Not on file  . Intimate partner violence:    Fear of current or ex partner: Not on file    Emotionally abused: Not on file    Physically abused: Not on file    Forced sexual activity: Not on file  Other Topics Concern  . Not on file  Social History Narrative  . Not on file    Past Medical History, Surgical history, Social history, and Family history were reviewed and updated as appropriate.   Please see review of systems for further details on the patient's review from today.   Review of Systems:  Review of Systems  Constitutional: Negative for chills, diaphoresis and fever.  HENT: Negative for trouble swallowing and voice change.   Respiratory: Negative for cough, chest tightness, shortness of breath and wheezing.   Cardiovascular: Negative for chest pain and palpitations.  Gastrointestinal: Negative for abdominal pain, constipation, diarrhea, nausea and vomiting.  Musculoskeletal: Negative for back pain and myalgias.  Neurological: Positive for numbness. Negative for dizziness, light-headedness and headaches.    Objective:   Physical Exam:  BP (!) 143/78 (BP Location: Left Arm, Patient Position: Sitting) Comment: Jennifer Cruz RN is aware  Pulse (!) 101 Comment: Jennifer Johns is aware  Temp 98.2 F (36.8 C) (Oral)    Resp 18   Ht 5\' 6"  (1.676 m)   Wt 132 lb 12.8 oz (60.2 kg)   SpO2 100%   BMI 21.43 kg/m  ECOG: 1  Physical Exam Constitutional:      General: She is not in acute distress.    Appearance: She is not diaphoretic.  HENT:     Head: Normocephalic and atraumatic.     Mouth/Throat:     Mouth: Mucous membranes are moist.     Pharynx: No oropharyngeal exudate or posterior oropharyngeal erythema.  Eyes:     General: No scleral icterus.       Right eye: No discharge.        Left eye: No discharge.     Conjunctiva/sclera: Conjunctivae normal.  Cardiovascular:  Rate and Rhythm: Normal rate and regular rhythm.     Heart sounds: Normal heart sounds. No murmur. No friction rub. No gallop.   Pulmonary:     Effort: Pulmonary effort is normal. No respiratory distress.     Breath sounds: Normal breath sounds. No wheezing or rales.  Abdominal:     General: Abdomen is flat. Bowel sounds are normal. There is no distension.     Palpations: Abdomen is soft.     Tenderness: There is no abdominal tenderness. There is no guarding.  Skin:    General: Skin is warm and dry.     Findings: No erythema or rash.  Neurological:     Mental Status: She is alert.     Coordination: Coordination normal.     Gait: Gait normal.  Psychiatric:        Mood and Affect: Mood normal.        Behavior: Behavior normal.        Thought Content: Thought content normal.        Judgment: Judgment normal.     Lab Review:     Component Value Date/Time   NA 139 11/16/2018 0938   K 3.8 11/16/2018 0938   CL 104 11/16/2018 0938   CO2 26 11/16/2018 0938   GLUCOSE 130 (H) 11/16/2018 0938   BUN 18 11/16/2018 0938   CREATININE 1.00 11/16/2018 0938   CALCIUM 10.0 11/16/2018 0938   PROT 6.9 11/16/2018 0938   ALBUMIN 3.7 11/16/2018 0938   AST 13 (L) 11/16/2018 0938   ALT 12 11/16/2018 0938   ALKPHOS 66 11/16/2018 0938   BILITOT 0.3 11/16/2018 0938   GFRNONAA 59 (L) 11/16/2018 0938   GFRAA >60 11/16/2018 0938         Component Value Date/Time   WBC 3.1 (L) 11/16/2018 0938   WBC 4.9 08/21/2018 0500   RBC 2.92 (L) 11/16/2018 0938   HGB 9.1 (L) 11/16/2018 0938   HCT 28.8 (L) 11/16/2018 0938   PLT 255 11/16/2018 0938   MCV 98.6 11/16/2018 0938   MCH 31.2 11/16/2018 0938   MCHC 31.6 11/16/2018 0938   RDW 18.2 (H) 11/16/2018 0938   LYMPHSABS 0.7 11/16/2018 0938   MONOABS 0.3 11/16/2018 0938   EOSABS 0.0 11/16/2018 0938   BASOSABS 0.0 11/16/2018 0938   -------------------------------  Imaging from last 24 hours (if applicable):  Radiology interpretation: No results found.   This case was discussed with Dr. Burr Johns. She expressed agreement with my management of this patient.    OK to treat pending chemistry panel. Sandi Mealy, MHS, PA-C

## 2018-11-23 ENCOUNTER — Inpatient Hospital Stay: Payer: Medicaid Other

## 2018-11-23 ENCOUNTER — Inpatient Hospital Stay: Payer: Medicaid Other | Attending: Nurse Practitioner

## 2018-11-23 VITALS — BP 148/94 | HR 93 | Temp 98.4°F | Resp 18 | Ht 66.0 in | Wt 133.5 lb

## 2018-11-23 DIAGNOSIS — D6481 Anemia due to antineoplastic chemotherapy: Secondary | ICD-10-CM | POA: Diagnosis not present

## 2018-11-23 DIAGNOSIS — Z5111 Encounter for antineoplastic chemotherapy: Secondary | ICD-10-CM | POA: Insufficient documentation

## 2018-11-23 DIAGNOSIS — I10 Essential (primary) hypertension: Secondary | ICD-10-CM | POA: Insufficient documentation

## 2018-11-23 DIAGNOSIS — J449 Chronic obstructive pulmonary disease, unspecified: Secondary | ICD-10-CM | POA: Insufficient documentation

## 2018-11-23 DIAGNOSIS — Z171 Estrogen receptor negative status [ER-]: Secondary | ICD-10-CM

## 2018-11-23 DIAGNOSIS — Z95828 Presence of other vascular implants and grafts: Secondary | ICD-10-CM

## 2018-11-23 DIAGNOSIS — R739 Hyperglycemia, unspecified: Secondary | ICD-10-CM | POA: Diagnosis not present

## 2018-11-23 DIAGNOSIS — C50112 Malignant neoplasm of central portion of left female breast: Secondary | ICD-10-CM

## 2018-11-23 DIAGNOSIS — G620 Drug-induced polyneuropathy: Secondary | ICD-10-CM | POA: Insufficient documentation

## 2018-11-23 LAB — COMPREHENSIVE METABOLIC PANEL
ALT: 11 U/L (ref 0–44)
AST: 16 U/L (ref 15–41)
Albumin: 3.9 g/dL (ref 3.5–5.0)
Alkaline Phosphatase: 56 U/L (ref 38–126)
Anion gap: 4 — ABNORMAL LOW (ref 5–15)
BUN: 15 mg/dL (ref 8–23)
CO2: 29 mmol/L (ref 22–32)
Calcium: 10 mg/dL (ref 8.9–10.3)
Chloride: 106 mmol/L (ref 98–111)
Creatinine, Ser: 0.93 mg/dL (ref 0.44–1.00)
GFR calc Af Amer: >60 mL/min (ref >60)
GFR calc non Af Amer: >60 mL/min (ref >60)
Glucose, Bld: 101 mg/dL — ABNORMAL HIGH (ref 70–99)
Potassium: 3.7 mmol/L (ref 3.5–5.1)
Sodium: 139 mmol/L (ref 135–145)
Total Bilirubin: 0.3 mg/dL (ref 0.3–1.2)
Total Protein: 6.9 g/dL (ref 6.5–8.1)

## 2018-11-23 LAB — CBC WITH DIFFERENTIAL (CANCER CENTER ONLY)
Abs Immature Granulocytes: 0.02 10*3/uL (ref 0.00–0.07)
Basophils Absolute: 0 10*3/uL (ref 0.0–0.1)
Basophils Relative: 1 %
Eosinophils Absolute: 0 10*3/uL (ref 0.0–0.5)
Eosinophils Relative: 1 %
HCT: 28.8 % — ABNORMAL LOW (ref 36.0–46.0)
Hemoglobin: 9 g/dL — ABNORMAL LOW (ref 12.0–15.0)
Immature Granulocytes: 1 %
Lymphocytes Relative: 27 %
Lymphs Abs: 0.8 10*3/uL (ref 0.7–4.0)
MCH: 31.4 pg (ref 26.0–34.0)
MCHC: 31.3 g/dL (ref 30.0–36.0)
MCV: 100.3 fL — ABNORMAL HIGH (ref 80.0–100.0)
Monocytes Absolute: 0.3 10*3/uL (ref 0.1–1.0)
Monocytes Relative: 10 %
Neutro Abs: 1.9 10*3/uL (ref 1.7–7.7)
Neutrophils Relative %: 60 %
Platelet Count: 246 10*3/uL (ref 150–400)
RBC: 2.87 MIL/uL — ABNORMAL LOW (ref 3.87–5.11)
RDW: 17.2 % — ABNORMAL HIGH (ref 11.5–15.5)
WBC Count: 3.2 10*3/uL — ABNORMAL LOW (ref 4.0–10.5)
nRBC: 0 % (ref 0.0–0.2)

## 2018-11-23 MED ORDER — HEPARIN SOD (PORK) LOCK FLUSH 100 UNIT/ML IV SOLN
500.0000 [IU] | Freq: Once | INTRAVENOUS | Status: AC | PRN
  Administered 2018-11-23: 500 [IU]
  Filled 2018-11-23: qty 5

## 2018-11-23 MED ORDER — FAMOTIDINE IN NACL 20-0.9 MG/50ML-% IV SOLN: 20.0000 mg | Freq: Once | INTRAVENOUS | Status: DC

## 2018-11-23 MED ORDER — SODIUM CHLORIDE 0.9% FLUSH
10.0000 mL | INTRAVENOUS | Status: DC | PRN
  Administered 2018-11-23: 10 mL
  Filled 2018-11-23: qty 10

## 2018-11-23 MED ORDER — DIPHENHYDRAMINE HCL 50 MG/ML IJ SOLN
INTRAMUSCULAR | Status: AC
  Filled 2018-11-23: qty 1

## 2018-11-23 MED ORDER — DIPHENHYDRAMINE HCL 50 MG/ML IJ SOLN
50.0000 mg | Freq: Once | INTRAMUSCULAR | Status: AC
  Administered 2018-11-23: 50 mg via INTRAVENOUS

## 2018-11-23 MED ORDER — SODIUM CHLORIDE 0.9 % IV SOLN
Freq: Once | INTRAVENOUS | Status: AC
  Administered 2018-11-23: 10:00:00 via INTRAVENOUS
  Filled 2018-11-23: qty 250

## 2018-11-23 MED ORDER — DEXAMETHASONE SODIUM PHOSPHATE 10 MG/ML IJ SOLN
5.0000 mg | Freq: Once | INTRAMUSCULAR | Status: AC
  Administered 2018-11-23: 5 mg via INTRAVENOUS

## 2018-11-23 MED ORDER — DEXAMETHASONE SODIUM PHOSPHATE 10 MG/ML IJ SOLN
INTRAMUSCULAR | Status: AC
  Filled 2018-11-23: qty 1

## 2018-11-23 MED ORDER — SODIUM CHLORIDE 0.9 % IV SOLN
20.0000 mg | Freq: Once | INTRAVENOUS | Status: AC
  Administered 2018-11-23: 20 mg via INTRAVENOUS
  Filled 2018-11-23: qty 2

## 2018-11-23 MED ORDER — SODIUM CHLORIDE 0.9 % IV SOLN
80.0000 mg/m2 | Freq: Once | INTRAVENOUS | Status: AC
  Administered 2018-11-23: 138 mg via INTRAVENOUS
  Filled 2018-11-23: qty 23

## 2018-11-23 NOTE — Patient Instructions (Signed)
[facility] Discharge Instructions for Patients Receiving Chemotherapy  Today you received the following chemotherapy agents: Paclitaxel (Taxol).  To help prevent nausea and vomiting after your treatment, we encourage you to take your nausea medication as directed.    If you develop nausea and vomiting that is not controlled by your nausea medication, call the clinic.   BELOW ARE SYMPTOMS THAT SHOULD BE REPORTED IMMEDIATELY:  *FEVER GREATER THAN 100.5 F  *CHILLS WITH OR WITHOUT FEVER  NAUSEA AND VOMITING THAT IS NOT CONTROLLED WITH YOUR NAUSEA MEDICATION  *UNUSUAL SHORTNESS OF BREATH  *UNUSUAL BRUISING OR BLEEDING  TENDERNESS IN MOUTH AND THROAT WITH OR WITHOUT PRESENCE OF ULCERS  *URINARY PROBLEMS  *BOWEL PROBLEMS  UNUSUAL RASH Items with * indicate a potential emergency and should be followed up as soon as possible.  Feel free to call the clinic should you have any questions or concerns. The clinic phone number is [phone].  Please show the CHEMO ALERT CARD at check-in to the Emergency Department and triage nurse.   

## 2018-11-28 NOTE — Progress Notes (Signed)
[place]   [phone] [phone]   Clinic Follow up Note   Patient Care Team: [facility] as PCP - General Coralie Keens, MD as Consulting Physician (General Surgery) Truitt Merle, MD as Consulting Physician (Hematology) 11/30/2018  CHIEF COMPLAINT: F/u on left breast cancer   SUMMARY OF ONCOLOGIC HISTORY:   Cancer of central portion of left female breast ([place])   04/28/2018 Cancer Staging    Staging form: Breast, AJCC 8th Edition - Clinical stage from 04/28/2018: Stage IB (cT1c, cN0, cM0, G3, ER-, PR-, HER2-) - Signed by Truitt Merle, MD on 05/24/2018    05/24/2018 Initial Diagnosis    Cancer of central portion of left female breast ([place])    06/02/2018 Cancer Staging    Staging form: Breast, AJCC 8th Edition - Pathologic stage from 06/02/2018: Stage IIA (pT2, pN0, cM0, G3, ER-, PR-, HER2-) - Signed by Truitt Merle, MD on 06/23/2018    06/07/2018 Surgery    LEFT BREAST PARTIAL MASTECTOMY WITH AXILLARY SENTINEL LYMPH NODE BIOPSY and PAC placement by Dr. Ninfa Linden  06/07/18    06/07/2018 Pathology Results    Diagnosis 06/07/18 1. Breast, partial mastectomy, Left - INVASIVE DUCTAL CARCINOMA, GRADE III, 2.1 CM 1 of 4 FINAL for Jennifer Johns, Jennifer Johns [phone]) Diagnosis(continued) - SURGICAL RESECTION MARGINS ARE NEGATIVE FOR CARCINOMA. - NEGATIVE FOR LYMPHOVASCULAR OR PERINEURAL INVASION. - BIOPSY SITE CHANGES. - SEE ONCOLOGY TABLE. - SEE NOTE. 2. Lymph node, sentinel, biopsy, Left Axillary - LYMPH NODE, NEGATIVE FOR CARCINOMA (0/1). 3. Lymph node, sentinel, biopsy, Left - LYMPH NODE, NEGATIVE FOR CARCINOMA (0/1). 4. Lymph node, sentinel, biopsy, Left - LYMPH NODE, NEGATIVE FOR CARCINOMA (0/1). 5. Lymph node, sentinel, biopsy, Left - LYMPH NODE, NEGATIVE FOR CARCINOMA (0/1). 6. Lymph node, sentinel, biopsy, Left - LYMPH NODE, NEGATIVE FOR CARCINOMA (0/1). 7. Lymph node, sentinel, biopsy, Left - LYMPH NODE, NEGATIVE FOR  CARCINOMA (0/1). The tumor cells are Negative for Her2 (0). Estrogen Receptor: 0%, NEGATIVE Progesterone Receptor: 0%, NEGATIVE Proliferation Marker Ki67: 40%    06/08/2018 Breast MRI    MRI Breast B/l 05/29/18 IMPRESSION: Solitary mass within the UPPER central portion of the LEFT breast, 2.3 Centimeters in maximum diameter. No MRI evidence for adenopathy. RIGHT breast is negative. LEFT BREAST PARTIAL MASTECTOMY WITH AXILLARY SENTINEL LYMPH NODE BIOPSY with PAC placement by Dr. Ninfa Linden 06/07/18    07/11/2018 Imaging    Whole Body Bone Scan 07/11/18  IMPRESSION: No scintigraphic evidence of osseous metastatic disease.    07/13/2018 -  Chemotherapy    Adjuvant AC every 2 weeks for 4 cycles, 07/13/18-08/24/18. Followed by Taxol every 2 weeks starting 09/11/18    08/20/2018 Imaging    CT AP W Contrast 08/20/18  IMPRESSION: No acute findings in the abdomen/pelvis. Mild prominence of the main pancreatic duct measuring 6 mm. No evidence of mass, adenopathy or ductal stones. Subtle focal nodularity over the posterior left lower lobe which may be acute or chronic and may be due to an infectious versus atypical infectious or inflammatory process. Recommend follow-up chest CT 4-6 weeks. Two subcentimeter liver hypodensities too small to characterize but likely cysts. Aortic Atherosclerosis (ICD10-I70.0).     CURRENT THERAPY  Taxolweeklystartingweek of 09/11/18   INTERVAL HISTORY: Jennifer Johns is a 63 y.o. female who is here for follow-up. She saw PA Lucianne Lei on 11/16/2018 due to her worsening bilateral lower extremity peripheral neuropathy and started gabapentin, but she is not very compliant.  She describes numbness and a mild tingling  in her fingers and toes, no significant impact on her hand function.  She has been using ice pack during the chemo infusion.  She otherwise feels well, mild fatigue, able to function well at home.  No fever, nausea, change of bowel habits, or other new  complaints.  This is her last treatment today.   Pertinent positives and negatives of review of systems are listed and detailed within the above HPI.  REVIEW OF SYSTEMS:   Constitutional: Denies fevers, chills or abnormal weight loss, (+) mild fatigue Eyes: Denies blurriness of vision Ears, nose, mouth, throat, and face: Denies mucositis or sore throat Respiratory: Denies cough, dyspnea or wheezes Cardiovascular: Denies palpitation, chest discomfort or lower extremity swelling Gastrointestinal:  Denies nausea, heartburn or change in bowel habits Skin: Denies abnormal skin rashes Lymphatics: Denies new lymphadenopathy or easy bruising Neurological: ((+) Mild tingling and numbness on fingers and toes Behavioral/Psych: Mood is stable, no new changes  All other systems were reviewed with the patient and are negative.  MEDICAL HISTORY:  Past Medical History:  Diagnosis Date  . Asthma   . Cancer ([place])    Left Breast  . COPD (chronic obstructive pulmonary disease) ([place])   . Hypertension     SURGICAL HISTORY: Past Surgical History:  Procedure Laterality Date  . COLONOSCOPY N/A 03/18/2016   Procedure: COLONOSCOPY;  Surgeon: Danie Binder, MD;  [facility];  Service: Endoscopy;  Laterality: N/A;  10:30 AM  . PARTIAL MASTECTOMY WITH AXILLARY SENTINEL LYMPH NODE BIOPSY Left 06/07/2018   Procedure: LEFT BREAST PARTIAL MASTECTOMY WITH AXILLARY SENTINEL LYMPH NODE BIOPSY;  Surgeon: Coralie Keens, MD;  Location: [place];  Service: General;  Laterality: Left;  . PORTACATH PLACEMENT N/A 06/07/2018   Procedure: INSERTION PORT-A-CATH;  Surgeon: Coralie Keens, MD;  Location: [place];  Service: General;  Laterality: N/A;    I have reviewed the social history and family history with the patient and they are unchanged from previous note.  ALLERGIES:  is allergic to carrot [daucus carota] and other.  MEDICATIONS:  Current Outpatient Medications  Medication Sig Dispense Refill  .  albuterol (ACCUNEB) 1.25 MG/3ML nebulizer solution Take 1 ampule by nebulization every 6 (six) hours as needed for wheezing or shortness of breath.     Marland Kitchen albuterol (PROVENTIL HFA;VENTOLIN HFA) 108 (90 Base) MCG/ACT inhaler Inhale 2 puffs into the lungs every 6 (six) hours as needed for wheezing or shortness of breath.     . cholecalciferol (VITAMIN D) 1000 units tablet Take 1,000 Units by mouth daily.   1  . gabapentin (NEURONTIN) 300 MG capsule Take 1 capsule (300 mg total) by mouth at bedtime. 30 capsule 3  . lidocaine-prilocaine (EMLA) cream Apply to affected area once 30 g 3  . lisinopril (PRINIVIL,ZESTRIL) 10 MG tablet Take 10 mg by mouth daily.    Marland Kitchen loperamide (IMODIUM) 2 MG capsule Take 2 tabs with first watery stool then 1 tab after each subsequent watery stool. Do not exceed 8 tabs per day 40 capsule 1  . ondansetron (ZOFRAN) 8 MG tablet Take 1 tablet (8 mg total) by mouth 2 (two) times daily as needed. Start on the third day after chemotherapy. 30 tablet 1  . prochlorperazine (COMPAZINE) 10 MG tablet Take 1 tablet (10 mg total) by mouth every 6 (six) hours as needed (Nausea or vomiting). 30 tablet 1  . ranitidine (ZANTAC) 150 MG tablet Take 150 mg by mouth daily.   1   No current facility-administered medications for this  visit.     PHYSICAL EXAMINATION: ECOG PERFORMANCE STATUS: 1 - Symptomatic but completely ambulatory  Vitals:   11/30/18 1031  BP: (!) 153/80  Pulse: 92  Resp: 18  Temp: 98.4 F (36.9 C)  SpO2: 100%   Filed Weights   11/30/18 1031  Weight: 136 lb 1.6 oz (61.7 kg)    GENERAL:alert, no distress and comfortable SKIN: skin color, texture, turgor are normal, no rashes or significant lesions EYES: normal, Conjunctiva are pink and non-injected, sclera clear OROPHARYNX:no exudate, no erythema and lips, buccal mucosa, and tongue normal  NECK: supple, thyroid normal size, non-tender, without nodularity LYMPH:  no palpable lymphadenopathy in the cervical, axillary  or inguinal LUNGS: clear to auscultation and percussion with normal breathing effort HEART: regular rate & rhythm and no murmurs and no lower extremity edema ABDOMEN:abdomen soft, non-tender and normal bowel sounds Musculoskeletal:no cyanosis of digits and no clubbing  NEURO: alert & oriented x 3 with fluent speech, no focal motor/sensory deficits  LABORATORY DATA:  I have reviewed the data as listed CBC Latest Ref Rng & Units 11/30/2018 11/23/2018 11/16/2018  WBC 4.0 - 10.5 K/uL 2.9(L) 3.2(L) 3.1(L)  Hemoglobin 12.0 - 15.0 g/dL 8.8(L) 9.0(L) 9.1(L)  Hematocrit 36.0 - 46.0 % 28.0(L) 28.8(L) 28.8(L)  Platelets 150 - 400 K/uL 247 246 255     CMP Latest Ref Rng & Units 11/30/2018 11/23/2018 11/16/2018  Glucose 70 - 99 mg/dL 103(H) 101(H) 130(H)  BUN 8 - 23 mg/dL 11 15 18   Creatinine 0.44 - 1.00 mg/dL 1.15(H) 0.93 1.00  Sodium 135 - 145 mmol/L 140 139 139  Potassium 3.5 - 5.1 mmol/L 3.8 3.7 3.8  Chloride 98 - 111 mmol/L 106 106 104  CO2 22 - 32 mmol/L 25 29 26   Calcium 8.9 - 10.3 mg/dL 9.9 10.0 10.0  Total Protein 6.5 - 8.1 g/dL 6.5 6.9 6.9  Total Bilirubin 0.3 - 1.2 mg/dL 0.3 0.3 0.3  Alkaline Phos 38 - 126 U/L 76 56 66  AST 15 - 41 U/L 12(L) 16 13(L)  ALT 0 - 44 U/L 9 11 12       RADIOGRAPHIC STUDIES: I have personally reviewed the radiological images as listed and agreed with the findings in the report. No results found.   ASSESSMENT & PLAN:  Jennifer Johns is a 63 y.o. female with history of  1. 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 Treated with lumpectomy and adjuvant chemo. She previously completed 4 cycles of AC, but didn't tolerate well. I do not think she can tolerate Carboplatin. She is currently on weekly Taxol. Tolerating well with mild intermittent neuropathy. -She is clinically stable, now has persistent mild neuropathy, on gabapentin, lab reviewed, adequate for treatment,  I will decrease Taxol dose to 60 mg/m for the last dose today. -Refer her back to Dr. Lisbeth Renshaw for adjuvant breast radiation -We will follow-up in a months   2.Anemia, secondary to chemo  - I previously recommended her to take prenatal vitamins -Will consider blood transfusion if Hg <8. - Labs reviewed, Hg 8.8, stable, no need blood transfusion.  3. Peripheral neuropathy, secondary to chemo, G1 - She saw PA Lucianne Lei on 11/16/18 due to her worsening bilateral lower extremity peripheral neuropathy  - She is on gabapentin 300 mg p.o. nightly, but not very compliant -Also encouraged her to take B complex   4. COPD, HTN -f/u with PCP and continue medications -I have previously advised her to  monitor her BP at home and decrease salt intake  -stable    5. Hyperglycemia -No known history of DM. Likely related to steroids. -I previously reduced her premedication steroids to 5 mg.  - I will monitor her BG, her BG 103 today, stable    Plan -Labs reviewed, good to proceed with last cycle chemotaxol today with dose reduction to 60 mg/m -will refer her back to rad/onc Dr. Lisbeth Renshaw in 2 weeks -f/u in 4 weeks    No problem-specific Assessment & Plan notes found for this encounter.   No orders of the defined types were placed in this encounter.  All questions were answered. The patient knows to call the clinic with any problems, questions or concerns. No barriers to learning was detected. I spent 20 minutes counseling the patient face to face. The total time spent in the appointment was 25 minutes and more than 50% was on counseling and review of test results  I, Manson Allan am acting as scribe for Dr. Truitt Merle.  I have reviewed the above documentation for accuracy and completeness, and I agree with the above.     Truitt Merle, MD 11/30/2018

## 2018-11-30 ENCOUNTER — Other Ambulatory Visit: Payer: Self-pay

## 2018-11-30 ENCOUNTER — Encounter: Payer: Self-pay | Admitting: Hematology

## 2018-11-30 ENCOUNTER — Inpatient Hospital Stay: Payer: Medicaid Other

## 2018-11-30 ENCOUNTER — Inpatient Hospital Stay (HOSPITAL_BASED_OUTPATIENT_CLINIC_OR_DEPARTMENT_OTHER): Payer: Medicaid Other | Admitting: Hematology

## 2018-11-30 ENCOUNTER — Telehealth: Payer: Self-pay | Admitting: Radiation Oncology

## 2018-11-30 ENCOUNTER — Telehealth: Payer: Self-pay | Admitting: Hematology

## 2018-11-30 VITALS — BP 153/80 | HR 92 | Temp 98.4°F | Resp 18 | Ht 66.0 in | Wt 136.1 lb

## 2018-11-30 DIAGNOSIS — Z171 Estrogen receptor negative status [ER-]: Secondary | ICD-10-CM | POA: Diagnosis not present

## 2018-11-30 DIAGNOSIS — Z5111 Encounter for antineoplastic chemotherapy: Secondary | ICD-10-CM | POA: Diagnosis not present

## 2018-11-30 DIAGNOSIS — G620 Drug-induced polyneuropathy: Secondary | ICD-10-CM

## 2018-11-30 DIAGNOSIS — D6481 Anemia due to antineoplastic chemotherapy: Secondary | ICD-10-CM

## 2018-11-30 DIAGNOSIS — C50112 Malignant neoplasm of central portion of left female breast: Secondary | ICD-10-CM

## 2018-11-30 DIAGNOSIS — I10 Essential (primary) hypertension: Secondary | ICD-10-CM

## 2018-11-30 DIAGNOSIS — R739 Hyperglycemia, unspecified: Secondary | ICD-10-CM

## 2018-11-30 DIAGNOSIS — J449 Chronic obstructive pulmonary disease, unspecified: Secondary | ICD-10-CM

## 2018-11-30 LAB — COMPREHENSIVE METABOLIC PANEL
ALT: 9 U/L (ref 0–44)
AST: 12 U/L — ABNORMAL LOW (ref 15–41)
Albumin: 3.4 g/dL — ABNORMAL LOW (ref 3.5–5.0)
Alkaline Phosphatase: 76 U/L (ref 38–126)
Anion gap: 9 (ref 5–15)
BUN: 11 mg/dL (ref 8–23)
CO2: 25 mmol/L (ref 22–32)
Calcium: 9.9 mg/dL (ref 8.9–10.3)
Chloride: 106 mmol/L (ref 98–111)
Creatinine, Ser: 1.15 mg/dL — ABNORMAL HIGH (ref 0.44–1.00)
GFR calc Af Amer: 58 mL/min — ABNORMAL LOW (ref >60)
GFR calc non Af Amer: 50 mL/min — ABNORMAL LOW (ref >60)
Glucose, Bld: 103 mg/dL — ABNORMAL HIGH (ref 70–99)
Potassium: 3.8 mmol/L (ref 3.5–5.1)
Sodium: 140 mmol/L (ref 135–145)
Total Bilirubin: 0.3 mg/dL (ref 0.3–1.2)
Total Protein: 6.5 g/dL (ref 6.5–8.1)

## 2018-11-30 LAB — CBC WITH DIFFERENTIAL/PLATELET
Abs Immature Granulocytes: 0.01 10*3/uL (ref 0.00–0.07)
Basophils Absolute: 0 10*3/uL (ref 0.0–0.1)
Basophils Relative: 1 %
Eosinophils Absolute: 0 10*3/uL (ref 0.0–0.5)
Eosinophils Relative: 1 %
HCT: 28 % — ABNORMAL LOW (ref 36.0–46.0)
Hemoglobin: 8.8 g/dL — ABNORMAL LOW (ref 12.0–15.0)
Immature Granulocytes: 0 %
Lymphocytes Relative: 26 %
Lymphs Abs: 0.7 10*3/uL (ref 0.7–4.0)
MCH: 31.4 pg (ref 26.0–34.0)
MCHC: 31.4 g/dL (ref 30.0–36.0)
MCV: 100 fL (ref 80.0–100.0)
Monocytes Absolute: 0.4 10*3/uL (ref 0.1–1.0)
Monocytes Relative: 13 %
Neutro Abs: 1.7 10*3/uL (ref 1.7–7.7)
Neutrophils Relative %: 59 %
PLATELETS: 247 10*3/uL (ref 150–400)
RBC: 2.8 MIL/uL — ABNORMAL LOW (ref 3.87–5.11)
RDW: 16.8 % — ABNORMAL HIGH (ref 11.5–15.5)
WBC: 2.9 10*3/uL — ABNORMAL LOW (ref 4.0–10.5)
nRBC: 0 % (ref 0.0–0.2)

## 2018-11-30 MED ORDER — SODIUM CHLORIDE 0.9 % IV SOLN
Freq: Once | INTRAVENOUS | Status: AC
  Administered 2018-11-30: 11:00:00 via INTRAVENOUS
  Filled 2018-11-30: qty 250

## 2018-11-30 MED ORDER — SODIUM CHLORIDE 0.9 % IV SOLN
20.0000 mg | Freq: Once | INTRAVENOUS | Status: AC
  Administered 2018-11-30: 20 mg via INTRAVENOUS
  Filled 2018-11-30: qty 2

## 2018-11-30 MED ORDER — HEPARIN SOD (PORK) LOCK FLUSH 100 UNIT/ML IV SOLN
500.0000 [IU] | Freq: Once | INTRAVENOUS | Status: AC | PRN
  Administered 2018-11-30: 500 [IU]
  Filled 2018-11-30: qty 5

## 2018-11-30 MED ORDER — DIPHENHYDRAMINE HCL 50 MG/ML IJ SOLN
INTRAMUSCULAR | Status: AC
  Filled 2018-11-30: qty 1

## 2018-11-30 MED ORDER — DIPHENHYDRAMINE HCL 50 MG/ML IJ SOLN
50.0000 mg | Freq: Once | INTRAMUSCULAR | Status: AC
  Administered 2018-11-30: 50 mg via INTRAVENOUS

## 2018-11-30 MED ORDER — DEXAMETHASONE SODIUM PHOSPHATE 10 MG/ML IJ SOLN
INTRAMUSCULAR | Status: AC
  Filled 2018-11-30: qty 1

## 2018-11-30 MED ORDER — SODIUM CHLORIDE 0.9 % IV SOLN
60.0000 mg/m2 | Freq: Once | INTRAVENOUS | Status: AC
  Administered 2018-11-30: 102 mg via INTRAVENOUS
  Filled 2018-11-30: qty 17

## 2018-11-30 MED ORDER — SODIUM CHLORIDE 0.9% FLUSH
10.0000 mL | INTRAVENOUS | Status: DC | PRN
  Administered 2018-11-30: 10 mL
  Filled 2018-11-30: qty 10

## 2018-11-30 MED ORDER — DEXAMETHASONE SODIUM PHOSPHATE 10 MG/ML IJ SOLN
5.0000 mg | Freq: Once | INTRAMUSCULAR | Status: AC
  Administered 2018-11-30: 5 mg via INTRAVENOUS

## 2018-11-30 MED ORDER — FAMOTIDINE IN NACL 20-0.9 MG/50ML-% IV SOLN: 20.0000 mg | Freq: Once | INTRAVENOUS | Status: DC

## 2018-11-30 NOTE — Patient Instructions (Signed)
[facility] Discharge Instructions for Patients Receiving Chemotherapy  Today you received the following chemotherapy agents:  Taxol.  To help prevent nausea and vomiting after your treatment, we encourage you to take your nausea medication as directed.   If you develop nausea and vomiting that is not controlled by your nausea medication, call the clinic.   BELOW ARE SYMPTOMS THAT SHOULD BE REPORTED IMMEDIATELY:  *FEVER GREATER THAN 100.5 F  *CHILLS WITH OR WITHOUT FEVER  NAUSEA AND VOMITING THAT IS NOT CONTROLLED WITH YOUR NAUSEA MEDICATION  *UNUSUAL SHORTNESS OF BREATH  *UNUSUAL BRUISING OR BLEEDING  TENDERNESS IN MOUTH AND THROAT WITH OR WITHOUT PRESENCE OF ULCERS  *URINARY PROBLEMS  *BOWEL PROBLEMS  UNUSUAL RASH Items with * indicate a potential emergency and should be followed up as soon as possible.  Feel free to call the clinic should you have any questions or concerns. The clinic phone number is [phone].  Please show the CHEMO ALERT CARD at check-in to the Emergency Department and triage nurse.   

## 2018-11-30 NOTE — Telephone Encounter (Signed)
New message:    Cld pt to schedule from referral received.

## 2018-11-30 NOTE — Progress Notes (Signed)
These preliminary result these preliminary results were noted.  Awaiting final report.

## 2018-11-30 NOTE — Telephone Encounter (Signed)
Called and scheduled appt 3/12 los.  Patient aware of appt date and time.

## 2018-12-04 ENCOUNTER — Telehealth: Payer: Self-pay | Admitting: Radiation Oncology

## 2018-12-04 NOTE — Telephone Encounter (Signed)
New message:     Lft vcmail to call back and schedule from referral received.

## 2018-12-18 ENCOUNTER — Encounter: Payer: Self-pay | Admitting: General Practice

## 2018-12-18 NOTE — Progress Notes (Signed)
[place] Team contacted patient to assess for food insecurity and other psychosocial needs during current COVID19 pandemic.   Patient/family expressed no needs at this time. Per Ms Malstrom, her daughter checks on her every day and helps her access food and basic necessities, which she pays for with food stamps. Support Team member encouraged patient to call if changes occur or they have any other questions/concerns.    [place], [place], [facility] Pager [phone] Voicemail [phone]

## 2018-12-27 ENCOUNTER — Ambulatory Visit
Admission: RE | Admit: 2018-12-27 | Discharge: 2018-12-27 | Disposition: A | Discharge status: Home / Self Care | Payer: Medicaid Other | Source: Ambulatory Visit | Attending: Radiation Oncology | Admitting: Radiation Oncology

## 2018-12-27 ENCOUNTER — Other Ambulatory Visit: Payer: Self-pay

## 2018-12-27 ENCOUNTER — Encounter: Payer: Self-pay | Admitting: Radiation Oncology

## 2018-12-27 VITALS — Ht 66.0 in | Wt 138.0 lb

## 2018-12-27 DIAGNOSIS — C50211 Malignant neoplasm of upper-inner quadrant of right female breast: Secondary | ICD-10-CM

## 2018-12-27 DIAGNOSIS — Z171 Estrogen receptor negative status [ER-]: Principal | ICD-10-CM

## 2018-12-27 DIAGNOSIS — C50112 Malignant neoplasm of central portion of left female breast: Secondary | ICD-10-CM

## 2018-12-27 HISTORY — DX: Malignant neoplasm of unspecified site of unspecified female breast: C50.919

## 2018-12-27 NOTE — Progress Notes (Signed)
Radiation Oncology         [phone]   Initial Outpatient Consultation - Conducted via telephone due to current COVID-19 concerns for limiting patient exposure  I spoke with the patient to conduct this consult visit via telephone to spare the patient unnecessary potential exposure in the healthcare setting during the current COVID-19 pandemic. The patient was notified in advance and was offered a [place] meeting to allow for face to face communication   ________________________________  Name: Jennifer Johns        MRN: [id number]  Date of Service: 12/27/2018 DOB: 10-02-1955  CC:[facility]  Truitt Merle, MD     REFERRING PHYSICIAN: Truitt Merle, MD   DIAGNOSIS: The encounter diagnosis was Malignant neoplasm of central portion of left breast in female, estrogen receptor negative ([place]).   HISTORY OF PRESENT ILLNESS: Jennifer Johns is a 63 y.o. female with a diagnosis of left breast cancer. The patient was noted to have a mass in the left breast that she palpated at home.  She had a mammogram in February 2018 that was negative, and has no prior history of masses or cancer.  She underwent diagnostic imaging with mammogram in [place] revealing a 1.7 cm mass at 12:00.  Ultrasound revealed an 18 mm lesion in the upper inner quadrant, and no evidence of any adenopathy.  A biopsy on 04/28/2018 revealed a grade 3 invasive ductal carcinoma which was triple negative, and a Ki-67 of 75%.  She went on to MRI on 05/29/18 that revealed a 2.3 x 2.2 x 2.0 cm mass in the left breast. No adenopathy was noted. On 06/07/18 she underwent left lumpectomy with sentinel node biopsy and final pathology reveated a 2.1 cm grade 3 invasive ductal carcinoma with negative surgical resection margins, and 6 negative lymph nodes. She proceeded with adjuvant chemotherapy between 07/13/18 and 11/30/2018. She is contacted by phone to discuss next steps for adjuvant therapy.   PREVIOUS RADIATION THERAPY:  No   PAST MEDICAL HISTORY:  Past Medical History:  Diagnosis Date  . Asthma   . Cancer ([place])    Left Breast  . COPD (chronic obstructive pulmonary disease) ([place])   . Hypertension        PAST SURGICAL HISTORY: Past Surgical History:  Procedure Laterality Date  . COLONOSCOPY N/A 03/18/2016   Procedure: COLONOSCOPY;  Surgeon: Danie Binder, MD;  [facility];  Service: Endoscopy;  Laterality: N/A;  10:30 AM  . PARTIAL MASTECTOMY WITH AXILLARY SENTINEL LYMPH NODE BIOPSY Left 06/07/2018   Procedure: LEFT BREAST PARTIAL MASTECTOMY WITH AXILLARY SENTINEL LYMPH NODE BIOPSY;  Surgeon: Coralie Keens, MD;  Location: [facility];  Service: General;  Laterality: Left;  . PORTACATH PLACEMENT N/A 06/07/2018   Procedure: INSERTION PORT-A-CATH;  Surgeon: Coralie Keens, MD;  Location: [facility] OR;  Service: General;  Laterality: N/A;     FAMILY HISTORY:  Family History  Problem Relation Age of Onset  . Cancer Mother 37       breast cancer  . Cancer Father 76       prostate cancer     SOCIAL HISTORY:  reports that she has been smoking. She has a 22.50 pack-year smoking history. She has never used smokeless tobacco. She reports that she does not drink alcohol or use drugs.  The patient is single and resides in [facility].  She is disabled but helps to take care of her granddaughter.   ALLERGIES: Carrot [daucus carota] and  Other   MEDICATIONS:  Current Outpatient Medications  Medication Sig Dispense Refill  . albuterol (ACCUNEB) 1.25 MG/3ML nebulizer solution Take 1 ampule by nebulization every 6 (six) hours as needed for wheezing or shortness of breath.     Marland Kitchen albuterol (PROVENTIL HFA;VENTOLIN HFA) 108 (90 Base) MCG/ACT inhaler Inhale 2 puffs into the lungs every 6 (six) hours as needed for wheezing or shortness of breath.     . cholecalciferol (VITAMIN D) 1000 units tablet Take 1,000 Units by mouth daily.   1  . gabapentin (NEURONTIN) 300 MG capsule Take 1 capsule (300 mg  total) by mouth at bedtime. 30 capsule 3  . lidocaine-prilocaine (EMLA) cream Apply to affected area once 30 g 3  . lisinopril (PRINIVIL,ZESTRIL) 10 MG tablet Take 10 mg by mouth daily.    Marland Kitchen loperamide (IMODIUM) 2 MG capsule Take 2 tabs with first watery stool then 1 tab after each subsequent watery stool. Do not exceed 8 tabs per day 40 capsule 1  . ondansetron (ZOFRAN) 8 MG tablet Take 1 tablet (8 mg total) by mouth 2 (two) times daily as needed. Start on the third day after chemotherapy. 30 tablet 1  . prochlorperazine (COMPAZINE) 10 MG tablet Take 1 tablet (10 mg total) by mouth every 6 (six) hours as needed (Nausea or vomiting). 30 tablet 1  . ranitidine (ZANTAC) 150 MG tablet Take 150 mg by mouth daily.   1   No current facility-administered medications for this visit.      REVIEW OF SYSTEMS: On review of systems, the patient reports that she is doing well overall. She has experieinced what she calls mild neuropathy in her toes of her feet with chemotherapy. Her fatigue is improved. She denies any chest pain, shortness of breath, cough, fevers, chills, night sweats, unintended weight changes. She denies any bowel or bladder disturbances, and denies abdominal pain, nausea or vomiting. She denies any new musculoskeletal or joint aches or pains. A complete review of systems is obtained and is otherwise negative.     PHYSICAL EXAM:  Unable to assess due to type of encounter    ECOG = 0  0 - Asymptomatic (Fully active, able to carry on all predisease activities without restriction)  1 - Symptomatic but completely ambulatory (Restricted in physically strenuous activity but ambulatory and able to carry out work of a light or sedentary nature. For example, light housework, office work)  2 - Symptomatic, <50% in bed during the day (Ambulatory and capable of all self care but unable to carry out any work activities. Up and about more than 50% of waking hours)  3 - Symptomatic, >50% in bed,  but not bedbound (Capable of only limited self-care, confined to bed or chair 50% or more of waking hours)  4 - Bedbound (Completely disabled. Cannot carry on any self-care. Totally confined to bed or chair)  5 - Death   Eustace Pen MM, Creech RH, Tormey DC, et al. [phone]). "Toxicity and response criteria of the [facility]". [place] Oncol. 5 (6): 649-55    LABORATORY DATA:  Lab Results  Component Value Date   WBC 2.9 (L) 11/30/2018   HGB 8.8 (L) 11/30/2018   HCT 28.0 (L) 11/30/2018   MCV 100.0 11/30/2018   PLT 247 11/30/2018   Lab Results  Component Value Date   NA 140 11/30/2018   K 3.8 11/30/2018   CL 106 11/30/2018   CO2 25 11/30/2018   Lab Results  Component Value Date  ALT 9 11/30/2018   AST 12 (L) 11/30/2018   ALKPHOS 76 11/30/2018   BILITOT 0.3 11/30/2018      RADIOGRAPHY: No results found.     IMPRESSION/PLAN: 1. Stage IIA, pT2N0M0, grade 3 triple negative invasive ductal carcinoma of the left breast. Dr. Lisbeth Renshaw has reviewed her case and recommends consideration of adjuvant radiotherapy to reduce the risk of local recurrence.  We discussed the risks, benefits, short, and long term effects of radiotherapy, and the patient is interested in proceeding. Dr. Lisbeth Renshaw discusses the delivery and logistics of radiotherapy and recommends a course of 4  weeks of radiotherapy. She will be 4 weeks out from her last chemotherapy treatment tomorrow. She will come in person to proceed with simulation tomorrow. We anticipate her treatment starting in the next 2-3 weeks due to coronavirus pandemic. She is in agreement and gives verbal consent to proceed. She will sign paper consent tomorrow in person.     Given current concerns for patient exposure during the COVID-19 pandemic, this encounter was conducted via telephone.  The patient has given verbal consent for this type of encounter. The time spent during this encounter was 25 minutes and 50% of that time was  spent in the coordination of his care. The attendants for this meeting include Shona Simpson, [facility] and Claris Gladden During the encounter, Shona Simpson [facility] was located at [facility] Radiation Oncology Department.  Jone Panebianco was located at home.    Carola Rhine, PAC

## 2018-12-27 NOTE — Progress Notes (Signed)
See progress note under physician encounter. 

## 2018-12-28 ENCOUNTER — Ambulatory Visit
Admission: RE | Admit: 2018-12-28 | Discharge: 2018-12-28 | Disposition: A | Discharge status: Home / Self Care | Payer: Medicaid Other | Source: Ambulatory Visit | Attending: Radiation Oncology | Admitting: Radiation Oncology

## 2018-12-28 ENCOUNTER — Inpatient Hospital Stay: Payer: Medicaid Other

## 2018-12-28 ENCOUNTER — Inpatient Hospital Stay (HOSPITAL_BASED_OUTPATIENT_CLINIC_OR_DEPARTMENT_OTHER): Payer: Medicaid Other | Admitting: Hematology

## 2018-12-28 ENCOUNTER — Ambulatory Visit: Payer: Medicaid Other | Admitting: Radiation Oncology

## 2018-12-28 ENCOUNTER — Inpatient Hospital Stay: Payer: Medicaid Other | Attending: Nurse Practitioner

## 2018-12-28 ENCOUNTER — Ambulatory Visit: Payer: Medicaid Other

## 2018-12-28 ENCOUNTER — Other Ambulatory Visit: Payer: Self-pay

## 2018-12-28 ENCOUNTER — Telehealth: Payer: Self-pay

## 2018-12-28 ENCOUNTER — Encounter: Payer: Self-pay | Admitting: Hematology

## 2018-12-28 ENCOUNTER — Inpatient Hospital Stay: Payer: Medicaid Other | Admitting: Hematology

## 2018-12-28 VITALS — BP 162/105 | HR 109 | Temp 98.6°F | Resp 17 | Ht 66.0 in | Wt 129.4 lb

## 2018-12-28 DIAGNOSIS — C50112 Malignant neoplasm of central portion of left female breast: Secondary | ICD-10-CM

## 2018-12-28 DIAGNOSIS — G620 Drug-induced polyneuropathy: Secondary | ICD-10-CM | POA: Diagnosis not present

## 2018-12-28 DIAGNOSIS — Z171 Estrogen receptor negative status [ER-]: Secondary | ICD-10-CM | POA: Insufficient documentation

## 2018-12-28 DIAGNOSIS — D6481 Anemia due to antineoplastic chemotherapy: Secondary | ICD-10-CM | POA: Diagnosis not present

## 2018-12-28 DIAGNOSIS — Z510 Encounter for antineoplastic radiation therapy: Secondary | ICD-10-CM | POA: Diagnosis not present

## 2018-12-28 DIAGNOSIS — Z5111 Encounter for antineoplastic chemotherapy: Secondary | ICD-10-CM | POA: Diagnosis present

## 2018-12-28 DIAGNOSIS — R739 Hyperglycemia, unspecified: Secondary | ICD-10-CM | POA: Insufficient documentation

## 2018-12-28 DIAGNOSIS — J449 Chronic obstructive pulmonary disease, unspecified: Secondary | ICD-10-CM | POA: Diagnosis not present

## 2018-12-28 DIAGNOSIS — I10 Essential (primary) hypertension: Secondary | ICD-10-CM | POA: Diagnosis not present

## 2018-12-28 LAB — CBC WITH DIFFERENTIAL (CANCER CENTER ONLY)
Abs Immature Granulocytes: 0.02 10*3/uL (ref 0.00–0.07)
Basophils Absolute: 0.1 10*3/uL (ref 0.0–0.1)
Basophils Relative: 1 %
Eosinophils Absolute: 0.2 10*3/uL (ref 0.0–0.5)
Eosinophils Relative: 3 %
HCT: 37.6 % (ref 36.0–46.0)
Hemoglobin: 12 g/dL (ref 12.0–15.0)
Immature Granulocytes: 0 %
Lymphocytes Relative: 21 %
Lymphs Abs: 1.5 10*3/uL (ref 0.7–4.0)
MCH: 30.9 pg (ref 26.0–34.0)
MCHC: 31.9 g/dL (ref 30.0–36.0)
MCV: 96.9 fL (ref 80.0–100.0)
Monocytes Absolute: 0.8 10*3/uL (ref 0.1–1.0)
Monocytes Relative: 11 %
Neutro Abs: 4.6 10*3/uL (ref 1.7–7.7)
Neutrophils Relative %: 64 %
Platelet Count: 229 10*3/uL (ref 150–400)
RBC: 3.88 MIL/uL (ref 3.87–5.11)
RDW: 14.3 % (ref 11.5–15.5)
WBC Count: 7.3 10*3/uL (ref 4.0–10.5)
nRBC: 0 % (ref 0.0–0.2)

## 2018-12-28 LAB — CMP (CANCER CENTER ONLY)
ALT: 12 U/L (ref 0–44)
AST: 16 U/L (ref 15–41)
Albumin: 3.9 g/dL (ref 3.5–5.0)
Alkaline Phosphatase: 74 U/L (ref 38–126)
Anion gap: 10 (ref 5–15)
BUN: 14 mg/dL (ref 8–23)
CO2: 27 mmol/L (ref 22–32)
Calcium: 10.7 mg/dL — ABNORMAL HIGH (ref 8.9–10.3)
Chloride: 103 mmol/L (ref 98–111)
Creatinine: 0.91 mg/dL (ref 0.44–1.00)
GFR, Est AFR Am: >60 mL/min (ref >60)
GFR, Estimated: >60 mL/min (ref >60)
Glucose, Bld: 90 mg/dL (ref 70–99)
Potassium: 4 mmol/L (ref 3.5–5.1)
Sodium: 140 mmol/L (ref 135–145)
Total Bilirubin: 0.3 mg/dL (ref 0.3–1.2)
Total Protein: 7.3 g/dL (ref 6.5–8.1)

## 2018-12-28 MED ORDER — LIDOCAINE-PRILOCAINE 2.5-2.5 % EX CREA: TOPICAL_CREAM | CUTANEOUS | 3 refills | Status: DC

## 2018-12-28 NOTE — Progress Notes (Addendum)
[place]   [phone] Fax:(336) [phone]   Clinic Follow up Note   Patient Care Team: [facility] as PCP - General Jennifer Keens, MD as Consulting Physician (General Surgery) Jennifer Merle, MD as Consulting Physician (Hematology)  Date of Service:  12/28/2018   CHIEF COMPLAINT: F/u on left breast cancer   SUMMARY OF ONCOLOGIC HISTORY:   Cancer of central portion of left female breast ([place])   04/28/2018 Cancer Staging    Staging form: Breast, AJCC 8th Edition - Clinical stage from 04/28/2018: Stage IB (cT1c, cN0, cM0, G3, ER-, PR-, HER2-) - Signed by Jennifer Merle, MD on 05/24/2018    05/24/2018 Initial Diagnosis    Cancer of central portion of left female breast ([place])    06/02/2018 Cancer Staging    Staging form: Breast, AJCC 8th Edition - Pathologic stage from 06/02/2018: Stage IIA (pT2, pN0, cM0, G3, ER-, PR-, HER2-) - Signed by Jennifer Merle, MD on 06/23/2018    06/07/2018 Surgery    LEFT BREAST PARTIAL MASTECTOMY WITH AXILLARY SENTINEL LYMPH NODE BIOPSY and PAC placement by Dr. Ninfa Johns  06/07/18    06/07/2018 Pathology Results    Diagnosis 06/07/18 1. Breast, partial mastectomy, Left - INVASIVE DUCTAL CARCINOMA, GRADE III, 2.1 CM 1 of 4 FINAL for Jennifer, ERXLEBEN Johns [phone]) Diagnosis(continued) - SURGICAL RESECTION MARGINS ARE NEGATIVE FOR CARCINOMA. - NEGATIVE FOR LYMPHOVASCULAR OR PERINEURAL INVASION. - BIOPSY SITE CHANGES. - SEE ONCOLOGY TABLE. - SEE NOTE. 2. Lymph node, sentinel, biopsy, Left Axillary - LYMPH NODE, NEGATIVE FOR CARCINOMA (0/1). 3. Lymph node, sentinel, biopsy, Left - LYMPH NODE, NEGATIVE FOR CARCINOMA (0/1). 4. Lymph node, sentinel, biopsy, Left - LYMPH NODE, NEGATIVE FOR CARCINOMA (0/1). 5. Lymph node, sentinel, biopsy, Left - LYMPH NODE, NEGATIVE FOR CARCINOMA (0/1). 6. Lymph node, sentinel, biopsy, Left - LYMPH NODE, NEGATIVE FOR CARCINOMA (0/1). 7. Lymph node, sentinel, biopsy, Left - LYMPH NODE,  NEGATIVE FOR CARCINOMA (0/1). The tumor cells are Negative for Her2 (0). Estrogen Receptor: 0%, NEGATIVE Progesterone Receptor: 0%, NEGATIVE Proliferation Marker Ki67: 40%    06/08/2018 Breast MRI    MRI Breast B/l 05/29/18 IMPRESSION: Solitary mass within the UPPER central portion of the LEFT breast, 2.3 Centimeters in maximum diameter. No MRI evidence for adenopathy. RIGHT breast is negative. LEFT BREAST PARTIAL MASTECTOMY WITH AXILLARY SENTINEL LYMPH NODE BIOPSY with PAC placement by Dr. Ninfa Johns 06/07/18    07/11/2018 Imaging    Whole Body Bone Scan 07/11/18  IMPRESSION: No scintigraphic evidence of osseous metastatic disease.    07/13/2018 - 11/30/2018 Chemotherapy    Adjuvant AC every 2 weeks for 4 cycles, 07/13/18-08/24/18. Followed by Taxol every 2 weeks starting 09/11/18-11/30/18    08/20/2018 Imaging    CT AP W Contrast 08/20/18  IMPRESSION: No acute findings in the abdomen/pelvis. Mild prominence of the main pancreatic duct measuring 6 mm. No evidence of mass, adenopathy or ductal stones. Subtle focal nodularity over the posterior left lower lobe which may be acute or chronic and may be due to an infectious versus atypical infectious or inflammatory process. Recommend follow-up chest CT 4-6 weeks. Two subcentimeter liver hypodensities too small to characterize but likely cysts. Aortic Atherosclerosis (ICD10-I70.0).      CURRENT THERAPY:  Pending adjuvant radiation   INTERVAL HISTORY:  Jennifer Johns is here for a follow up of left breast cancer. She notes she is doing well overall. She has residual neuropathy in her feet, R>L. She has not issues with her fingers. She still  has nail discoloration from chemo as well. She has not had a flush this month, she rather wait until next month. She would like to have her PAC removed.  She notes she has been taking her HTN medication. She denies HA and chest pain.     REVIEW OF SYSTEMS:   Constitutional: Denies fevers, chills  or abnormal weight loss Eyes: Denies blurriness of vision Ears, nose, mouth, throat, and face: Denies mucositis or sore throat Respiratory: Denies cough, dyspnea or wheezes Cardiovascular: Denies palpitation, chest discomfort or lower extremity swelling Gastrointestinal:  Denies nausea, heartburn or change in bowel habits Skin: Denies abnormal skin rashes Lymphatics: Denies new lymphadenopathy or easy bruising Neurological:Denies new weaknesses (+) Residual neuropathy in feet, R>L Behavioral/Psych: Mood is stable, no new changes  All other systems were reviewed with the patient and are negative.  MEDICAL HISTORY:  Past Medical History:  Diagnosis Date   Asthma    Breast cancer ([place])    left breast   Cancer ([place])    Left Breast   COPD (chronic obstructive pulmonary disease) ([place])    Hypertension     SURGICAL HISTORY: Past Surgical History:  Procedure Laterality Date   COLONOSCOPY N/A 03/18/2016   Procedure: COLONOSCOPY;  Surgeon: Jennifer Binder, MD;  [facility];  Service: Endoscopy;  Laterality: N/A;  10:30 AM   PARTIAL MASTECTOMY WITH AXILLARY SENTINEL LYMPH NODE BIOPSY Left 06/07/2018   Procedure: LEFT BREAST PARTIAL MASTECTOMY WITH AXILLARY SENTINEL LYMPH NODE BIOPSY;  Surgeon: Jennifer Keens, MD;  Location: [facility];  Service: General;  Laterality: Left;   PORTACATH PLACEMENT N/A 06/07/2018   Procedure: INSERTION PORT-A-CATH;  Surgeon: Jennifer Keens, MD;  Location: [facility];  Service: General;  Laterality: N/A;    I have reviewed the social history and family history with the patient and they are unchanged from previous note.  ALLERGIES:  is allergic to carrot [daucus carota] and other.  MEDICATIONS:  Current Outpatient Medications  Medication Sig Dispense Refill   albuterol (ACCUNEB) 1.25 MG/3ML nebulizer solution Take 1 ampule by nebulization every 6 (six) hours as needed for wheezing or shortness of breath.      albuterol (PROVENTIL HFA;VENTOLIN  HFA) 108 (90 Base) MCG/ACT inhaler Inhale 2 puffs into the lungs every 6 (six) hours as needed for wheezing or shortness of breath.      cholecalciferol (VITAMIN D) 1000 units tablet Take 1,000 Units by mouth daily.   1   gabapentin (NEURONTIN) 300 MG capsule Take 1 capsule (300 mg total) by mouth at bedtime. 30 capsule 3   lidocaine-prilocaine (EMLA) cream Apply to affected area once 30 g 3   lisinopril (PRINIVIL,ZESTRIL) 10 MG tablet Take 10 mg by mouth daily.     loperamide (IMODIUM) 2 MG capsule Take 2 tabs with first watery stool then 1 tab after each subsequent watery stool. Do not exceed 8 tabs per day 40 capsule 1   ondansetron (ZOFRAN) 8 MG tablet Take 1 tablet (8 mg total) by mouth 2 (two) times daily as needed. Start on the third day after chemotherapy. 30 tablet 1   prochlorperazine (COMPAZINE) 10 MG tablet Take 1 tablet (10 mg total) by mouth every 6 (six) hours as needed (Nausea or vomiting). 30 tablet 1   ranitidine (ZANTAC) 150 MG tablet Take 150 mg by mouth daily.   1   No current facility-administered medications for this visit.     PHYSICAL EXAMINATION: ECOG PERFORMANCE STATUS: 1 - Symptomatic but completely ambulatory   Vitals:  12/28/18 1354  BP: (!) 162/105  Pulse: (!) 109  Resp: 17  Temp: 98.6 F (37 C)  SpO2: 96%   Filed Weights   12/28/18 1354  Weight: 129 lb 6.4 oz (58.7 kg)     GENERAL:alert, no distress and comfortable SKIN: skin color, texture, turgor are normal, no rashes or significant lesions EYES: normal, Conjunctiva are pink and non-injected, sclera clear OROPHARYNX:no exudate, no erythema and lips, buccal mucosa, and tongue normal  NECK: supple, thyroid normal size, non-tender, without nodularity LYMPH:  no palpable lymphadenopathy in the cervical, axillary or inguinal LUNGS: clear to auscultation and percussion with normal breathing effort HEART: regular rate & rhythm and no murmurs and no lower extremity edema ABDOMEN:abdomen soft,  non-tender and normal bowel sounds Musculoskeletal:no cyanosis of digits and no clubbing  NEURO: alert & oriented x 3 with fluent speech, no focal motor/sensory deficits  LABORATORY DATA:  I have reviewed the data as listed CBC Latest Ref Rng & Units 12/28/2018 11/30/2018 11/23/2018  WBC 4.0 - 10.5 K/uL 7.3 2.9(L) 3.2(L)  Hemoglobin 12.0 - 15.0 g/dL 12.0 8.8(L) 9.0(L)  Hematocrit 36.0 - 46.0 % 37.6 28.0(L) 28.8(L)  Platelets 150 - 400 K/uL 229 247 246     CMP Latest Ref Rng & Units 12/28/2018 11/30/2018 11/23/2018  Glucose 70 - 99 mg/dL 90 103(H) 101(H)  BUN 8 - 23 mg/dL 14 11 15   Creatinine 0.44 - 1.00 mg/dL 0.91 1.15(H) 0.93  Sodium 135 - 145 mmol/L 140 140 139  Potassium 3.5 - 5.1 mmol/L 4.0 3.8 3.7  Chloride 98 - 111 mmol/L 103 106 106  CO2 22 - 32 mmol/L 27 25 29   Calcium 8.9 - 10.3 mg/dL 10.7(H) 9.9 10.0  Total Protein 6.5 - 8.1 g/dL 7.3 6.5 6.9  Total Bilirubin 0.3 - 1.2 mg/dL 0.3 0.3 0.3  Alkaline Phos 38 - 126 U/L 74 76 56  AST 15 - 41 U/L 16 12(L) 16  ALT 0 - 44 U/L 12 9 11       RADIOGRAPHIC STUDIES: I have personally reviewed the radiological images as listed and agreed with the findings in the report. No results found.   ASSESSMENT & PLAN:  Jennifer Johns is a 63 y.o. female with   1. 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 Treated with lumpectomy and adjuvant chemo with AC-T.  -She will proceed with Adjuvant radiation with Dr. Lisbeth Renshaw this month.  -She is clinically doing well. She still has residual mild neuropathy of feet, R>L. Labs reviewed, CBC and CMP WNL except Ca at 10.7, still has moderte anemia, I recommend she start prenatal vitamin. There is clinical concern for recurrence.  -Given Hypercalcemia, I instructed her to stop Oral calcium.  -given triple negative disease, no need adjuvant antiestrogen therapy  -we reviewed risk of cancer recurrent and surveillance  plan, continue annual mammogram, self exam, and routine f/u for 5 years   -F/u in 4 months with survivorship clinic   2.Anemia, secondary to chemo -she was not anemic before chemo -Anemia currently resolved with Hg 12.0 (12/28/18). I recommend her continue MVI   3. Peripheral neuropathy, secondary to chemo, G1 -She is on gabapentin 300 mg p.o. nightly, but not very compliant  -Also encouraged her to take B complex -Residually in feet, R>L.   4. COPD, HTN -f/u with PCP and continue medications -I have previously advised her to monitor her BP at home and decrease salt intake  -BP  at 162/105 today (12/28/18). repeat BP in clinic 146/90. -I instructed her to reduce salt intake and advised her to follow up with her PCP. She is agreeable.  -I encouraged her to have [place] monitor during her treatment.   5. Hyperglycemia  -No known history of DM. Likely related to steroids. -I previously reduced her premedication steroids to 5 mg.  -Currently resolved, BG at 90 today (12/28/18)   Plan -She is clinically doing well  -She will stop oral calcium  -Proceed with Radiation  -lab and f/u in 3 month with survivorship clinic  -continue port flush    No problem-specific Assessment & Plan notes found for this encounter.   No orders of the defined types were placed in this encounter.  All questions were answered. The patient knows to call the clinic with any problems, questions or concerns. No barriers to learning was detected. I spent 15 minutes counseling the patient face to face. The total time spent in the appointment was 20 minutes and more than 50% was on counseling and review of test results     Jennifer Merle, MD 12/28/2018   I, Joslyn Devon, am acting as scribe for Jennifer Merle, MD.   I have reviewed the above documentation for accuracy and completeness, and I agree with the above.

## 2018-12-28 NOTE — Telephone Encounter (Signed)
Spoke with patient's daughter concerning her appointments.  Changing to lab at 1:30 f/u with Dr. Burr Medico at 2:00, RT at 2:30.  She verbalized an understanding and was very appreciative of the call.

## 2018-12-29 ENCOUNTER — Telehealth: Payer: Self-pay | Admitting: Hematology

## 2018-12-29 NOTE — Telephone Encounter (Signed)
Scheduled appt per 4/9 los. °

## 2019-01-01 ENCOUNTER — Telehealth: Payer: Self-pay | Admitting: Hematology

## 2019-01-01 NOTE — Telephone Encounter (Signed)
Scheduled appts per 4/9 sch msg. Rescheduled survivorship. Mailed printout

## 2019-01-09 DIAGNOSIS — Z510 Encounter for antineoplastic radiation therapy: Secondary | ICD-10-CM | POA: Diagnosis not present

## 2019-01-15 ENCOUNTER — Other Ambulatory Visit: Payer: Self-pay

## 2019-01-15 ENCOUNTER — Ambulatory Visit
Admission: RE | Admit: 2019-01-15 | Discharge: 2019-01-15 | Disposition: A | Discharge status: Home / Self Care | Payer: Medicaid Other | Source: Ambulatory Visit | Attending: Radiation Oncology | Admitting: Radiation Oncology

## 2019-01-15 DIAGNOSIS — Z510 Encounter for antineoplastic radiation therapy: Secondary | ICD-10-CM | POA: Diagnosis not present

## 2019-01-16 ENCOUNTER — Other Ambulatory Visit: Payer: Self-pay

## 2019-01-16 ENCOUNTER — Ambulatory Visit
Admission: RE | Admit: 2019-01-16 | Discharge: 2019-01-16 | Disposition: A | Discharge status: Home / Self Care | Payer: Medicaid Other | Source: Ambulatory Visit | Attending: Radiation Oncology | Admitting: Radiation Oncology

## 2019-01-16 DIAGNOSIS — Z510 Encounter for antineoplastic radiation therapy: Secondary | ICD-10-CM | POA: Diagnosis not present

## 2019-01-17 ENCOUNTER — Ambulatory Visit
Admission: RE | Admit: 2019-01-17 | Discharge: 2019-01-17 | Disposition: A | Discharge status: Home / Self Care | Payer: Medicaid Other | Source: Ambulatory Visit | Attending: Radiation Oncology | Admitting: Radiation Oncology

## 2019-01-17 ENCOUNTER — Other Ambulatory Visit: Payer: Self-pay

## 2019-01-17 DIAGNOSIS — Z510 Encounter for antineoplastic radiation therapy: Secondary | ICD-10-CM | POA: Diagnosis not present

## 2019-01-18 ENCOUNTER — Other Ambulatory Visit: Payer: Self-pay

## 2019-01-18 ENCOUNTER — Ambulatory Visit
Admission: RE | Admit: 2019-01-18 | Discharge: 2019-01-18 | Disposition: A | Discharge status: Home / Self Care | Payer: Medicaid Other | Source: Ambulatory Visit | Attending: Radiation Oncology | Admitting: Radiation Oncology

## 2019-01-18 DIAGNOSIS — Z510 Encounter for antineoplastic radiation therapy: Secondary | ICD-10-CM | POA: Diagnosis not present

## 2019-01-18 NOTE — Progress Notes (Signed)
  Radiation Oncology         [phone] ________________________________  Name: SHANYCE DARIS MRN: [id number]  Date: 12/28/2018  DOB: 24-Jan-1956   DIAGNOSIS:     ICD-10-CM   1. Malignant neoplasm of central portion of left breast in female, estrogen receptor negative ([place]) C50.112    Z17.1     SIMULATION AND TREATMENT PLANNING NOTE  The patient presented for simulation prior to beginning her course of radiation treatment for her diagnosis of left-sided breast cancer. The patient was placed in a supine position on a breast board. A customized vac-lock bag was constructed and this complex treatment device will be used on a daily basis during her treatment. In this fashion, a CT scan was obtained through the chest area and an isocenter was placed near the chest wall within the breast.  The patient will be planned to receive a course of radiation initially to a dose of 42.56 Gy. This will consist of a whole breast radiotherapy technique. To accomplish this, 2 customized blocks have been designed which will correspond to medial and lateral whole breast tangent fields. This treatment will be accomplished at 2.66 Gy per fraction. A forward planning technique will also be evaluated to determine if this approach improves the plan. It is anticipated that the patient will then receive a 8 Gy boost to the seroma cavity which has been contoured. This will be accomplished at 2 Gy per fraction.   This initial treatment will consist of a 3-D conformal technique. The seroma has been contoured as the primary target structure. Additionally, dose volume histograms of both this target as well as the lungs and heart will also be evaluated. Such an approach is necessary to ensure that the target area is adequately covered while the nearby critical  normal structures are adequately spared.  Plan:  The final anticipated total dose therefore will correspond to 50.56 Gy.     Special treatment procedure was  performed today due to the extra time and effort required by myself to plan and prepare this patient for deep inspiration breath hold technique.  I have determined cardiac sparing to be of benefit to this patient to prevent long term cardiac damage due to radiation of the heart.  Bellows were placed on the patient's abdomen. To facilitate cardiac sparing, the patient was coached by the radiation therapists on breath hold techniques and breathing practice was performed. Practice waveforms were obtained. The patient was then scanned while maintaining breath hold in the treatment position.  This image was then transferred over to the imaging specialist. The imaging specialist then created a fusion of the free breathing and breath hold scans using the chest wall as the stable structure. I personally reviewed the fusion in axial, coronal and sagittal image planes.  Excellent cardiac sparing was obtained.  I felt the patient is an appropriate candidate for breath hold and the patient will be treated as such.  The image fusion was then reviewed with the patient to reinforce the necessity of reproducible breath hold.   _______________________________   Jodelle Gross, MD, PhD

## 2019-01-18 NOTE — Progress Notes (Signed)
  Radiation Oncology         [phone] ________________________________  Name: SHELL BLANCHETTE MRN: [id number]  Date: 12/28/2018  DOB: 25-Sep-1955  Optical Surface Tracking Plan:  Since intensity modulated radiotherapy (IMRT) and 3D conformal radiation treatment methods are predicated on accurate and precise positioning for treatment, intrafraction motion monitoring is medically necessary to ensure accurate and safe treatment delivery.  The ability to quantify intrafraction motion without excessive ionizing radiation dose can only be performed with optical surface tracking. Accordingly, surface imaging offers the opportunity to obtain 3D measurements of patient position throughout IMRT and 3D treatments without excessive radiation exposure.  I am ordering optical surface tracking for this patient's upcoming course of radiotherapy. ________________________________  Kyung Rudd, MD 01/18/2019 1:35 PM    Reference:   Ursula Alert, J, et al. Surface imaging-based analysis of intrafraction motion for breast radiotherapy patients.Journal of [place], n. 6, nov. 2014. ISSN [id number].   Available at: <http://www.jacmp.org/index.php/jacmp/article/view/4957>.

## 2019-01-19 ENCOUNTER — Ambulatory Visit
Admission: RE | Admit: 2019-01-19 | Discharge: 2019-01-19 | Disposition: A | Discharge status: Home / Self Care | Payer: Medicaid Other | Source: Ambulatory Visit | Attending: Radiation Oncology | Admitting: Radiation Oncology

## 2019-01-19 ENCOUNTER — Other Ambulatory Visit: Payer: Self-pay

## 2019-01-19 DIAGNOSIS — C50112 Malignant neoplasm of central portion of left female breast: Secondary | ICD-10-CM | POA: Insufficient documentation

## 2019-01-19 DIAGNOSIS — Z171 Estrogen receptor negative status [ER-]: Secondary | ICD-10-CM | POA: Diagnosis not present

## 2019-01-19 DIAGNOSIS — Z510 Encounter for antineoplastic radiation therapy: Secondary | ICD-10-CM | POA: Insufficient documentation

## 2019-01-19 MED ORDER — RADIAPLEXRX EX GEL
Freq: Once | CUTANEOUS | Status: AC
  Administered 2019-01-19: 11:00:00 via TOPICAL

## 2019-01-19 MED ORDER — ALRA NON-METALLIC DEODORANT (RAD-ONC)
1.0000 "application " | Freq: Once | TOPICAL | Status: AC
  Administered 2019-01-19: 1 via TOPICAL

## 2019-01-19 NOTE — Progress Notes (Signed)
Pt here for patient teaching.  Pt given Radiation and You booklet, skin care instructions, Alra deodorant and Radiaplex gel.  Reviewed areas of pertinence such as fatigue, hair loss, skin changes, breast tenderness and breast swelling . Pt able to give teach back of to pat skin and use unscented/gentle soap,apply Radiaplex bid, avoid applying anything to skin within 4 hours of treatment, avoid wearing an under wire bra and to use an electric razor if they must shave. Pt verbalized understanding of information given and will contact nursing with any questions or concerns.     Charo Philipp M. Omarion Minnehan RN, BSN  

## 2019-01-22 ENCOUNTER — Ambulatory Visit
Admission: RE | Admit: 2019-01-22 | Discharge: 2019-01-22 | Disposition: A | Discharge status: Home / Self Care | Payer: Medicaid Other | Source: Ambulatory Visit | Attending: Radiation Oncology | Admitting: Radiation Oncology

## 2019-01-22 ENCOUNTER — Other Ambulatory Visit: Payer: Self-pay

## 2019-01-22 DIAGNOSIS — Z510 Encounter for antineoplastic radiation therapy: Secondary | ICD-10-CM | POA: Diagnosis not present

## 2019-01-23 ENCOUNTER — Ambulatory Visit
Admission: RE | Admit: 2019-01-23 | Discharge: 2019-01-23 | Disposition: A | Discharge status: Home / Self Care | Payer: Medicaid Other | Source: Ambulatory Visit | Attending: Radiation Oncology | Admitting: Radiation Oncology

## 2019-01-23 ENCOUNTER — Other Ambulatory Visit: Payer: Self-pay

## 2019-01-23 DIAGNOSIS — Z510 Encounter for antineoplastic radiation therapy: Secondary | ICD-10-CM | POA: Diagnosis not present

## 2019-01-24 ENCOUNTER — Ambulatory Visit
Admission: RE | Admit: 2019-01-24 | Discharge: 2019-01-24 | Disposition: A | Discharge status: Home / Self Care | Payer: Medicaid Other | Source: Ambulatory Visit | Attending: Radiation Oncology | Admitting: Radiation Oncology

## 2019-01-24 ENCOUNTER — Other Ambulatory Visit: Payer: Self-pay

## 2019-01-24 DIAGNOSIS — Z510 Encounter for antineoplastic radiation therapy: Secondary | ICD-10-CM | POA: Diagnosis not present

## 2019-01-25 ENCOUNTER — Other Ambulatory Visit: Payer: Self-pay

## 2019-01-25 ENCOUNTER — Ambulatory Visit
Admission: RE | Admit: 2019-01-25 | Discharge: 2019-01-25 | Disposition: A | Discharge status: Home / Self Care | Payer: Medicaid Other | Source: Ambulatory Visit | Attending: Radiation Oncology | Admitting: Radiation Oncology

## 2019-01-25 DIAGNOSIS — Z510 Encounter for antineoplastic radiation therapy: Secondary | ICD-10-CM | POA: Diagnosis not present

## 2019-01-26 ENCOUNTER — Other Ambulatory Visit: Payer: Self-pay

## 2019-01-26 ENCOUNTER — Ambulatory Visit
Admission: RE | Admit: 2019-01-26 | Discharge: 2019-01-26 | Disposition: A | Discharge status: Home / Self Care | Payer: Medicaid Other | Source: Ambulatory Visit | Attending: Radiation Oncology | Admitting: Radiation Oncology

## 2019-01-26 DIAGNOSIS — Z510 Encounter for antineoplastic radiation therapy: Secondary | ICD-10-CM | POA: Diagnosis not present

## 2019-01-29 ENCOUNTER — Inpatient Hospital Stay: Payer: Medicaid Other | Attending: Nurse Practitioner

## 2019-01-29 ENCOUNTER — Ambulatory Visit
Admission: RE | Admit: 2019-01-29 | Discharge: 2019-01-29 | Disposition: A | Discharge status: Home / Self Care | Payer: Medicaid Other | Source: Ambulatory Visit | Attending: Radiation Oncology | Admitting: Radiation Oncology

## 2019-01-29 ENCOUNTER — Other Ambulatory Visit: Payer: Self-pay

## 2019-01-29 DIAGNOSIS — Z452 Encounter for adjustment and management of vascular access device: Secondary | ICD-10-CM | POA: Diagnosis present

## 2019-01-29 DIAGNOSIS — C50112 Malignant neoplasm of central portion of left female breast: Secondary | ICD-10-CM | POA: Insufficient documentation

## 2019-01-29 DIAGNOSIS — Z95828 Presence of other vascular implants and grafts: Secondary | ICD-10-CM

## 2019-01-29 DIAGNOSIS — Z510 Encounter for antineoplastic radiation therapy: Secondary | ICD-10-CM | POA: Diagnosis not present

## 2019-01-29 MED ORDER — HEPARIN SOD (PORK) LOCK FLUSH 100 UNIT/ML IV SOLN
500.0000 [IU] | Freq: Once | INTRAVENOUS | Status: AC | PRN
  Administered 2019-01-29: 500 [IU]
  Filled 2019-01-29: qty 5

## 2019-01-29 MED ORDER — SODIUM CHLORIDE 0.9% FLUSH
10.0000 mL | INTRAVENOUS | Status: DC | PRN
  Administered 2019-01-29: 10 mL
  Filled 2019-01-29: qty 10

## 2019-01-30 ENCOUNTER — Other Ambulatory Visit: Payer: Self-pay

## 2019-01-30 ENCOUNTER — Ambulatory Visit
Admission: RE | Admit: 2019-01-30 | Discharge: 2019-01-30 | Disposition: A | Discharge status: Home / Self Care | Payer: Medicaid Other | Source: Ambulatory Visit | Attending: Radiation Oncology | Admitting: Radiation Oncology

## 2019-01-30 DIAGNOSIS — Z510 Encounter for antineoplastic radiation therapy: Secondary | ICD-10-CM | POA: Diagnosis not present

## 2019-01-31 ENCOUNTER — Other Ambulatory Visit: Payer: Self-pay

## 2019-01-31 ENCOUNTER — Ambulatory Visit
Admission: RE | Admit: 2019-01-31 | Discharge: 2019-01-31 | Disposition: A | Discharge status: Home / Self Care | Payer: Medicaid Other | Source: Ambulatory Visit | Attending: Radiation Oncology | Admitting: Radiation Oncology

## 2019-01-31 DIAGNOSIS — Z510 Encounter for antineoplastic radiation therapy: Secondary | ICD-10-CM | POA: Diagnosis not present

## 2019-02-01 ENCOUNTER — Other Ambulatory Visit: Payer: Self-pay

## 2019-02-01 ENCOUNTER — Ambulatory Visit
Admission: RE | Admit: 2019-02-01 | Discharge: 2019-02-01 | Disposition: A | Discharge status: Home / Self Care | Payer: Medicaid Other | Source: Ambulatory Visit | Attending: Radiation Oncology | Admitting: Radiation Oncology

## 2019-02-01 DIAGNOSIS — Z510 Encounter for antineoplastic radiation therapy: Secondary | ICD-10-CM | POA: Diagnosis not present

## 2019-02-02 ENCOUNTER — Ambulatory Visit
Admission: RE | Admit: 2019-02-02 | Discharge: 2019-02-02 | Disposition: A | Discharge status: Home / Self Care | Payer: Medicaid Other | Source: Ambulatory Visit | Attending: Radiation Oncology | Admitting: Radiation Oncology

## 2019-02-02 ENCOUNTER — Other Ambulatory Visit: Payer: Self-pay

## 2019-02-02 DIAGNOSIS — Z510 Encounter for antineoplastic radiation therapy: Secondary | ICD-10-CM | POA: Diagnosis not present

## 2019-02-05 ENCOUNTER — Ambulatory Visit
Admission: RE | Admit: 2019-02-05 | Discharge: 2019-02-05 | Disposition: A | Discharge status: Home / Self Care | Payer: Medicaid Other | Source: Ambulatory Visit | Attending: Radiation Oncology | Admitting: Radiation Oncology

## 2019-02-05 ENCOUNTER — Other Ambulatory Visit: Payer: Self-pay

## 2019-02-05 DIAGNOSIS — Z510 Encounter for antineoplastic radiation therapy: Secondary | ICD-10-CM | POA: Diagnosis not present

## 2019-02-06 ENCOUNTER — Ambulatory Visit
Admission: RE | Admit: 2019-02-06 | Discharge: 2019-02-06 | Disposition: A | Discharge status: Home / Self Care | Payer: Medicaid Other | Source: Ambulatory Visit | Attending: Radiation Oncology | Admitting: Radiation Oncology

## 2019-02-06 ENCOUNTER — Other Ambulatory Visit: Payer: Self-pay

## 2019-02-06 DIAGNOSIS — Z510 Encounter for antineoplastic radiation therapy: Secondary | ICD-10-CM | POA: Diagnosis not present

## 2019-02-07 ENCOUNTER — Other Ambulatory Visit: Payer: Self-pay

## 2019-02-07 ENCOUNTER — Ambulatory Visit
Admission: RE | Admit: 2019-02-07 | Discharge: 2019-02-07 | Disposition: A | Discharge status: Home / Self Care | Payer: Medicaid Other | Source: Ambulatory Visit | Attending: Radiation Oncology | Admitting: Radiation Oncology

## 2019-02-07 DIAGNOSIS — Z510 Encounter for antineoplastic radiation therapy: Secondary | ICD-10-CM | POA: Diagnosis not present

## 2019-02-08 ENCOUNTER — Other Ambulatory Visit: Payer: Self-pay

## 2019-02-08 ENCOUNTER — Ambulatory Visit
Admission: RE | Admit: 2019-02-08 | Discharge: 2019-02-08 | Disposition: A | Discharge status: Home / Self Care | Payer: Medicaid Other | Source: Ambulatory Visit | Attending: Radiation Oncology | Admitting: Radiation Oncology

## 2019-02-08 ENCOUNTER — Encounter: Payer: Self-pay | Admitting: *Deleted

## 2019-02-08 DIAGNOSIS — Z510 Encounter for antineoplastic radiation therapy: Secondary | ICD-10-CM | POA: Diagnosis not present

## 2019-02-09 ENCOUNTER — Other Ambulatory Visit: Payer: Self-pay

## 2019-02-09 ENCOUNTER — Encounter: Payer: Self-pay | Admitting: Radiation Oncology

## 2019-02-09 ENCOUNTER — Ambulatory Visit
Admission: RE | Admit: 2019-02-09 | Discharge: 2019-02-09 | Disposition: A | Discharge status: Home / Self Care | Payer: Medicaid Other | Source: Ambulatory Visit | Attending: Radiation Oncology | Admitting: Radiation Oncology

## 2019-02-09 DIAGNOSIS — Z510 Encounter for antineoplastic radiation therapy: Secondary | ICD-10-CM | POA: Diagnosis not present

## 2019-03-13 ENCOUNTER — Encounter: Payer: Self-pay | Admitting: Gastroenterology

## 2019-03-19 ENCOUNTER — Telehealth: Payer: Self-pay | Admitting: Radiation Oncology

## 2019-03-19 NOTE — Telephone Encounter (Signed)
  Radiation Oncology         [phone]) [phone] ________________________________  Name: Jennifer Johns MRN: [id number]  Date of Service: 03/19/2019  DOB: [birth year]  Post Treatment Telephone Note  Diagnosis:   Stage IIA, pT2N0M0, grade 3 triple negative invasive ductal carcinoma of the left breast.  Interval Since Last Radiation:  6 weeks   01/15/2019-02/09/2019:  The left breast was treated to 50.56 Gy in 16 fractions followed by a 8 Gy boost in 4 fractions.  Narrative:  The patient was contacted today for routine follow-up. During treatment she did very well with radiotherapy and did not have significant desquamation.   Impression/Plan: 1. Stage IIA, pT2N0M0, grade 3 triple negative invasive ductal carcinoma of the left breast. I had to leave a VM letting her know that I wanted to check in on her. I discussed that we would be happy to continue to follow her as needed, but she will also continue to follow up with Dr. Burr Medico in medical oncology. I encouraged her to call us back to discuss post treatment skin care.    Carola Rhine, PAC

## 2019-03-21 NOTE — Progress Notes (Signed)
CLINIC:  Survivorship   REASON FOR VISIT:  Routine follow-up post-treatment for a recent history of breast cancer.  Patient Care Team: [facility] as PCP - General Coralie Keens, MD as Consulting Physician (General Surgery) Truitt Merle, MD as Consulting Physician (Hematology) Mauro Kaufmann, RN as Oncology Nurse Navigator Rockwell Germany, RN as Oncology Nurse Navigator Alla Feeling, NP as Nurse Practitioner (Nurse Practitioner) Kyung Rudd, MD as Consulting Physician (Radiation Oncology)  BRIEF ONCOLOGIC HISTORY:  Oncology History  Cancer of central portion of left female breast ([place])  04/28/2018 Cancer Staging   Staging form: Breast, AJCC 8th Edition - Clinical stage from 04/28/2018: Stage IB (cT1c, cN0, cM0, G3, ER-, PR-, HER2-) - Signed by Truitt Merle, MD on 05/24/2018   05/24/2018 Initial Diagnosis   Cancer of central portion of left female breast ([place])   06/02/2018 Cancer Staging   Staging form: Breast, AJCC 8th Edition - Pathologic stage from 06/02/2018: Stage IIA (pT2, pN0, cM0, G3, ER-, PR-, HER2-) - Signed by Truitt Merle, MD on 06/23/2018   06/07/2018 Surgery   LEFT BREAST PARTIAL MASTECTOMY WITH AXILLARY SENTINEL LYMPH NODE BIOPSY and PAC placement by Dr. Ninfa Linden  06/07/18   06/07/2018 Pathology Results   Diagnosis 06/07/18 1. Breast, partial mastectomy, Left - INVASIVE DUCTAL CARCINOMA, GRADE III, 2.1 CM 1 of 4 FINAL for Jennifer, Johns J [phone]) Diagnosis(continued) - SURGICAL RESECTION MARGINS ARE NEGATIVE FOR CARCINOMA. - NEGATIVE FOR LYMPHOVASCULAR OR PERINEURAL INVASION. - BIOPSY SITE CHANGES. - SEE ONCOLOGY TABLE. - SEE NOTE. 2. Lymph node, sentinel, biopsy, Left Axillary - LYMPH NODE, NEGATIVE FOR CARCINOMA (0/1). 3. Lymph node, sentinel, biopsy, Left - LYMPH NODE, NEGATIVE FOR CARCINOMA (0/1). 4. Lymph node, sentinel, biopsy, Left - LYMPH NODE, NEGATIVE FOR CARCINOMA (0/1). 5. Lymph node, sentinel, biopsy, Left - LYMPH  NODE, NEGATIVE FOR CARCINOMA (0/1). 6. Lymph node, sentinel, biopsy, Left - LYMPH NODE, NEGATIVE FOR CARCINOMA (0/1). 7. Lymph node, sentinel, biopsy, Left - LYMPH NODE, NEGATIVE FOR CARCINOMA (0/1). The tumor cells are Negative for Her2 (0). Estrogen Receptor: 0%, NEGATIVE Progesterone Receptor: 0%, NEGATIVE Proliferation Marker Ki67: 40%   06/08/2018 Breast MRI   MRI Breast B/l 05/29/18 IMPRESSION: Solitary mass within the UPPER central portion of the LEFT breast, 2.3 Centimeters in maximum diameter. No MRI evidence for adenopathy. RIGHT breast is negative. LEFT BREAST PARTIAL MASTECTOMY WITH AXILLARY SENTINEL LYMPH NODE BIOPSY with PAC placement by Dr. Ninfa Linden 06/07/18   07/11/2018 Imaging   Whole Body Bone Scan 07/11/18  IMPRESSION: No scintigraphic evidence of osseous metastatic disease.   07/13/2018 -  Chemotherapy   Adjuvant AC every 2 weeks for 4 cycles, 07/13/18-08/24/18. Followed by Taxol every 2 weeks starting 09/11/18-11/30/18   08/20/2018 Imaging   CT [facility] W Contrast 08/20/18  IMPRESSION: No acute findings in the abdomen/pelvis. Mild prominence of the main pancreatic duct measuring 6 mm. No evidence of mass, adenopathy or ductal stones. Subtle focal nodularity over the posterior left lower lobe which may be acute or chronic and may be due to an infectious versus atypical infectious or inflammatory process. Recommend follow-up chest CT 4-6 weeks. Two subcentimeter liver hypodensities too small to characterize but likely cysts. Aortic Atherosclerosis (ICD10-I70.0).   01/15/2019 - 02/09/2019 Radiation Therapy   01/15/2019-02/09/2019:  The left breast was treated to 50.56 Gy in 16 fractions followed by a 8 Gy boost in 4 fractions.20 Per Dr. Lisbeth Renshaw    Survivorship   Per Cira Rue, NP      INTERVAL  HISTORY:  Jennifer Johns presents to the [facility] today for our initial meeting to review her survivorship care plan detailing her treatment course for breast  cancer, as well as monitoring long-term side effects of that treatment, education regarding health maintenance, screening, and overall wellness and health promotion.     Overall, Ms. Frink reports doing well since completing her radiation therapy approximately 6 weeks ago.  She tolerated radiation well.  She has no specific concerns today.  Has some mild intermittent residual neuropathy in hands and feet, but not limiting function. Chronic cough is at baseline related to chronic bronchitis and asthma. Denies fatigue, decreased appetite or unintentional weight loss. Denies fever or chills, denies change in bowel habits, denies GI or vaginal bleeding.  Denies new abdominal or bone pain. Denies changes in her breast such as new lump, nipple inversion or discharge, or skin change. Denies lymphedema.      ONCOLOGY TREATMENT TEAM:  1. Surgeon:  Dr. Ninfa Linden at [facility] 2. Medical Oncologist: Dr. Burr Medico,  3. Radiation Oncologist: Dr. Lisbeth Renshaw     PAST MEDICAL/SURGICAL HISTORY:  Past Medical History:  Diagnosis Date   Asthma    Breast cancer ([place])    left breast   Cancer ([place])    Left Breast   COPD (chronic obstructive pulmonary disease) ([place])    Hypertension    Past Surgical History:  Procedure Laterality Date   COLONOSCOPY N/A 03/18/2016   Procedure: COLONOSCOPY;  Surgeon: Danie Binder, MD;  [facility];  Service: Endoscopy;  Laterality: N/A;  10:30 AM   PARTIAL MASTECTOMY WITH AXILLARY SENTINEL LYMPH NODE BIOPSY Left 06/07/2018   Procedure: LEFT BREAST PARTIAL MASTECTOMY WITH AXILLARY SENTINEL LYMPH NODE BIOPSY;  Surgeon: Coralie Keens, MD;  Location: [facility];  Service: General;  Laterality: Left;   PORTACATH PLACEMENT N/A 06/07/2018   Procedure: INSERTION PORT-A-CATH;  Surgeon: Coralie Keens, MD;  Location: [facility];  Service: General;  Laterality: N/A;     ALLERGIES:  Allergies  Allergen Reactions   Carrot [Daucus Carota] Hives and Shortness Of  Breath   Other Hives    "chitlins" or pig intestine     CURRENT MEDICATIONS:  Outpatient Encounter Medications as of 03/29/2019  Medication Sig   albuterol (ACCUNEB) 1.25 MG/3ML nebulizer solution Take 1 ampule by nebulization every 6 (six) hours as needed for wheezing or shortness of breath.    albuterol (PROVENTIL HFA;VENTOLIN HFA) 108 (90 Base) MCG/ACT inhaler Inhale 2 puffs into the lungs every 6 (six) hours as needed for wheezing or shortness of breath.    cholecalciferol (VITAMIN D) 1000 units tablet Take 1,000 Units by mouth daily.    gabapentin (NEURONTIN) 300 MG capsule Take 1 capsule (300 mg total) by mouth at bedtime.   lidocaine-prilocaine (EMLA) cream Apply to affected area once   lisinopril (PRINIVIL,ZESTRIL) 10 MG tablet Take 10 mg by mouth daily.   ranitidine (ZANTAC) 150 MG tablet Take 150 mg by mouth daily.    loperamide (IMODIUM) 2 MG capsule Take 2 tabs with first watery stool then 1 tab after each subsequent watery stool. Do not exceed 8 tabs per day   ondansetron (ZOFRAN) 8 MG tablet Take 1 tablet (8 mg total) by mouth 2 (two) times daily as needed. Start on the third day after chemotherapy.   prochlorperazine (COMPAZINE) 10 MG tablet Take 1 tablet (10 mg total) by mouth every 6 (six) hours as needed (Nausea or vomiting).   [DISCONTINUED] sodium chloride flush (NS) 0.9 % injection  10 mL    No facility-administered encounter medications on file as of 03/29/2019.      ONCOLOGIC FAMILY HISTORY:  Family History  Problem Relation Age of Onset   Breast cancer Mother    Prostate cancer Father      GENETIC COUNSELING/TESTING: No   SOCIAL HISTORY:  KHALISE BILLARD is disabled, not working.     PHYSICAL EXAMINATION:  Vital Signs:   Vitals:   03/29/19 1000 03/29/19 1003  BP: (!) 164/83 (!) 162/84  Pulse: 92   Resp: 17   Temp: 98.6 F (37 C)   SpO2: 94%    Filed Weights   03/29/19 1000  Weight: 132 lb 8 oz (60.1 kg)   General: Well-nourished,  well-appearing female in no acute distress.   HEENT: Sclerae anicteric.  Lymph: No cervical, supraclavicular, or infraclavicular lymphadenopathy noted on palpation.  Respiratory: Respirations even and unlabored Neuro: Normal gait Psych: Mood and affect normal and appropriate for situation.  Extremities: No edema. MSK: Full range of motion in bilateral upper extremities Skin: Warm and dry. Breast exam: Status post left partial mastectomy, incision completely healed, mild hyperpigmentation. No palpable mass in either breast or axilla that I could appreciate.   Limited exam for COVID-19 outbreak  LABORATORY DATA:   CBC Latest Ref Rng & Units 03/29/2019 12/28/2018 11/30/2018  WBC 4.0 - 10.5 K/uL 5.3 7.3 2.9(L)  Hemoglobin 12.0 - 15.0 g/dL 11.6(L) 12.0 8.8(L)  Hematocrit 36.0 - 46.0 % 37.0 37.6 28.0(L)  Platelets 150 - 400 K/uL 179 229 247   CMP Latest Ref Rng & Units 03/29/2019 12/28/2018 11/30/2018  Glucose 70 - 99 mg/dL 104(H) 90 103(H)  BUN 8 - 23 mg/dL _0 Creatinine 0.44 - 1.00 mg/dL 0.92 0.91 1.15(H)  Sodium 135 - 145 mmol/L 143 140 140  Potassium 3.5 - 5.1 mmol/L 3.7 4.0 3.8  Chloride 98 - 111 mmol/L 109 103 106  CO2 22 - 32 mmol/L _1 Calcium 8.9 - 10.3 mg/dL 10.0 10.7(H) 9.9  Total Protein 6.5 - 8.1 g/dL 7.0 7.3 6.5  Total Bilirubin 0.3 - 1.2 mg/dL 0.2(L) 0.3 0.3  Alkaline Phos 38 - 126 U/L 79 74 76  AST 15 - 41 U/L 16 16 12(L)  ALT 0 - 44 U/L _2 DIAGNOSTIC IMAGING:  None for this visit.      ASSESSMENT AND PLAN:  Ms.. Winkels is a pleasant 63 y.o. 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 She presents to the Survivorship Clinic for our initial meeting and routine follow-up post-completion of treatment for breast cancer.    1. Stage II left breast cancer:  Ms. Mcquinn is continuing to recover from definitive  treatment for breast cancer. She will follow-up with her medical oncologist, Dr. Burr Medico in 06/2019 with history and physical exam per surveillance protocol.  She appears well.  Labs unremarkable except HB 11.6, likely residual from chemotherapy. Physical exam is unremarkable. No clinical concern for recurrence. She is due for first mammogram since diagnosis. I placed the orders.  She has dense breast tissue, 50 to 75% glandular.  Given her high risk disease, she would likely benefit from screening MRI in the future.  She is interested.  Is interested in [facility] removal, I will verify with Dr. Burr Medico and refer back to Dr. Ninfa Linden.  Today, a comprehensive survivorship care plan and treatment summary was  reviewed with the patient today detailing her breast cancer diagnosis, treatment course, potential late/long-term effects of treatment, appropriate follow-up care with recommendations for the future, and patient education resources.  A copy of this summary, along with a letter will be sent to the patients primary care provider via mail/fax/In Basket message after todays visit.    2. Bone health:  Given Ms. Gencarelli's age/history of breast cancer and her previous chemotherapy treatment regimen, she is at risk for bone demineralization.  She has not had DEXA scan yet. I placed an order. She can have this done with mammogram. In the meantime, she was encouraged to increase her consumption of foods rich in calcium, as well as increase her weight-bearing activities.  She was given education on specific activities to promote bone health.  She has a history of hypercalcemia, can take calcium supplement every other day.  Calcium level is normal today.  3. Cancer screening:  Due to Ms. Moeller's history and her age, she should receive screening for skin cancers, colon cancer, and gynecologic cancers. Last colonoscopy in 2017 with 3 polyps removed, 3 year recall. She is due again for this.  She has an appointment with GI on  05/16/2019, patient is aware.  Encouraged her to follow-up with PCP for pelvic exam. The information and recommendations are listed on the patient's comprehensive care plan/treatment summary and were reviewed in detail with the patient.    4. Health maintenance and wellness promotion: Ms. Gravois was encouraged to consume 5-7 servings of fruits and vegetables per day. We reviewed the "Nutrition Rainbow" handout, as well as the handout "Take Control of Your Health and Reduce Your Cancer Risk" from the [facility].  She was also encouraged to engage in moderate to vigorous exercise for 30 minutes per day most days of the week. We discussed the [employer] fitness program, which is designed for cancer survivors to help them become more physically fit after cancer treatments.  She was instructed to limit her alcohol consumption and continue to abstain from tobacco use.     5. Support services/counseling: It is not uncommon for this period of the patient's cancer care trajectory to be one of many emotions and stressors.  We discussed an opportunity for her to participate in the next session of [facility] ("Finding Your New Normal") support group series designed for patients after they have completed treatment.   Ms. Bellevue was encouraged to take advantage of our many other support services programs, support groups, and/or counseling in coping with her new life as a cancer survivor after completing anti-cancer treatment.  She was offered support today through active listening and expressive supportive counseling.  She was given information regarding our available services and encouraged to contact me with any questions or for help enrolling in any of our support group/programs.   Dispo:   -Return to cancer center 06/2019 -Mammogram and DEXA due, orders placed  -Follow up with surgery as scheduled  -Follow-up with GI 05/16/2019 -Refer back to Dr. Ninfa Linden for [facility] removal   -She is welcome to return  back to the Survivorship Clinic at any time; no additional follow-up needed at this time.  -Consider referral back to survivorship as a long-term survivor for continued surveillance   Orders Placed This Encounter  Procedures   MM DIAG BREAST TOMO BILATERAL    Standing Status:   Future    Standing Expiration Date:   03/28/2020    Scheduling Instructions:     [facility]  Order Specific Question:   Reason for Exam (SYMPTOM  OR DIAGNOSIS REQUIRED)    Answer:   History of stage II left breast cancer s/p lumpectomy, chemo, radiation    Order Specific Question:   Preferred imaging location?    Answer:   External   DG Bone Density    Standing Status:   Future    Standing Expiration Date:   03/28/2020    Scheduling Instructions:     [facility]    Order Specific Question:   Reason for Exam (SYMPTOM  OR DIAGNOSIS REQUIRED)    Answer:   postmenopausal, h/o chemotherapy    Order Specific Question:   Preferred imaging location?    Answer:   External   Ambulatory referral to General Surgery    Referral Priority:   Routine    Referral Type:   Surgical    Referral Reason:   Specialty Services Required    Referred to Provider:   Coralie Keens, MD    Requested Specialty:   General Surgery    Number of Visits Requested:   1    A total of (30) minutes of face-to-face time was spent with this patient with greater than 50% of that time in counseling and care-coordination.   Cira Rue, NP [facility] [phone]   Note: PRIMARY CARE PROVIDER [facility] [phone]

## 2019-03-29 ENCOUNTER — Telehealth: Payer: Self-pay | Admitting: Nurse Practitioner

## 2019-03-29 ENCOUNTER — Encounter: Payer: Self-pay | Admitting: Nurse Practitioner

## 2019-03-29 ENCOUNTER — Inpatient Hospital Stay: Payer: Medicaid Other

## 2019-03-29 ENCOUNTER — Other Ambulatory Visit: Payer: Self-pay

## 2019-03-29 ENCOUNTER — Inpatient Hospital Stay: Payer: Medicaid Other | Attending: Nurse Practitioner | Admitting: Nurse Practitioner

## 2019-03-29 VITALS — BP 162/84 | HR 92 | Temp 98.6°F | Resp 17 | Ht 66.0 in | Wt 132.5 lb

## 2019-03-29 DIAGNOSIS — Z171 Estrogen receptor negative status [ER-]: Secondary | ICD-10-CM | POA: Diagnosis not present

## 2019-03-29 DIAGNOSIS — E2839 Other primary ovarian failure: Secondary | ICD-10-CM

## 2019-03-29 DIAGNOSIS — C50112 Malignant neoplasm of central portion of left female breast: Secondary | ICD-10-CM | POA: Diagnosis present

## 2019-03-29 DIAGNOSIS — Z95828 Presence of other vascular implants and grafts: Secondary | ICD-10-CM

## 2019-03-29 LAB — CBC WITH DIFFERENTIAL (CANCER CENTER ONLY)
Abs Immature Granulocytes: 0.01 10*3/uL (ref 0.00–0.07)
Basophils Absolute: 0 10*3/uL (ref 0.0–0.1)
Basophils Relative: 1 %
Eosinophils Absolute: 0.1 10*3/uL (ref 0.0–0.5)
Eosinophils Relative: 1 %
HCT: 37 % (ref 36.0–46.0)
Hemoglobin: 11.6 g/dL — ABNORMAL LOW (ref 12.0–15.0)
Immature Granulocytes: 0 %
Lymphocytes Relative: 20 %
Lymphs Abs: 1.1 10*3/uL (ref 0.7–4.0)
MCH: 27.8 pg (ref 26.0–34.0)
MCHC: 31.4 g/dL (ref 30.0–36.0)
MCV: 88.5 fL (ref 80.0–100.0)
Monocytes Absolute: 0.6 10*3/uL (ref 0.1–1.0)
Monocytes Relative: 12 %
Neutro Abs: 3.5 10*3/uL (ref 1.7–7.7)
Neutrophils Relative %: 66 %
Platelet Count: 179 10*3/uL (ref 150–400)
RBC: 4.18 MIL/uL (ref 3.87–5.11)
RDW: 13.3 % (ref 11.5–15.5)
WBC Count: 5.3 10*3/uL (ref 4.0–10.5)
nRBC: 0 % (ref 0.0–0.2)

## 2019-03-29 LAB — CMP (CANCER CENTER ONLY)
ALT: 13 U/L (ref 0–44)
AST: 16 U/L (ref 15–41)
Albumin: 3.6 g/dL (ref 3.5–5.0)
Alkaline Phosphatase: 79 U/L (ref 38–126)
Anion gap: 7 (ref 5–15)
BUN: 17 mg/dL (ref 8–23)
CO2: 27 mmol/L (ref 22–32)
Calcium: 10 mg/dL (ref 8.9–10.3)
Chloride: 109 mmol/L (ref 98–111)
Creatinine: 0.92 mg/dL (ref 0.44–1.00)
GFR, Est AFR Am: >60 mL/min (ref >60)
GFR, Estimated: >60 mL/min (ref >60)
Glucose, Bld: 104 mg/dL — ABNORMAL HIGH (ref 70–99)
Potassium: 3.7 mmol/L (ref 3.5–5.1)
Sodium: 143 mmol/L (ref 135–145)
Total Bilirubin: 0.2 mg/dL — ABNORMAL LOW (ref 0.3–1.2)
Total Protein: 7 g/dL (ref 6.5–8.1)

## 2019-03-29 MED ORDER — HEPARIN SOD (PORK) LOCK FLUSH 100 UNIT/ML IV SOLN
500.0000 [IU] | Freq: Once | INTRAVENOUS | Status: AC | PRN
  Administered 2019-03-29: 500 [IU]
  Filled 2019-03-29: qty 5

## 2019-03-29 MED ORDER — SODIUM CHLORIDE 0.9% FLUSH
10.0000 mL | INTRAVENOUS | Status: DC | PRN
  Administered 2019-03-29: 10 mL
  Filled 2019-03-29: qty 10

## 2019-03-29 NOTE — Telephone Encounter (Signed)
Scheduled appt per 7/9 los. Printed and mailed appt calendar. °

## 2019-03-29 NOTE — Patient Instructions (Signed)

## 2019-04-05 NOTE — Progress Notes (Signed)
  Radiation Oncology         [phone] ________________________________  Name: Jennifer Johns MRN: [id number]  Date: 02/09/2019  DOB: [birth year]  End of Treatment Note  Diagnosis:   63 y.o. female with Stage IIA, pT2N0M0, grade 3 triple negative invasive ductal carcinoma of the left breast  Indication for treatment:  Curative       Radiation treatment dates:   01/15/2019 - 02/09/2019  Site/dose:    1. The patient initially received a dose of 42.56 Gy in 16 fractions to the left breast using whole-breast tangent fields. This was delivered using a 3-D conformal technique.  2. The patient then received a boost to the seroma. This delivered an additional 8 Gy in 4 fractions using an en face electron field due to the depth of the seroma. The total dose was 50.56 Gy.  Beams/energy: 1. 3D / 10X, 6X Photon 2. 9E electron boost  Narrative: The patient tolerated radiation treatment relatively well.   She denied any fatigue. The patient had some expected hyperpigmentation changes as she progressed during treatment. Moist desquamation was not present at the end of treatment. She is applying Radiaplex gel to her skin as ordered.  Plan: The patient has completed radiation treatment. The patient will return to radiation oncology clinic for routine followup in one month. I advised the patient to call or return sooner if they have any questions or concerns related to their recovery or treatment. ________________________________  Jodelle Gross, MD, PhD  This document serves as a record of services personally performed by Kyung Rudd, MD. It was created on his behalf by Rae Lips, a trained medical scribe. The creation of this record is based on the scribe's personal observations and the provider's statements to them. This document has been checked and approved by the attending provider.

## 2019-04-30 ENCOUNTER — Other Ambulatory Visit: Payer: Medicaid Other

## 2019-04-30 ENCOUNTER — Encounter: Payer: Medicaid Other | Admitting: Nurse Practitioner

## 2019-05-16 ENCOUNTER — Other Ambulatory Visit: Payer: Self-pay

## 2019-05-16 ENCOUNTER — Ambulatory Visit (INDEPENDENT_AMBULATORY_CARE_PROVIDER_SITE_OTHER): Payer: Medicaid Other | Admitting: *Deleted

## 2019-05-16 DIAGNOSIS — Z86010 Personal history of colonic polyps: Secondary | ICD-10-CM

## 2019-05-16 MED ORDER — NA SULFATE-K SULFATE-MG SULF 17.5-3.13-1.6 GM/177ML PO SOLN: 1.0000 | Freq: Once | ORAL | 0 refills | Status: AC

## 2019-05-16 NOTE — Patient Instructions (Signed)
Jennifer Johns  04/12/1956 MRN: [id number]     Procedure Date: 07/02/2019 Time to register: 1:00 pm Place to register: [facility] Procedure Time: 2:00 pm Scheduled provider: Dr. Oneida Alar    PREPARATION FOR COLONOSCOPY WITH SUPREP BOWEL PREP KIT  Note: Suprep Bowel Prep Kit is a split-dose (2day) regimen. Consumption of BOTH 6-ounce bottles is required for a complete prep.  Please notify us immediately if you are diabetic, take iron supplements, or if you are on Coumadin or any other blood thinners.                                                                                                                                                 1 DAY BEFORE PROCEDURE:  DATE: 07/01/2019   DAY: Sunday Continue clear liquids the entire day - NO SOLID FOOD.    At 6:00pm: Complete steps 1 through 4 below, using ONE (1) 6-ounce bottle, before going to bed. Step 1:  Pour ONE (1) 6-ounce bottle of SUPREP liquid into the mixing container.  Step 2:  Add cool drinking water to the 16 ounce line on the container and mix.  Note: Dilute the solution concentrate as directed prior to use. Step 3:  DRINK ALL the liquid in the container. Step 4:  You MUST drink an additional two (2) or more 16 ounce containers of water over the next one (1) hour.   Continue clear liquids.  DAY OF PROCEDURE:   DATE: 07/02/2019   DAY: Monday If you take medications for your heart, blood pressure, or breathing, you may take these medications.   5 hours before your procedure at 9:00 am : Step 1:  Pour ONE (1) 6-ounce bottle of SUPREP liquid into the mixing container.  Step 2:  Add cool drinking water to the 16 ounce line on the container and mix.  Note: Dilute the solution concentrate as directed prior to use. Step 3:  DRINK ALL the liquid in the container. Step 4:  You MUST drink an additional two (2) or more 16 ounce containers of water over the next one (1) hour. You MUST complete the final glass of water at  least 3 hours before your colonoscopy. Nothing by mouth past 11:00 am  You may take your morning medications with sip of water unless we have instructed otherwise.    Please see below for Dietary Information.  CLEAR LIQUIDS INCLUDE:  Water Jello (NOT red in color)   Ice Popsicles (NOT red in color)   Tea (sugar ok, no milk/cream) Powdered fruit flavored drinks  Coffee (sugar ok, no milk/cream) Gatorade/ Lemonade/ Kool-Aid  (NOT red in color)   Juice: apple, white grape, white cranberry Soft drinks  Clear bullion, consomme, broth (fat free beef/chicken/vegetable)  Carbonated beverages (any kind)  Strained chicken noodle soup Hard Candy   Remember: Clear liquids are  liquids that will allow you to see your fingers on the other side of a clear glass. Be sure liquids are NOT red in color, and not cloudy, but CLEAR.  DO NOT EAT OR DRINK ANY OF THE FOLLOWING:  Dairy products of any kind   Cranberry juice Tomato juice / V8 juice   Grapefruit juice Orange juice     Red grape juice  Do not eat any solid foods, including such foods as: cereal, oatmeal, yogurt, fruits, vegetables, creamed soups, eggs, bread, crackers, pureed foods in a blender, etc.   HELPFUL HINTS FOR DRINKING PREP SOLUTION:   Make sure prep is extremely cold. Mix and refrigerate the the morning of the prep. You may also put in the freezer.   You may try mixing some Crystal Light or Country Time Lemonade if you prefer. Mix in small amounts; add more if necessary.  Try drinking through a straw  Rinse mouth with water or a mouthwash between glasses, to remove after-taste.  Try sipping on a cold beverage /ice/ popsicles between glasses of prep.  Place a piece of sugar-free hard candy in mouth between glasses.  If you become nauseated, try consuming smaller amounts, or stretch out the time between glasses. Stop for 30-60 minutes, then slowly start back drinking.     OTHER INSTRUCTIONS  You will need a responsible  adult at least 64 years of age to accompany you and drive you home. This person must remain in the waiting room during your procedure. The hospital will cancel your procedure if you do not have a responsible adult with you.   1. Wear loose fitting clothing that is easily removed. 2. Leave jewelry and other valuables at home.  3. Remove all body piercing jewelry and leave at home. 4. Total time from sign-in until discharge is approximately 2-3 hours. 5. You should go home directly after your procedure and rest. You can resume normal activities the day after your procedure. 6. The day of your procedure you should not:  Drive  Make legal decisions  Operate machinery  Drink alcohol  Return to work   You may call the office (Dept: [phone]) before 5:00pm, or page the doctor on call [phone]) after 5:00pm, for further instructions, if necessary.   Insurance Information YOU WILL NEED TO CHECK WITH YOUR INSURANCE COMPANY FOR THE BENEFITS OF COVERAGE YOU HAVE FOR THIS PROCEDURE.  UNFORTUNATELY, NOT ALL INSURANCE COMPANIES HAVE BENEFITS TO COVER ALL OR PART OF THESE TYPES OF PROCEDURES.  IT IS YOUR RESPONSIBILITY TO CHECK YOUR BENEFITS, HOWEVER, WE WILL BE GLAD TO ASSIST YOU WITH ANY CODES YOUR INSURANCE COMPANY MAY NEED.    PLEASE NOTE THAT MOST INSURANCE COMPANIES WILL NOT COVER A SCREENING COLONOSCOPY FOR PEOPLE UNDER THE AGE OF 50  IF YOU HAVE BC[employer] INSURANCE, YOU MAY HAVE BENEFITS FOR A SCREENING COLONOSCOPY BUT IF POLYPS ARE FOUND THE DIAGNOSIS WILL CHANGE AND THEN YOU MAY HAVE A DEDUCTIBLE THAT WILL NEED TO BE MET. SO PLEASE MAKE SURE YOU CHECK YOUR BENEFITS FOR A SCREENING COLONOSCOPY AS WELL AS A DIAGNOSTIC COLONOSCOPY.

## 2019-05-16 NOTE — Progress Notes (Addendum)
Gastroenterology Pre-Procedure Review  Request Date: 05/16/2019 Requesting Physician: 3 year recall, Last TCS 03/18/2016 done by Dr. Oneida Alar, tubular adenoma, diverticulosis, external hemorrhoids  PATIENT REVIEW QUESTIONS: The patient responded to the following health history questions as indicated:    1. Diabetes Melitis: No 2. Joint replacements in the past 12 months: No 3. Major health problems in the past 3 months: No 4. Has an artificial valve or MVP: No 5. Has a defibrillator: No 6. Has been advised in past to take antibiotics in advance of a procedure like teeth cleaning: No 7. Family history of colon cancer: No 8. Alcohol Use: No 9. History of sleep apnea: No  10. History of coronary artery or other vascular stents placed within the last 12 months: No 11. History of any prior anesthesia complications: No    MEDICATIONS & ALLERGIES:    Patient reports the following regarding taking any blood thinners:   Plavix? No Aspirin? No Coumadin? No Brilinta? No Xarelto? No Eliquis? No Pradaxa? No Savaysa? No Effient? No  Patient confirms/reports the following medications:  Current Outpatient Medications  Medication Sig Dispense Refill  . albuterol (ACCUNEB) 1.25 MG/3ML nebulizer solution Take 1 ampule by nebulization every 6 (six) hours as needed for wheezing or shortness of breath.     Marland Kitchen albuterol (PROVENTIL HFA;VENTOLIN HFA) 108 (90 Base) MCG/ACT inhaler Inhale 2 puffs into the lungs every 6 (six) hours as needed for wheezing or shortness of breath.     . budesonide-formoterol (SYMBICORT) 160-4.5 MCG/ACT inhaler Inhale 2 puffs into the lungs 2 (two) times daily.    . cholecalciferol (VITAMIN D) 1000 units tablet Take 1,000 Units by mouth daily.   1  . gabapentin (NEURONTIN) 300 MG capsule Take 1 capsule (300 mg total) by mouth at bedtime. 30 capsule 3  . hydrochlorothiazide (MICROZIDE) 12.5 MG capsule Take 12.5 mg by mouth daily.    Marland Kitchen lidocaine-prilocaine (EMLA) cream Apply to  affected area once 30 g 3  . lisinopril (PRINIVIL,ZESTRIL) 10 MG tablet Take 10 mg by mouth daily.    . Multiple Vitamins-Minerals (EQ COMPLETE MULTIVITAMIN-ADULT PO) Take by mouth daily.    Marland Kitchen omeprazole (PRILOSEC) 40 MG capsule Take 40 mg by mouth daily.     No current facility-administered medications for this visit.     Patient confirms/reports the following allergies:  Allergies  Allergen Reactions  . Carrot [Daucus Carota] Hives and Shortness Of Breath  . Other Hives    "chitlins" or pig intestine  . Zantac [Ranitidine Hcl] Rash    No orders of the defined types were placed in this encounter.   AUTHORIZATION INFORMATION Primary Insurance: Medicaid [place],  ID #: [id number] Pre-Cert / Josem Kaufmann required: No, not required  SCHEDULE INFORMATION: Procedure has been scheduled as follows:  Date: 07/02/2019, Time: 2:00  Location: [facility] with Dr. Oneida Alar  This Gastroenterology Pre-Precedure Review Form is being routed to the following provider(s): Neil Crouch, PA-C

## 2019-05-18 NOTE — Progress Notes (Signed)
Ok to schedule.

## 2019-05-18 NOTE — Addendum Note (Signed)
Addended by: Mahala Menghini on: 05/18/2019 12:21 PM   Modules accepted: Orders

## 2019-05-21 NOTE — Addendum Note (Signed)
Addended by: Metro Kung on: 05/21/2019 11:37 AM   Modules accepted: Orders, SmartSet

## 2019-06-27 NOTE — Progress Notes (Signed)
[facility]   [phone] [phone]) [phone]   Clinic Follow up Note   Patient Care Team: [facility] as PCP - General Coralie Keens, MD as Consulting Physician (General Surgery) Truitt Merle, MD as Consulting Physician (Hematology) Mauro Kaufmann, RN as Oncology Nurse Navigator Rockwell Germany, RN as Oncology Nurse Navigator Alla Feeling, NP as Nurse Practitioner (Nurse Practitioner) Kyung Rudd, MD as Consulting Physician (Radiation Oncology)  Date of Service:  06/29/2019  CHIEF COMPLAINT: F/u on left breast cancer  SUMMARY OF ONCOLOGIC HISTORY: Oncology History  Cancer of central portion of left female breast ([place])  04/28/2018 Cancer Staging   Staging form: Breast, AJCC 8th Edition - Clinical stage from 04/28/2018: Stage IB (cT1c, cN0, cM0, G3, ER-, PR-, HER2-) - Signed by Truitt Merle, MD on 05/24/2018   05/24/2018 Initial Diagnosis   Cancer of central portion of left female breast ([place])   06/02/2018 Cancer Staging   Staging form: Breast, AJCC 8th Edition - Pathologic stage from 06/02/2018: Stage IIA (pT2, pN0, cM0, G3, ER-, PR-, HER2-) - Signed by Truitt Merle, MD on 06/23/2018   06/07/2018 Surgery   LEFT BREAST PARTIAL MASTECTOMY WITH AXILLARY SENTINEL LYMPH NODE BIOPSY and PAC placement by Dr. Ninfa Linden  06/07/18   06/07/2018 Pathology Results   Diagnosis 06/07/18 1. Breast, partial mastectomy, Left - INVASIVE DUCTAL CARCINOMA, GRADE III, 2.1 CM 1 of 4 FINAL for Jennifer Johns, Jennifer Johns [id number]) Diagnosis(continued) - SURGICAL RESECTION MARGINS ARE NEGATIVE FOR CARCINOMA. - NEGATIVE FOR LYMPHOVASCULAR OR PERINEURAL INVASION. - BIOPSY SITE CHANGES. - SEE ONCOLOGY TABLE. - SEE NOTE. 2. Lymph node, sentinel, biopsy, Left Axillary - LYMPH NODE, NEGATIVE FOR CARCINOMA (0/1). 3. Lymph node, sentinel, biopsy, Left - LYMPH NODE, NEGATIVE FOR CARCINOMA (0/1). 4. Lymph node, sentinel, biopsy, Left - LYMPH NODE, NEGATIVE FOR CARCINOMA  (0/1). 5. Lymph node, sentinel, biopsy, Left - LYMPH NODE, NEGATIVE FOR CARCINOMA (0/1). 6. Lymph node, sentinel, biopsy, Left - LYMPH NODE, NEGATIVE FOR CARCINOMA (0/1). 7. Lymph node, sentinel, biopsy, Left - LYMPH NODE, NEGATIVE FOR CARCINOMA (0/1). The tumor cells are Negative for Her2 (0). Estrogen Receptor: 0%, NEGATIVE Progesterone Receptor: 0%, NEGATIVE Proliferation Marker Ki67: 40%   06/08/2018 Breast MRI   MRI Breast B/l 05/29/18 IMPRESSION: Solitary mass within the UPPER central portion of the LEFT breast, 2.3 Centimeters in maximum diameter. No MRI evidence for adenopathy. RIGHT breast is negative. LEFT BREAST PARTIAL MASTECTOMY WITH AXILLARY SENTINEL LYMPH NODE BIOPSY with PAC placement by Dr. Ninfa Linden 06/07/18   07/11/2018 Imaging   Whole Body Bone Scan 07/11/18  IMPRESSION: No scintigraphic evidence of osseous metastatic disease.   07/13/2018 -  Chemotherapy   Adjuvant AC every 2 weeks for 4 cycles, 07/13/18-08/24/18. Followed by Taxol every 2 weeks starting 09/11/18-11/30/18   08/20/2018 Imaging   CT AP W Contrast 08/20/18  IMPRESSION: No acute findings in the abdomen/pelvis. Mild prominence of the main pancreatic duct measuring 6 mm. No evidence of mass, adenopathy or ductal stones. Subtle focal nodularity over the posterior left lower lobe which may be acute or chronic and may be due to an infectious versus atypical infectious or inflammatory process. Recommend follow-up chest CT 4-6 weeks. Two subcentimeter liver hypodensities too small to characterize but likely cysts. Aortic Atherosclerosis (ICD10-I70.0).   01/15/2019 - 02/09/2019 Radiation Therapy   01/15/2019-02/09/2019:  The left breast was treated to 50.56 Gy in 16 fractions followed by a 8 Gy boost in 4 fractions.20 Per Dr. Lisbeth Renshaw    Survivorship  Per Cira Rue, NP       CURRENT THERAPY:  Surveillance   INTERVAL HISTORY:  Jennifer Johns is here for a follow up after radiation. She presents  to the clinic alone. She notes she is doing well. She notes she has rash of right foot. She also has black big toe nail which is painful. This rash has been going on for 1-2 months. She notes she has skin lightness rash of right inner knee and b/l hips.  She notes residual tingling of her hands. This does not bother her. She plans to have colonoscopy on 10/12.    REVIEW OF SYSTEMS:   Constitutional: Denies fevers, chills or abnormal weight loss Eyes: Denies blurriness of vision Ears, nose, mouth, throat, and face: Denies mucositis or sore throat Respiratory: Denies cough, dyspnea or wheezes Cardiovascular: Denies palpitation, chest discomfort or lower extremity swelling Gastrointestinal:  Denies nausea, heartburn or change in bowel habits Skin: (+) light skin rash of inner right knee and b/l hips. (+) black big toe nail with pain Lymphatics: Denies new lymphadenopathy or easy bruising Neurological: (+) Residual tingling of her hands Behavioral/Psych: Mood is stable, no new changes  All other systems were reviewed with the patient and are negative.  MEDICAL HISTORY:  Past Medical History:  Diagnosis Date  . Asthma   . Breast cancer ([place])    left breast  . Cancer ([place])    Left Breast  . COPD (chronic obstructive pulmonary disease) ([place])   . Hypertension     SURGICAL HISTORY: Past Surgical History:  Procedure Laterality Date  . COLONOSCOPY N/A 03/18/2016   Procedure: COLONOSCOPY;  Surgeon: Danie Binder, MD;  [facility];  Service: Endoscopy;  Laterality: N/A;  10:30 AM  . PARTIAL MASTECTOMY WITH AXILLARY SENTINEL LYMPH NODE BIOPSY Left 06/07/2018   Procedure: LEFT BREAST PARTIAL MASTECTOMY WITH AXILLARY SENTINEL LYMPH NODE BIOPSY;  Surgeon: Coralie Keens, MD;  Location: [facility];  Service: General;  Laterality: Left;  . PORTACATH PLACEMENT N/A 06/07/2018   Procedure: INSERTION PORT-A-CATH;  Surgeon: Coralie Keens, MD;  Location: [facility];  Service: General;  Laterality:  N/A;    I have reviewed the social history and family history with the patient and they are unchanged from previous note.  ALLERGIES:  is allergic to carrot [daucus carota]; other; and zantac [ranitidine hcl].  MEDICATIONS:  Current Outpatient Medications  Medication Sig Dispense Refill  . albuterol (ACCUNEB) 1.25 MG/3ML nebulizer solution Take 1 ampule by nebulization every 6 (six) hours as needed for wheezing or shortness of breath.     Marland Kitchen albuterol (PROVENTIL HFA;VENTOLIN HFA) 108 (90 Base) MCG/ACT inhaler Inhale 2 puffs into the lungs every 6 (six) hours as needed for wheezing or shortness of breath.     . budesonide-formoterol (SYMBICORT) 160-4.5 MCG/ACT inhaler Inhale 2 puffs into the lungs 2 (two) times daily.    . cholecalciferol (VITAMIN D) 1000 units tablet Take 1,000 Units by mouth daily.   1  . gabapentin (NEURONTIN) 300 MG capsule Take 1 capsule (300 mg total) by mouth at bedtime. 30 capsule 3  . hydrochlorothiazide (MICROZIDE) 12.5 MG capsule Take 12.5 mg by mouth daily.    Marland Kitchen lidocaine-prilocaine (EMLA) cream Apply to affected area once (Patient taking differently: Apply 1 application topically daily as needed (port). Apply to affected area once) 30 g 3  . lisinopril (PRINIVIL,ZESTRIL) 10 MG tablet Take 10 mg by mouth daily.    Marland Kitchen omeprazole (PRILOSEC) 40 MG capsule Take 40 mg by  mouth daily.     Current Facility-Administered Medications  Medication Dose Route Frequency Provider Last Rate Last Dose  . alteplase (CATHFLO ACTIVASE) injection 2 mg  2 mg Intracatheter Once PRN Truitt Merle, MD      . sodium chloride flush (NS) 0.9 % injection 10 mL  10 mL Intracatheter PRN Truitt Merle, MD   10 mL at 06/29/19 1147    PHYSICAL EXAMINATION: ECOG PERFORMANCE STATUS: 0 - Asymptomatic  Vitals:   06/29/19 1058  BP: (!) 136/99  Pulse: 95  Resp: 20  Temp: 98.5 F (36.9 C)  SpO2: 95%   Filed Weights   06/29/19 1058  Weight: 141 lb 1.6 oz (64 kg)    GENERAL:alert, no distress and  comfortable SKIN: skin color, texture, turgor are normal (+) light colored skin rash of inner right knee (+) Blackness of big right toe with normal nail growing.  EYES: normal, Conjunctiva are pink and non-injected, sclera clear  NECK: supple, thyroid normal size, non-tender, without nodularity LYMPH:  no palpable lymphadenopathy in the cervical, axillary  LUNGS: clear to auscultation and percussion with normal breathing effort HEART: regular rate & rhythm and no murmurs and no lower extremity edema ABDOMEN:abdomen soft, non-tender and normal bowel sounds Musculoskeletal:no cyanosis of digits and no clubbing  NEURO: alert & oriented x 3 with fluent speech, no focal motor/sensory deficits BREAST: S/p left breast lumpectomy: Surgical incision healed well (+) Skin hyperpigmentation and hardness of left breast from RT. No palpable mass, nodules or adenopathy bilaterally. Breast exam benign.   LABORATORY DATA:  I have reviewed the data as listed CBC Latest Ref Rng & Units 06/29/2019 03/29/2019 12/28/2018  WBC 4.0 - 10.5 K/uL 5.9 5.3 7.3  Hemoglobin 12.0 - 15.0 g/dL 12.4 11.6(L) 12.0  Hematocrit 36.0 - 46.0 % 39.9 37.0 37.6  Platelets 150 - 400 K/uL 213 179 229     CMP Latest Ref Rng & Units 06/29/2019 03/29/2019 12/28/2018  Glucose 70 - 99 mg/dL 101(H) 104(H) 90  BUN 8 - 23 mg/dL 15 17 14   Creatinine 0.44 - 1.00 mg/dL 0.94 0.92 0.91  Sodium 135 - 145 mmol/L 144 143 140  Potassium 3.5 - 5.1 mmol/L 3.9 3.7 4.0  Chloride 98 - 111 mmol/L 107 109 103  CO2 22 - 32 mmol/L 29 27 27   Calcium 8.9 - 10.3 mg/dL 10.4(H) 10.0 10.7(H)  Total Protein 6.5 - 8.1 g/dL 7.3 7.0 7.3  Total Bilirubin 0.3 - 1.2 mg/dL 0.2(L) 0.2(L) 0.3  Alkaline Phos 38 - 126 U/L 93 79 74  AST 15 - 41 U/L 17 16 16   ALT 0 - 44 U/L 14 13 12       RADIOGRAPHIC STUDIES: I have personally reviewed the radiological images as listed and agreed with the findings in the report. No results found.   ASSESSMENT & PLAN:  Jennifer Johns is a  63 y.o. female with   1. 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 Treated with lumpectomy, adjuvant chemo with AC-T and Adjuvant radiation.  -From a breast cancer standpoint she is doing well. She has residual mild tingling of her hands, tolerable. She also has blackness of her right toenails, which is starting to recover. Labs reviewed, CBC and CMP WNL except BG 101, Ca 10.4. Physical exam unremarkable today. There is no clinical concern for recurrence.  -She is overdue for Mammogram, will proceed as soon as possible along with baseline Bone Density scan. She is agreeable.  -  She plans to have colonoscopy on 07/02/19.  -she still has PAC in place, but would like to have it removed. Will flush her today and contact Dr. Ninfa Linden about removal. She is agreeable.  -I offered her the flu shot, she declined. I strongly encouraged her to avoid infection as she is still immunocompromised from prior chemo this year. She understands.  -F/u every 4 months for the next year and then every 6 months.   2.Anemia, secondary to chemo -she was not anemic before chemo -Anemia currently resolved  3. Peripheral neuropathy, secondary to chemo, G1 -She is on gabapentin 300 mg p.o. nightly,but not very compliant  -Also encouraged her to take B complex -Has residual tingling of hands, mild and tolerable. I discussed this will take time to recover.   4. COPD, HTN -f/u with PCP and continue medications -she has quit smoking completely in 2019 -I have previously advised her to monitor her BP at home and decrease salt intake -I .previously instructed her to reduce salt intake and advised her to follow up with her PCP. She is agreeable.  -BP at 136/99 today (06/29/19)  5. Hyperglycemia  -No known history of DM. Likely related to steroids. -I previously reduced her premedication steroids to 5 mg. -Currently  resolved, BG at 101 today (06/29/19)  6. Skin hypopigmentation, likely vitiligo  -She does not have dermatologist. I discussed lighter sin color change can be vitiligo, which is autoimmune related, but is a cosmetic change. Will monitor.    Plan -Port flush today. Send message to Dr. Ninfa Linden about PAC removal -Mammogram and Bone Density scan in 2-3 weeks  -Lab, flush, f/u in 4 months    No problem-specific Assessment & Plan notes found for this encounter.   Orders Placed This Encounter  Procedures  . MM DIAG BREAST TOMO BILATERAL    Standing Status:   Future    Standing Expiration Date:   06/28/2020    Order Specific Question:   Reason for Exam (SYMPTOM  OR DIAGNOSIS REQUIRED)    Answer:   screening    Order Specific Question:   Preferred imaging location?    Answer:   [facility]  . DG Bone Density    Standing Status:   Future    Standing Expiration Date:   06/28/2020    Order Specific Question:   Reason for Exam (SYMPTOM  OR DIAGNOSIS REQUIRED)    Answer:   screening    Order Specific Question:   Preferred imaging location?    Answer:   [facility]  . SCHEDULING COMMUNICATION INJECTION    Schedule 30 minute port flush appointment   All questions were answered. The patient knows to call the clinic with any problems, questions or concerns. No barriers to learning was detected. I spent 15 minutes counseling the patient face to face. The total time spent in the appointment was 20 minutes and more than 50% was on counseling and review of test results     Truitt Merle, MD 06/29/2019   I, Joslyn Devon, am acting as scribe for Truitt Merle, MD.   I have reviewed the above documentation for accuracy and completeness, and I agree with the above.

## 2019-06-29 ENCOUNTER — Other Ambulatory Visit (HOSPITAL_COMMUNITY)
Admission: RE | Admit: 2019-06-29 | Discharge: 2019-06-29 | Disposition: A | Discharge status: Home / Self Care | Payer: Medicaid Other | Source: Ambulatory Visit | Attending: Gastroenterology | Admitting: Gastroenterology

## 2019-06-29 ENCOUNTER — Encounter: Payer: Self-pay | Admitting: Hematology

## 2019-06-29 ENCOUNTER — Inpatient Hospital Stay (HOSPITAL_BASED_OUTPATIENT_CLINIC_OR_DEPARTMENT_OTHER): Payer: Medicaid Other | Admitting: Hematology

## 2019-06-29 ENCOUNTER — Other Ambulatory Visit: Payer: Self-pay

## 2019-06-29 ENCOUNTER — Inpatient Hospital Stay: Payer: Medicaid Other | Attending: Nurse Practitioner

## 2019-06-29 VITALS — BP 136/99 | HR 95 | Temp 98.5°F | Resp 20 | Ht 66.0 in | Wt 141.1 lb

## 2019-06-29 DIAGNOSIS — C50112 Malignant neoplasm of central portion of left female breast: Secondary | ICD-10-CM

## 2019-06-29 DIAGNOSIS — Z01812 Encounter for preprocedural laboratory examination: Secondary | ICD-10-CM | POA: Diagnosis present

## 2019-06-29 DIAGNOSIS — Z853 Personal history of malignant neoplasm of breast: Secondary | ICD-10-CM | POA: Diagnosis present

## 2019-06-29 DIAGNOSIS — Z452 Encounter for adjustment and management of vascular access device: Secondary | ICD-10-CM | POA: Diagnosis not present

## 2019-06-29 DIAGNOSIS — I10 Essential (primary) hypertension: Secondary | ICD-10-CM | POA: Insufficient documentation

## 2019-06-29 DIAGNOSIS — Z95828 Presence of other vascular implants and grafts: Secondary | ICD-10-CM

## 2019-06-29 DIAGNOSIS — R739 Hyperglycemia, unspecified: Secondary | ICD-10-CM | POA: Diagnosis not present

## 2019-06-29 DIAGNOSIS — Z171 Estrogen receptor negative status [ER-]: Secondary | ICD-10-CM | POA: Diagnosis not present

## 2019-06-29 DIAGNOSIS — J449 Chronic obstructive pulmonary disease, unspecified: Secondary | ICD-10-CM | POA: Diagnosis not present

## 2019-06-29 DIAGNOSIS — Z20828 Contact with and (suspected) exposure to other viral communicable diseases: Secondary | ICD-10-CM | POA: Insufficient documentation

## 2019-06-29 DIAGNOSIS — G620 Drug-induced polyneuropathy: Secondary | ICD-10-CM | POA: Insufficient documentation

## 2019-06-29 DIAGNOSIS — E2839 Other primary ovarian failure: Secondary | ICD-10-CM | POA: Diagnosis not present

## 2019-06-29 DIAGNOSIS — D6481 Anemia due to antineoplastic chemotherapy: Secondary | ICD-10-CM | POA: Insufficient documentation

## 2019-06-29 LAB — CMP (CANCER CENTER ONLY)
ALT: 14 U/L (ref 0–44)
AST: 17 U/L (ref 15–41)
Albumin: 3.7 g/dL (ref 3.5–5.0)
Alkaline Phosphatase: 93 U/L (ref 38–126)
Anion gap: 8 (ref 5–15)
BUN: 15 mg/dL (ref 8–23)
CO2: 29 mmol/L (ref 22–32)
Calcium: 10.4 mg/dL — ABNORMAL HIGH (ref 8.9–10.3)
Chloride: 107 mmol/L (ref 98–111)
Creatinine: 0.94 mg/dL (ref 0.44–1.00)
GFR, Est AFR Am: >60 mL/min (ref >60)
GFR, Estimated: >60 mL/min (ref >60)
Glucose, Bld: 101 mg/dL — ABNORMAL HIGH (ref 70–99)
Potassium: 3.9 mmol/L (ref 3.5–5.1)
Sodium: 144 mmol/L (ref 135–145)
Total Bilirubin: 0.2 mg/dL — ABNORMAL LOW (ref 0.3–1.2)
Total Protein: 7.3 g/dL (ref 6.5–8.1)

## 2019-06-29 LAB — CBC WITH DIFFERENTIAL (CANCER CENTER ONLY)
Abs Immature Granulocytes: 0.01 10*3/uL (ref 0.00–0.07)
Basophils Absolute: 0 10*3/uL (ref 0.0–0.1)
Basophils Relative: 1 %
Eosinophils Absolute: 0.1 10*3/uL (ref 0.0–0.5)
Eosinophils Relative: 1 %
HCT: 39.9 % (ref 36.0–46.0)
Hemoglobin: 12.4 g/dL (ref 12.0–15.0)
Immature Granulocytes: 0 %
Lymphocytes Relative: 17 %
Lymphs Abs: 1 10*3/uL (ref 0.7–4.0)
MCH: 28.6 pg (ref 26.0–34.0)
MCHC: 31.1 g/dL (ref 30.0–36.0)
MCV: 91.9 fL (ref 80.0–100.0)
Monocytes Absolute: 0.6 10*3/uL (ref 0.1–1.0)
Monocytes Relative: 11 %
Neutro Abs: 4.2 10*3/uL (ref 1.7–7.7)
Neutrophils Relative %: 70 %
Platelet Count: 213 10*3/uL (ref 150–400)
RBC: 4.34 MIL/uL (ref 3.87–5.11)
RDW: 13.2 % (ref 11.5–15.5)
WBC Count: 5.9 10*3/uL (ref 4.0–10.5)
nRBC: 0 % (ref 0.0–0.2)

## 2019-06-29 LAB — SARS CORONAVIRUS 2 (TAT 6-24 HRS): SARS Coronavirus 2: NEGATIVE

## 2019-06-29 MED ORDER — HEPARIN SOD (PORK) LOCK FLUSH 100 UNIT/ML IV SOLN
500.0000 [IU] | Freq: Once | INTRAVENOUS | Status: AC | PRN
  Administered 2019-06-29: 12:00:00 500 [IU]
  Filled 2019-06-29: qty 5

## 2019-06-29 MED ORDER — ALTEPLASE 2 MG IJ SOLR
2.0000 mg | Freq: Once | INTRAMUSCULAR | Status: DC | PRN
  Filled 2019-06-29: qty 2

## 2019-06-29 MED ORDER — SODIUM CHLORIDE 0.9% FLUSH
10.0000 mL | INTRAVENOUS | Status: DC | PRN
  Administered 2019-06-29: 12:00:00 10 mL
  Filled 2019-06-29: qty 10

## 2019-07-02 ENCOUNTER — Encounter (HOSPITAL_COMMUNITY): Payer: Self-pay | Admitting: *Deleted

## 2019-07-02 ENCOUNTER — Encounter (HOSPITAL_COMMUNITY)
Admission: RE | Disposition: A | Discharge status: Home / Self Care | Payer: Self-pay | Source: Home / Self Care | Attending: Gastroenterology

## 2019-07-02 ENCOUNTER — Telehealth: Payer: Self-pay | Admitting: Hematology

## 2019-07-02 ENCOUNTER — Other Ambulatory Visit: Payer: Self-pay

## 2019-07-02 ENCOUNTER — Ambulatory Visit (HOSPITAL_COMMUNITY)
Admission: RE | Admit: 2019-07-02 | Discharge: 2019-07-02 | Disposition: A | Discharge status: Home / Self Care | Payer: Medicaid Other | Attending: Gastroenterology | Admitting: Gastroenterology

## 2019-07-02 DIAGNOSIS — K635 Polyp of colon: Secondary | ICD-10-CM | POA: Diagnosis not present

## 2019-07-02 DIAGNOSIS — K5730 Diverticulosis of large intestine without perforation or abscess without bleeding: Secondary | ICD-10-CM | POA: Insufficient documentation

## 2019-07-02 DIAGNOSIS — Z79899 Other long term (current) drug therapy: Secondary | ICD-10-CM | POA: Diagnosis not present

## 2019-07-02 DIAGNOSIS — Q438 Other specified congenital malformations of intestine: Secondary | ICD-10-CM | POA: Diagnosis not present

## 2019-07-02 DIAGNOSIS — Z09 Encounter for follow-up examination after completed treatment for conditions other than malignant neoplasm: Secondary | ICD-10-CM | POA: Diagnosis present

## 2019-07-02 DIAGNOSIS — K644 Residual hemorrhoidal skin tags: Secondary | ICD-10-CM | POA: Diagnosis not present

## 2019-07-02 DIAGNOSIS — Z860100 Personal history of colon polyps, unspecified: Secondary | ICD-10-CM

## 2019-07-02 DIAGNOSIS — I10 Essential (primary) hypertension: Secondary | ICD-10-CM | POA: Diagnosis not present

## 2019-07-02 DIAGNOSIS — Z87891 Personal history of nicotine dependence: Secondary | ICD-10-CM | POA: Diagnosis not present

## 2019-07-02 DIAGNOSIS — J449 Chronic obstructive pulmonary disease, unspecified: Secondary | ICD-10-CM | POA: Diagnosis not present

## 2019-07-02 DIAGNOSIS — Z7951 Long term (current) use of inhaled steroids: Secondary | ICD-10-CM | POA: Diagnosis not present

## 2019-07-02 DIAGNOSIS — K648 Other hemorrhoids: Secondary | ICD-10-CM | POA: Insufficient documentation

## 2019-07-02 DIAGNOSIS — D122 Benign neoplasm of ascending colon: Secondary | ICD-10-CM | POA: Diagnosis not present

## 2019-07-02 DIAGNOSIS — Z853 Personal history of malignant neoplasm of breast: Secondary | ICD-10-CM | POA: Diagnosis not present

## 2019-07-02 HISTORY — PX: COLONOSCOPY: SHX5424

## 2019-07-02 HISTORY — PX: POLYPECTOMY: SHX5525

## 2019-07-02 SURGERY — COLONOSCOPY
Anesthesia: Moderate Sedation

## 2019-07-02 MED ORDER — MEPERIDINE HCL 100 MG/ML IJ SOLN
INTRAMUSCULAR | Status: DC | PRN
  Administered 2019-07-02 (×2): 25 mg via INTRAVENOUS

## 2019-07-02 MED ORDER — MIDAZOLAM HCL 5 MG/5ML IJ SOLN
INTRAMUSCULAR | Status: AC
  Filled 2019-07-02: qty 10

## 2019-07-02 MED ORDER — MEPERIDINE HCL 100 MG/ML IJ SOLN
INTRAMUSCULAR | Status: AC
  Filled 2019-07-02: qty 2

## 2019-07-02 MED ORDER — SODIUM CHLORIDE 0.9 % IV SOLN
INTRAVENOUS | Status: DC
  Administered 2019-07-02: 09:00:00 via INTRAVENOUS

## 2019-07-02 MED ORDER — MIDAZOLAM HCL 5 MG/5ML IJ SOLN
INTRAMUSCULAR | Status: DC | PRN
  Administered 2019-07-02 (×2): 2 mg via INTRAVENOUS
  Administered 2019-07-02: 1 mg via INTRAVENOUS

## 2019-07-02 NOTE — Telephone Encounter (Signed)
Scheduled appt per 10/9 los.  Sent a staff message to get a calendar mailed out.

## 2019-07-02 NOTE — H&P (Signed)
Primary Care Physician:  [facility] Primary Gastroenterologist:  Dr. Oneida Alar  Pre-Procedure History & Physical: HPI:  Jennifer Johns is a 63 y.o. female here for  PERSONAL HISTORY OF POLYPS.  Past Medical History:  Diagnosis Date  . Asthma   . Breast cancer ([place])    left breast  . Cancer ([place])    Left Breast  . COPD (chronic obstructive pulmonary disease) ([place])   . Hypertension     Past Surgical History:  Procedure Laterality Date  . COLONOSCOPY N/A 03/18/2016   Procedure: COLONOSCOPY;  Surgeon: Danie Binder, MD;  [facility];  Service: Endoscopy;  Laterality: N/A;  10:30 AM  . PARTIAL MASTECTOMY WITH AXILLARY SENTINEL LYMPH NODE BIOPSY Left 06/07/2018   Procedure: LEFT BREAST PARTIAL MASTECTOMY WITH AXILLARY SENTINEL LYMPH NODE BIOPSY;  Surgeon: Coralie Keens, MD;  Location: [facility];  Service: General;  Laterality: Left;  . PORTACATH PLACEMENT N/A 06/07/2018   Procedure: INSERTION PORT-A-CATH;  Surgeon: Coralie Keens, MD;  Location: [facility];  Service: General;  Laterality: N/A;    Prior to Admission medications   Medication Sig Start Date End Date Taking? Authorizing Provider  albuterol (ACCUNEB) 1.25 MG/3ML nebulizer solution Take 1 ampule by nebulization every 6 (six) hours as needed for wheezing or shortness of breath.    Yes [provider]  albuterol (PROVENTIL HFA;VENTOLIN HFA) 108 (90 Base) MCG/ACT inhaler Inhale 2 puffs into the lungs every 6 (six) hours as needed for wheezing or shortness of breath.    Yes [provider]  budesonide-formoterol (SYMBICORT) 160-4.5 MCG/ACT inhaler Inhale 2 puffs into the lungs 2 (two) times daily.   Yes [provider]  cholecalciferol (VITAMIN D) 1000 units tablet Take 1,000 Units by mouth daily.  04/22/18  Yes [provider]  gabapentin (NEURONTIN) 300 MG capsule Take 1 capsule (300 mg total) by mouth at bedtime. 11/16/18  Yes Tanner, Lyndon Code., PA-C   hydrochlorothiazide (MICROZIDE) 12.5 MG capsule Take 12.5 mg by mouth daily.   Yes [provider]  lidocaine-prilocaine (EMLA) cream Apply to affected area once Patient taking differently: Apply 1 application topically daily as needed (port). Apply to affected area once 12/28/18  Yes Truitt Merle, MD  lisinopril (PRINIVIL,ZESTRIL) 10 MG tablet Take 10 mg by mouth daily.   Yes [provider]  omeprazole (PRILOSEC) 40 MG capsule Take 40 mg by mouth daily.   Yes [provider]    Allergies as of 05/21/2019 - Review Complete 05/16/2019  Allergen Reaction Noted  . Carrot [daucus carota] Hives and Shortness Of Breath 05/25/2018  . Other Hives 06/07/2018  . Zantac [ranitidine hcl] Rash 05/16/2019    Family History  Problem Relation Age of Onset  . Breast cancer Mother   . Prostate cancer Father     Social History   Socioeconomic History  . Marital status: Single    Spouse name: Not on file  . Number of children: 3  . Years of education: Not on file  . Highest education level: Not on file  Occupational History    Comment: disabled.  Social Needs  . Financial resource strain: Not on file  . Food insecurity    Worry: Not on file    Inability: Not on file  . Transportation needs    Medical: Not on file    Non-medical: Not on file  Tobacco Use  . Smoking status: Former Smoker    Packs/day: 0.50    Years: 45.00  Pack years: 22.50    Quit date: 08/20/2018    Years since quitting: 0.8  . Smokeless tobacco: Never Used  Substance and Sexual Activity  . Alcohol use: No  . Drug use: No  . Sexual activity: Not Currently  Lifestyle  . Physical activity    Days per week: Not on file    Minutes per session: Not on file  . Stress: Not on file  Relationships  . Social Herbalist on phone: Not on file    Gets together: Not on file    Attends religious service: Not on file    Active member of club or organization: Not on file    Attends meetings  of clubs or organizations: Not on file    Relationship status: Not on file  . Intimate partner violence    Fear of current or ex partner: Not on file    Emotionally abused: Not on file    Physically abused: Not on file    Forced sexual activity: Not on file  Other Topics Concern  . Not on file  Social History Narrative   3 children total but one passed. Reports her granddaughter lives with her.     Review of Systems: See HPI, otherwise negative ROS   Physical Exam: BP (!) 139/105   Pulse (!) 119   Temp 98.9 F (37.2 C) (Oral)   Resp 20   SpO2 98%  General:   Alert,  pleasant and cooperative in NAD Head:  Normocephalic and atraumatic. Neck:  Supple; Lungs:  Clear throughout to auscultation.    Heart:  Regular rate and rhythm. Abdomen:  Soft, nontender and nondistended. Normal bowel sounds, without guarding, and without rebound.   Neurologic:  Alert and  oriented x4;  grossly normal neurologically.  Impression/Plan:      PERSONAL HISTORY OF POLYPS.  PLAN: 1. TCS TODAY. DISCUSSED PROCEDURE, BENEFITS, & RISKS: < 1% chance of medication reaction, bleeding, perforation, ASPIRATION, or rupture of spleen/liver requiring surgery to fix it and missed polyps < 1 cm 10-20% of the time.

## 2019-07-02 NOTE — Progress Notes (Signed)
CC'D TO PCP °

## 2019-07-02 NOTE — Op Note (Signed)
[facility] Patient Name: Jennifer Johns Procedure Date: 07/02/2019 9:32 AM MRN: [id number] Date of Birth: November 26, 1955 Attending MD: Barney Drain MD, MD CSN: [id number] Age: 63 Admit Type: Outpatient Procedure:                Colonoscopy WITH COLD FORCEPS POLYPECTOMY Indications:              Personal history of colonic polyps Providers:                Barney Drain MD, MD, Charlsie Quest. Theda Sers RN, RN,                            Nelma Rothman, Technician Referring MD:             Abran Richard Medicines:                Meperidine 50 mg IV, Midazolam 5 mg IV Complications:            No immediate complications. Estimated Blood Loss:     Estimated blood loss was minimal. Procedure:                Pre-Anesthesia Assessment:                           - Prior to the procedure, a History and Physical                            was performed, and patient medications and                            allergies were reviewed. The patient's tolerance of                            previous anesthesia was also reviewed. The risks                            and benefits of the procedure and the sedation                            options and risks were discussed with the patient.                            All questions were answered, and informed consent                            was obtained. Prior Anticoagulants: The patient has                            taken no previous anticoagulant or antiplatelet                            agents. ASA Grade Assessment: II - A patient with                            mild systemic disease. After reviewing the risks  and benefits, the patient was deemed in                            satisfactory condition to undergo the procedure.                            After obtaining informed consent, the colonoscope                            was passed under direct vision. Throughout the                            procedure, the patient's blood  pressure, pulse, and                            oxygen saturations were monitored continuously. The                            [id number]-[id number]) scope was introduced through                            the anus and advanced to the the cecum, identified                            by appendiceal orifice and ileocecal valve. The                            colonoscopy was performed with difficulty due to                            restricted mobility of the colon and significant                            looping. Successful completion of the procedure was                            aided by changing FROM PEDS COLONOSCOPE TO THE                            ULTRALSIM COLONOSCOPEndoscopes, straightening and                            shortening the scope to obtain bowel loop reduction                            and COLOWRAP. The patient tolerated the procedure                            fairly well. The quality of the bowel preparation                            was excellent. The ileocecal valve, appendiceal  orifice, and rectum were photographed. Scope In: 9:58:03 AM Scope Out: 10:15:32 AM Scope Withdrawal Time: 0 hours 15 minutes 34 seconds  Total Procedure Duration: 0 hours 17 minutes 29 seconds  Findings:      A 3 mm polyp was found in the mid ascending colon. The polyp was       sessile. The polyp was removed with a cold biopsy forceps. Resection and       retrieval were complete.      A few small-mouthed diverticula were found in the recto-sigmoid colon       and sigmoid colon.      External and internal hemorrhoids were found. The hemorrhoids were       moderate.      The recto-sigmoid colon, sigmoid colon, descending colon and splenic       flexure were grossly tortuous. Impression:               - One 3 mm polyp in the mid ascending colon,                            removed with a cold biopsy forceps. Resected and                            retrieved.                            - Diverticulosis in the recto-sigmoid colon and in                            the sigmoid colon.                           - External and internal hemorrhoids.                           - Tortuous colon. Moderate Sedation:      Moderate (conscious) sedation was administered by the endoscopy nurse       and supervised by the endoscopist. The following parameters were       monitored: oxygen saturation, heart rate, blood pressure, and response       to care. Total physician intraservice time was 36 minutes. Recommendation:           - Patient has a contact number available for                            emergencies. The signs and symptoms of potential                            delayed complications were discussed with the                            patient. Return to normal activities tomorrow.                            Written discharge instructions were provided to the  patient.                           - High fiber diet.                           - Continue present medications.                           - Await pathology results.                           - Repeat colonoscopy in 5 years for surveillance. Procedure Code(s):        --- Professional ---                           [phone], Colonoscopy, flexible; with biopsy, single                            or multiple                           99153, Moderate sedation; each additional 15                            minutes intraservice time                           G0500, Moderate sedation services provided by the                            same physician or other qualified health care                            professional performing a gastrointestinal                            endoscopic service that sedation supports,                            requiring the presence of an independent trained                            observer to assist in the monitoring of the                             patient's level of consciousness and physiological                            status; initial 15 minutes of intra-service time;                            patient age 17 years or older (additional time may                            be reported with [phone], as appropriate) Diagnosis Code(s):        ---  Professional ---                           K63.5, Polyp of colon                           K64.8, Other hemorrhoids                           Z86.010, Personal history of colonic polyps                           K57.30, Diverticulosis of large intestine without                            perforation or abscess without bleeding                           Q43.8, Other specified congenital malformations of                            intestine CPT copyright 2019 American [employer] Association. All rights reserved. The codes documented in this report are preliminary and upon coder review may  be revised to meet current compliance requirements. Barney Drain, MD Barney Drain MD, MD 07/02/2019 10:42:05 AM This report has been signed electronically. Number of Addenda: 0

## 2019-07-02 NOTE — Discharge Instructions (Signed)
You have small internal hemorrhoids and diverticulosis IN YOUR LEFT COLON. YOU HAD ONE SMALL POLYP REMOVED.    DRINK WATER TO KEEP YOUR URINE LIGHT YELLOW.  FOLLOW A HIGH FIBER DIET. AVOID ITEMS THAT CAUSE BLOATING. See info below.   USE PREPARATION H FOUR TIMES  A DAY IF NEEDED TO RELIEVE RECTAL PAIN/PRESSURE/BLEEDING.   YOUR BIOPSY RESULTS WILL BE BACK IN 5 BUSINESS DAYS.  Next colonoscopy in 5 years.  Colonoscopy Care After Read the instructions outlined below and refer to this sheet in the next week. These discharge instructions provide you with general information on caring for yourself after you leave the hospital. While your treatment has been planned according to the most current medical practices available, unavoidable complications occasionally occur. If you have any problems or questions after discharge, call DR. Vegas Fritze, [phone].  ACTIVITY  You may resume your regular activity, but move at a slower pace for the next 24 hours.   Take frequent rest periods for the next 24 hours.   Walking will help get rid of the air and reduce the bloated feeling in your belly (abdomen).   No driving for 24 hours (because of the medicine (anesthesia) used during the test).   You may shower.   Do not sign any important legal documents or operate any machinery for 24 hours (because of the anesthesia used during the test).    NUTRITION  Drink plenty of fluids.   You may resume your normal diet as instructed by your doctor.   Begin with a light meal and progress to your normal diet. Heavy or fried foods are harder to digest and may make you feel sick to your stomach (nauseated).   Avoid alcoholic beverages for 24 hours or as instructed.    MEDICATIONS  You may resume your normal medications.   WHAT YOU CAN EXPECT TODAY  Some feelings of bloating in the abdomen.   Passage of more gas than usual.   Spotting of blood in your stool or on the toilet paper  .  IF YOU  HAD POLYPS REMOVED DURING THE COLONOSCOPY:  Eat a soft diet IF YOU HAVE NAUSEA, BLOATING, ABDOMINAL PAIN, OR VOMITING.    FINDING OUT THE RESULTS OF YOUR TEST Not all test results are available during your visit. DR. Oneida Alar WILL CALL YOU WITHIN 14 DAYS OF YOUR PROCEDUE WITH YOUR RESULTS. Do not assume everything is normal if you have not heard from DR. Archimedes Harold, CALL HER OFFICE AT [phone].  SEEK IMMEDIATE MEDICAL ATTENTION AND CALL THE OFFICE: [phone] IF:  You have more than a spotting of blood in your stool.   Your belly is swollen (abdominal distention).   You are nauseated or vomiting.   You have a temperature over 101F.   You have abdominal pain or discomfort that is severe or gets worse throughout the day.  High-Fiber Diet A high-fiber diet changes your normal diet to include more whole grains, legumes, fruits, and vegetables. Changes in the diet involve replacing refined carbohydrates with unrefined foods. The calorie level of the diet is essentially unchanged. The Dietary Reference Intake (recommended amount) for adult males is 38 grams per day. For adult females, it is 25 grams per day. Pregnant and lactating women should consume 28 grams of fiber per day. Fiber is the intact part of a plant that is not broken down during digestion. Functional fiber is fiber that has been isolated from the plant to provide a beneficial effect in the body.  PURPOSE  Increase stool bulk.   Ease and regulate bowel movements.   Lower cholesterol.   REDUCE RISK OF COLON CANCER  INDICATIONS THAT YOU NEED MORE FIBER  Constipation and hemorrhoids.   Uncomplicated diverticulosis (intestine condition) and irritable bowel syndrome.   Weight management.   As a protective measure against hardening of the arteries (atherosclerosis), diabetes, and cancer.   GUIDELINES FOR INCREASING FIBER IN THE DIET  Start adding fiber to the diet slowly. A gradual increase of about 5 more grams (2  servings of most fruits or vegetables) per day is best. Too rapid an increase in fiber may result in constipation, flatulence, and bloating.   Drink enough water and fluids to keep your urine clear or pale yellow. Water, juice, or caffeine-free drinks are recommended. Not drinking enough fluid may cause constipation.   Eat a variety of high-fiber foods rather than one type of fiber.   Try to increase your intake of fiber through using high-fiber foods rather than fiber pills or supplements that contain small amounts of fiber.   The goal is to change the types of food eaten. Do not supplement your present diet with high-fiber foods, but replace foods in your present diet.    Polyps, Colon  A polyp is extra tissue that grows inside your body. Colon polyps grow in the large intestine. The large intestine, also called the colon, is part of your digestive system. It is a long, hollow tube at the end of your digestive tract where your body makes and stores stool. Most polyps are not dangerous. They are benign. This means they are not cancerous. But over time, some types of polyps can turn into cancer. Polyps that are smaller than a pea are usually not harmful. But larger polyps could someday become or may already be cancerous. To be safe, doctors remove all polyps and test them.   PREVENTION There is not one sure way to prevent polyps. You might be able to lower your risk of getting them if you:  Eat more fruits and vegetables and less fatty food.   Do not smoke.   Avoid alcohol.   Exercise every day.   Lose weight if you are overweight.   Eating more calcium and folate can also lower your risk of getting polyps. Some foods that are rich in calcium are milk, cheese, and broccoli. Some foods that are rich in folate are chickpeas, kidney beans, and spinach.    Diverticulosis Diverticulosis is a common condition that develops when small pouches (diverticula) form in the wall of the colon. The  risk of diverticulosis increases with age. It happens more often in people who eat a low-fiber diet. Most individuals with diverticulosis have no symptoms. Those individuals with symptoms usually experience belly (abdominal) pain, constipation, or loose stools (diarrhea).  HOME CARE INSTRUCTIONS  Increase the amount of fiber in your diet as directed by your caregiver or dietician. This may reduce symptoms of diverticulosis.   Drink at least 6 to 8 glasses of water each day to prevent constipation.   Try not to strain when you have a bowel movement.   Avoiding nuts and seeds to prevent complications is NOT NECESSARY.   FOODS HAVING HIGH FIBER CONTENT INCLUDE:  Fruits. Apple, peach, pear, tangerine, raisins, prunes.   Vegetables. Brussels sprouts, asparagus, broccoli, cabbage, carrot, cauliflower, romaine lettuce, spinach, summer squash, tomato, winter squash, zucchini.   Starchy Vegetables. Baked beans, kidney beans, lima beans, split peas, lentils, potatoes (with skin).    SEEK  IMMEDIATE MEDICAL CARE IF:  You develop increasing pain or severe bloating.   You have an oral temperature above 101F.   You develop vomiting or bowel movements that are bloody or black.   Hemorrhoids Hemorrhoids are dilated (enlarged) veins around the rectum. Sometimes clots will form in the veins. This makes them swollen and painful. These are called thrombosed hemorrhoids. Causes of hemorrhoids include:  Constipation.   Straining to have a bowel movement.   HEAVY LIFTING   HOME CARE INSTRUCTIONS  Eat a well balanced diet and drink 6 to 8 glasses of water every day to avoid constipation. You may also use a bulk laxative.   Avoid straining to have bowel movements.   Keep anal area dry and clean.   Do not use a donut shaped pillow or sit on the toilet for long periods. This increases blood pooling and pain.   Move your bowels when your body has the urge; this will require less straining and  will decrease pain and pressure.

## 2019-07-03 ENCOUNTER — Other Ambulatory Visit: Payer: Self-pay | Admitting: Surgery

## 2019-07-03 ENCOUNTER — Telehealth: Payer: Self-pay | Admitting: Gastroenterology

## 2019-07-03 LAB — SURGICAL PATHOLOGY

## 2019-07-03 NOTE — Telephone Encounter (Signed)
Please call pt. SHE HAD ONE SERRATED POLYP removed.  DRINK WATER TO KEEP YOUR URINE LIGHT YELLOW. FOLLOW A HIGH FIBER DIET. AVOID ITEMS THAT CAUSE BLOATING & GAS.  Next colonoscopy in 5 years.

## 2019-07-03 NOTE — Telephone Encounter (Signed)
LMOM to call.

## 2019-07-04 NOTE — Telephone Encounter (Signed)
Letter mailed to call for results.  

## 2019-07-05 NOTE — Telephone Encounter (Signed)
Reminder in epic °

## 2019-07-09 ENCOUNTER — Encounter (HOSPITAL_COMMUNITY): Payer: Self-pay | Admitting: Gastroenterology

## 2019-07-10 NOTE — Telephone Encounter (Signed)
PT called and is aware of results and plan.

## 2019-07-12 ENCOUNTER — Other Ambulatory Visit: Payer: Self-pay

## 2019-07-12 ENCOUNTER — Encounter (HOSPITAL_BASED_OUTPATIENT_CLINIC_OR_DEPARTMENT_OTHER): Payer: Self-pay

## 2019-07-16 ENCOUNTER — Encounter (HOSPITAL_BASED_OUTPATIENT_CLINIC_OR_DEPARTMENT_OTHER)
Admission: RE | Admit: 2019-07-16 | Discharge: 2019-07-16 | Disposition: A | Discharge status: Home / Self Care | Payer: Medicaid Other | Source: Ambulatory Visit | Attending: Surgery | Admitting: Surgery

## 2019-07-16 ENCOUNTER — Other Ambulatory Visit (HOSPITAL_COMMUNITY)
Admission: RE | Admit: 2019-07-16 | Discharge: 2019-07-16 | Disposition: A | Discharge status: Home / Self Care | Payer: Medicaid Other | Source: Ambulatory Visit | Attending: Surgery | Admitting: Surgery

## 2019-07-16 DIAGNOSIS — Z20828 Contact with and (suspected) exposure to other viral communicable diseases: Secondary | ICD-10-CM | POA: Insufficient documentation

## 2019-07-16 DIAGNOSIS — Z01812 Encounter for preprocedural laboratory examination: Secondary | ICD-10-CM | POA: Diagnosis present

## 2019-07-16 NOTE — Progress Notes (Signed)
Pt seen for PAT appt. EKG completed and O2Sat 98% on RA.  EKG reviewed with Dr. Roanna Banning, will proceed with scheduled procedure.

## 2019-07-16 NOTE — Progress Notes (Signed)
      Enhanced Recovery after Surgery Enhanced Recovery after Surgery is a protocol used to improve the stress on your body and your recovery after surgery.  Patient Instructions  . The night before surgery:  o No food after midnight. ONLY clear liquids after midnight  . The day of surgery (if you do NOT have diabetes):  o Drink ONE (1) Pre-Surgery Clear Ensure as directed.   o This drink was given to you during your hospital  pre-op appointment visit. o The pre-op nurse will instruct you on the time to drink the  Pre-Surgery Ensure depending on your surgery time. o Finish the drink at the designated time by the pre-op nurse.  o Nothing else to drink after completing the  Pre-Surgery Clear Ensure.  . The day of surgery (if you have diabetes): o Drink ONE (1) Gatorade 2 (G2) as directed. o This drink was given to you during your hospital  pre-op appointment visit.  o The pre-op nurse will instruct you on the time to drink the   Gatorade 2 (G2) depending on your surgery time. o Color of the Gatorade may vary. Red is not allowed. o Nothing else to drink after completing the  Gatorade 2 (G2).         If you have questions, please contact your surgeon's office.  Surgical scrub also given to patient with instructions for use. Patient verbalized understanding of instructions.

## 2019-07-17 LAB — NOVEL CORONAVIRUS, NAA (HOSP ORDER, SEND-OUT TO REF LAB; TAT 18-24 HRS): SARS-CoV-2, NAA: NOT DETECTED

## 2019-07-18 NOTE — H&P (Signed)
Jennifer Johns is an 63 y.o. female.   Chief Complaint: port a cath no longer needed HPI: this is a patient with a history of breast cancer.  Treatment with chemo is done.  We are proceeding now with port a cath removal.  She is doing well  Past Medical History:  Diagnosis Date  . Asthma   . Breast cancer ([place])    left breast  . Cancer ([place])    Left Breast  . COPD (chronic obstructive pulmonary disease) ([place])   . Hypertension     Past Surgical History:  Procedure Laterality Date  . COLONOSCOPY N/A 03/18/2016   Procedure: COLONOSCOPY;  Surgeon: Danie Binder, MD;  [facility];  Service: Endoscopy;  Laterality: N/A;  10:30 AM  . COLONOSCOPY N/A 07/02/2019   Procedure: COLONOSCOPY;  Surgeon: Danie Binder, MD;  [facility];  Service: Endoscopy;  Laterality: N/A;  2:00  . PARTIAL MASTECTOMY WITH AXILLARY SENTINEL LYMPH NODE BIOPSY Left 06/07/2018   Procedure: LEFT BREAST PARTIAL MASTECTOMY WITH AXILLARY SENTINEL LYMPH NODE BIOPSY;  Surgeon: Coralie Keens, MD;  Location: [facility];  Service: General;  Laterality: Left;  . POLYPECTOMY  07/02/2019   Procedure: POLYPECTOMY;  Surgeon: Danie Binder, MD;  Location: [facility];  Service: Endoscopy;;  . PORTACATH PLACEMENT N/A 06/07/2018   Procedure: INSERTION PORT-A-CATH;  Surgeon: Coralie Keens, MD;  Location: [facility] OR;  Service: General;  Laterality: N/A;    Family History  Problem Relation Age of Onset  . Breast cancer Mother   . Prostate cancer Father    Social History:  reports that she quit smoking about 10 months ago. She has a 22.50 pack-year smoking history. She has never used smokeless tobacco. She reports that she does not drink alcohol or use drugs.  Allergies:  Allergies  Allergen Reactions  . Carrot [Daucus Carota] Hives and Shortness Of Breath  . Other Hives    "chitlins" or pig intestine  . Zantac [Ranitidine Hcl] Rash    No medications prior to admission.    Results for orders placed  or performed during the hospital encounter of 07/16/19 (from the past 48 hour(s))  Novel Coronavirus, NAA (Hosp order, Send-out to Ref Lab; TAT 18-24 hrs     Status: None   Collection Time: 07/16/19  9:58 AM   Specimen: Nasopharyngeal Swab; Respiratory  Result Value Ref Range   SARS-CoV-2, NAA NOT DETECTED NOT DETECTED    Comment: (NOTE) This nucleic acid amplification test was developed and its performance characteristics determined by [employer]. Nucleic acid amplification tests include PCR and TMA. This test has not been FDA cleared or approved. This test has been authorized by FDA under an Emergency Use Authorization (EUA). This test is only authorized for the duration of time the declaration that circumstances exist justifying the authorization of the emergency use of in vitro diagnostic tests for detection of SARS-CoV-2 virus and/or diagnosis of COVID-19 infection under section 564(b)(1) of the Act, 21 U.S.C. [id number]) (1), unless the authorization is terminated or revoked sooner. When diagnostic testing is negative, the possibility of a false negative result should be considered in the context of a patient's recent exposures and the presence of clinical signs and symptoms consistent with COVID-19. An individual without symptoms of COVID- 19 and who is not shedding SARS-CoV-2 vi rus would expect to have a negative (not detected) result in this assay. Performed At: [facility] [place], [place] [id number] Rush Farmer MD [id number]  Coronavirus Source NASOPHARYNGEAL     Comment: Performed at [facility], [place] [address] [place], [place] [address]   No results found.  Review of Systems  All other systems reviewed and are negative.   Height 5\' 6"  (1.676 m), weight 63.5 kg, SpO2 98 %. Physical Exam  Constitutional: She appears well-developed and well-nourished. No distress.  Cardiovascular: Normal rate, regular rhythm and  normal heart sounds.  Respiratory: Effort normal and breath sounds normal.  GI: Soft. There is no abdominal tenderness.  Musculoskeletal: Normal range of motion.  Neurological: She is alert.  Skin: Skin is warm and dry. No erythema.  Psychiatric: Her behavior is normal.     Assessment/Plan History of breast cancer, port no longer needed  We will proceed to the OR for port-a-cath removal.  I discussed the risks with her in detail as well as the procedure.  She agrees to proceed.  Coralie Keens, MD 07/18/2019, 8:37 AM

## 2019-07-19 ENCOUNTER — Other Ambulatory Visit: Payer: Self-pay

## 2019-07-19 ENCOUNTER — Ambulatory Visit (HOSPITAL_BASED_OUTPATIENT_CLINIC_OR_DEPARTMENT_OTHER): Payer: Medicaid Other | Admitting: Anesthesiology

## 2019-07-19 ENCOUNTER — Ambulatory Visit (HOSPITAL_BASED_OUTPATIENT_CLINIC_OR_DEPARTMENT_OTHER)
Admission: RE | Admit: 2019-07-19 | Discharge: 2019-07-19 | Disposition: A | Discharge status: Home / Self Care | Payer: Medicaid Other | Attending: Surgery | Admitting: Surgery

## 2019-07-19 ENCOUNTER — Encounter (HOSPITAL_BASED_OUTPATIENT_CLINIC_OR_DEPARTMENT_OTHER): Payer: Self-pay

## 2019-07-19 ENCOUNTER — Encounter (HOSPITAL_BASED_OUTPATIENT_CLINIC_OR_DEPARTMENT_OTHER)
Admission: RE | Disposition: A | Discharge status: Home / Self Care | Payer: Self-pay | Source: Home / Self Care | Attending: Surgery

## 2019-07-19 DIAGNOSIS — Z452 Encounter for adjustment and management of vascular access device: Secondary | ICD-10-CM | POA: Insufficient documentation

## 2019-07-19 DIAGNOSIS — Z803 Family history of malignant neoplasm of breast: Secondary | ICD-10-CM | POA: Diagnosis not present

## 2019-07-19 DIAGNOSIS — Z87891 Personal history of nicotine dependence: Secondary | ICD-10-CM | POA: Insufficient documentation

## 2019-07-19 DIAGNOSIS — I10 Essential (primary) hypertension: Secondary | ICD-10-CM | POA: Insufficient documentation

## 2019-07-19 DIAGNOSIS — Z853 Personal history of malignant neoplasm of breast: Secondary | ICD-10-CM | POA: Diagnosis not present

## 2019-07-19 DIAGNOSIS — Z9221 Personal history of antineoplastic chemotherapy: Secondary | ICD-10-CM | POA: Diagnosis not present

## 2019-07-19 DIAGNOSIS — Z888 Allergy status to other drugs, medicaments and biological substances status: Secondary | ICD-10-CM | POA: Diagnosis not present

## 2019-07-19 DIAGNOSIS — J449 Chronic obstructive pulmonary disease, unspecified: Secondary | ICD-10-CM | POA: Diagnosis not present

## 2019-07-19 HISTORY — PX: PORT-A-CATH REMOVAL: SHX5289

## 2019-07-19 SURGERY — REMOVAL PORT-A-CATH
Anesthesia: Monitor Anesthesia Care | Site: Chest

## 2019-07-19 MED ORDER — CHLORHEXIDINE GLUCONATE CLOTH 2 % EX PADS: 6.0000 | MEDICATED_PAD | Freq: Once | CUTANEOUS | Status: DC

## 2019-07-19 MED ORDER — ACETAMINOPHEN 500 MG PO TABS
ORAL_TABLET | ORAL | Status: AC
  Filled 2019-07-19: qty 2

## 2019-07-19 MED ORDER — OXYCODONE HCL 5 MG PO TABS: 5.0000 mg | ORAL_TABLET | Freq: Once | ORAL | Status: DC | PRN

## 2019-07-19 MED ORDER — LACTATED RINGERS IV SOLN
INTRAVENOUS | Status: DC
  Administered 2019-07-19: 10:00:00 via INTRAVENOUS

## 2019-07-19 MED ORDER — GABAPENTIN 300 MG PO CAPS
ORAL_CAPSULE | ORAL | Status: AC
  Filled 2019-07-19: qty 1

## 2019-07-19 MED ORDER — TRAMADOL HCL 50 MG PO TABS: 50.0000 mg | ORAL_TABLET | Freq: Four times a day (QID) | ORAL | 0 refills | Status: AC | PRN

## 2019-07-19 MED ORDER — ACETAMINOPHEN 160 MG/5ML PO SOLN: 325.0000 mg | ORAL | Status: DC | PRN

## 2019-07-19 MED ORDER — ONDANSETRON HCL 4 MG/2ML IJ SOLN: 4.0000 mg | Freq: Once | INTRAMUSCULAR | Status: DC | PRN

## 2019-07-19 MED ORDER — GABAPENTIN 300 MG PO CAPS
300.0000 mg | ORAL_CAPSULE | ORAL | Status: AC
  Administered 2019-07-19: 300 mg via ORAL

## 2019-07-19 MED ORDER — MEPERIDINE HCL 25 MG/ML IJ SOLN: 6.2500 mg | INTRAMUSCULAR | Status: DC | PRN

## 2019-07-19 MED ORDER — CELECOXIB 200 MG PO CAPS
200.0000 mg | ORAL_CAPSULE | ORAL | Status: AC
  Administered 2019-07-19: 200 mg via ORAL

## 2019-07-19 MED ORDER — CELECOXIB 200 MG PO CAPS
ORAL_CAPSULE | ORAL | Status: AC
  Filled 2019-07-19: qty 1

## 2019-07-19 MED ORDER — LIDOCAINE HCL (CARDIAC) PF 100 MG/5ML IV SOSY
PREFILLED_SYRINGE | INTRAVENOUS | Status: DC | PRN
  Administered 2019-07-19: 40 mg via INTRAVENOUS

## 2019-07-19 MED ORDER — FENTANYL CITRATE (PF) 100 MCG/2ML IJ SOLN: 25.0000 ug | INTRAMUSCULAR | Status: DC | PRN

## 2019-07-19 MED ORDER — ONDANSETRON HCL 4 MG/2ML IJ SOLN
INTRAMUSCULAR | Status: DC | PRN
  Administered 2019-07-19: 4 mg via INTRAVENOUS

## 2019-07-19 MED ORDER — ACETAMINOPHEN 500 MG PO TABS
1000.0000 mg | ORAL_TABLET | ORAL | Status: AC
  Administered 2019-07-19: 10:00:00 1000 mg via ORAL

## 2019-07-19 MED ORDER — LIDOCAINE 2% (20 MG/ML) 5 ML SYRINGE
INTRAMUSCULAR | Status: AC
  Filled 2019-07-19: qty 5

## 2019-07-19 MED ORDER — CEFAZOLIN SODIUM-DEXTROSE 2-4 GM/100ML-% IV SOLN
INTRAVENOUS | Status: AC
  Filled 2019-07-19: qty 100

## 2019-07-19 MED ORDER — PHENYLEPHRINE 40 MCG/ML (10ML) SYRINGE FOR IV PUSH (FOR BLOOD PRESSURE SUPPORT)
PREFILLED_SYRINGE | INTRAVENOUS | Status: DC | PRN
  Administered 2019-07-19 (×3): 80 ug via INTRAVENOUS
  Administered 2019-07-19: 120 ug via INTRAVENOUS

## 2019-07-19 MED ORDER — PHENYLEPHRINE 40 MCG/ML (10ML) SYRINGE FOR IV PUSH (FOR BLOOD PRESSURE SUPPORT)
PREFILLED_SYRINGE | INTRAVENOUS | Status: AC
  Filled 2019-07-19: qty 10

## 2019-07-19 MED ORDER — LIDOCAINE HCL (PF) 1 % IJ SOLN
INTRAMUSCULAR | Status: DC | PRN
  Administered 2019-07-19: 10 mL

## 2019-07-19 MED ORDER — PROPOFOL 500 MG/50ML IV EMUL
INTRAVENOUS | Status: DC | PRN
  Administered 2019-07-19: 50 ug/kg/min via INTRAVENOUS

## 2019-07-19 MED ORDER — ACETAMINOPHEN 325 MG PO TABS: 325.0000 mg | ORAL_TABLET | ORAL | Status: DC | PRN

## 2019-07-19 MED ORDER — PROPOFOL 10 MG/ML IV BOLUS
INTRAVENOUS | Status: DC | PRN
  Administered 2019-07-19 (×2): 20 mg via INTRAVENOUS
  Administered 2019-07-19: 40 mg via INTRAVENOUS

## 2019-07-19 MED ORDER — PROPOFOL 500 MG/50ML IV EMUL
INTRAVENOUS | Status: AC
  Filled 2019-07-19: qty 50

## 2019-07-19 MED ORDER — ONDANSETRON HCL 4 MG/2ML IJ SOLN
INTRAMUSCULAR | Status: AC
  Filled 2019-07-19: qty 2

## 2019-07-19 MED ORDER — OXYCODONE HCL 5 MG/5ML PO SOLN: 5.0000 mg | Freq: Once | ORAL | Status: DC | PRN

## 2019-07-19 MED ORDER — CEFAZOLIN SODIUM-DEXTROSE 2-4 GM/100ML-% IV SOLN
2.0000 g | INTRAVENOUS | Status: AC
  Administered 2019-07-19: 2 g via INTRAVENOUS

## 2019-07-19 SURGICAL SUPPLY — 36 items
BLADE HEX COATED 2.75 (ELECTRODE) ×2 IMPLANT
BLADE SURG 15 STRL LF DISP TIS (BLADE) ×1 IMPLANT
BLADE SURG 15 STRL SS (BLADE) ×1
CHLORAPREP W/TINT 26 (MISCELLANEOUS) ×2 IMPLANT
COVER BACK TABLE REUSABLE LG (DRAPES) ×2 IMPLANT
COVER MAYO STAND REUSABLE (DRAPES) ×2 IMPLANT
COVER WAND RF STERILE (DRAPES) IMPLANT
DECANTER SPIKE VIAL GLASS SM (MISCELLANEOUS) IMPLANT
DERMABOND ADVANCED (GAUZE/BANDAGES/DRESSINGS) ×1
DERMABOND ADVANCED .7 DNX12 (GAUZE/BANDAGES/DRESSINGS) ×1 IMPLANT
DRAPE LAPAROTOMY 100X72 PEDS (DRAPES) ×2 IMPLANT
DRAPE UTILITY XL STRL (DRAPES) ×2 IMPLANT
ELECT REM PT RETURN 9FT ADLT (ELECTROSURGICAL) ×2
ELECTRODE REM PT RTRN 9FT ADLT (ELECTROSURGICAL) ×1 IMPLANT
GLOVE BIOGEL PI IND STRL 6.5 (GLOVE) IMPLANT
GLOVE BIOGEL PI IND STRL 7.0 (GLOVE) IMPLANT
GLOVE BIOGEL PI INDICATOR 6.5 (GLOVE) ×1
GLOVE BIOGEL PI INDICATOR 7.0 (GLOVE) ×1
GLOVE ECLIPSE 6.5 STRL STRAW (GLOVE) ×1 IMPLANT
GLOVE SURG SIGNA 7.5 PF LTX (GLOVE) ×2 IMPLANT
GOWN STRL REUS W/ TWL LRG LVL3 (GOWN DISPOSABLE) ×1 IMPLANT
GOWN STRL REUS W/ TWL XL LVL3 (GOWN DISPOSABLE) ×1 IMPLANT
GOWN STRL REUS W/TWL LRG LVL3 (GOWN DISPOSABLE) ×1
GOWN STRL REUS W/TWL XL LVL3 (GOWN DISPOSABLE) ×1
NDL HYPO 25X1 1.5 SAFETY (NEEDLE) ×1 IMPLANT
NEEDLE HYPO 25X1 1.5 SAFETY (NEEDLE) ×2 IMPLANT
NS IRRIG 1000ML POUR BTL (IV SOLUTION) IMPLANT
PACK BASIN DAY SURGERY FS (CUSTOM PROCEDURE TRAY) ×2 IMPLANT
PENCIL BUTTON HOLSTER BLD 10FT (ELECTRODE) ×2 IMPLANT
SLEEVE SCD COMPRESS KNEE MED (MISCELLANEOUS) ×1 IMPLANT
SUT MNCRL AB 4-0 PS2 18 (SUTURE) ×2 IMPLANT
SUT VIC AB 3-0 SH 27 (SUTURE) ×1
SUT VIC AB 3-0 SH 27X BRD (SUTURE) ×1 IMPLANT
SYR BULB 3OZ (MISCELLANEOUS) IMPLANT
SYR CONTROL 10ML LL (SYRINGE) ×2 IMPLANT
TOWEL GREEN STERILE FF (TOWEL DISPOSABLE) ×2 IMPLANT

## 2019-07-19 NOTE — Op Note (Signed)
REMOVAL PORT-A-CATH  Procedure Note  Jennifer Johns 07/19/2019   Pre-op Diagnosis: HISTORY OF BREAST CANCER, PORT NO LONGER NEEDED     Post-op Diagnosis: same  Procedure(s): REMOVAL PORT-A-CATH  Surgeon(s): Coralie Keens, MD  Anesthesia: Monitor Anesthesia Care  Staff:  Circulator: Maurene Capes, RN Scrub Person: Clarice Pole  Estimated Blood Loss: Minimal               Procedure: The patient was brought to the operating room identifies correct patient.  She is placed upon the operating table and anesthesia was induced.  I anesthetized skin on the right upper chest over the previous scar the Port-A-Cath site.  I did this with 1% lidocaine.  I then made incision with a scalpel.  I tunneled down to the port which was easily identified.  I cut the sutures and was able to remove the port and the catheter in its entirety.  I then closed the tract of the catheter with a figure-of-eight 3-0 Vicryl suture.  I then closed the subcutaneous tissue with interrupted 3-0 Vicryl sutures and closed the skin with a running 4-0 Monocryl.  Dermabond was then applied.  The patient tolerated the procedure well.  All the counts were correct at the end the procedure.  The patient was then taken in a stable condition from the operating room to the recovery room.          Coralie Keens   Date: 07/19/2019  Time: 11:17 AM

## 2019-07-19 NOTE — Anesthesia Preprocedure Evaluation (Addendum)
Anesthesia Evaluation  Patient identified by MRN, date of birth, ID band Patient awake    Reviewed: Allergy & Precautions, H&P , NPO status , Patient's Chart, lab work & pertinent test results, reviewed documented beta blocker date and time   Airway Mallampati: II  TM Distance: >3 FB Neck ROM: full    Dental no notable dental hx.    Pulmonary asthma , COPD, Current Smoker, former smoker,    Pulmonary exam normal breath sounds clear to auscultation       Cardiovascular Exercise Tolerance: Good hypertension, Pt. on medications  Rhythm:regular Rate:Normal     Neuro/Psych negative neurological ROS  negative psych ROS   GI/Hepatic negative GI ROS, Neg liver ROS,   Endo/Other  negative endocrine ROS  Renal/GU negative Renal ROS  negative genitourinary   Musculoskeletal   Abdominal   Peds  Hematology negative hematology ROS (+)   Anesthesia Other Findings   Reproductive/Obstetrics negative OB ROS                             Anesthesia Physical  Anesthesia Plan  ASA: II  Anesthesia Plan: MAC   Post-op Pain Management:    Induction: Intravenous  PONV Risk Score and Plan: 3 and Treatment may vary due to age or medical condition and Ondansetron  Airway Management Planned: Nasal Cannula, Simple Face Mask and Mask  Additional Equipment:   Intra-op Plan:   Post-operative Plan: Extubation in OR  Informed Consent: I have reviewed the patients History and Physical, chart, labs and discussed the procedure including the risks, benefits and alternatives for the proposed anesthesia with the patient or authorized representative who has indicated his/her understanding and acceptance.     Dental Advisory Given  Plan Discussed with: CRNA, Anesthesiologist and Surgeon  Anesthesia Plan Comments: (  )       Anesthesia Quick Evaluation

## 2019-07-19 NOTE — Transfer of Care (Signed)
Immediate Anesthesia Transfer of Care Note  Patient: Jennifer Johns  Procedure(s) Performed: REMOVAL PORT-A-CATH (N/A Chest)  Patient Location: PACU  Anesthesia Type:MAC  Level of Consciousness: awake, alert , oriented and patient cooperative  Airway & Oxygen Therapy: Patient Spontanous Breathing  Post-op Assessment: Report given to RN and Post -op Vital signs reviewed and stable  Post vital signs: Reviewed and stable  Last Vitals:  Vitals Value Taken Time  BP 86/58 07/19/19 1112  Temp    Pulse 84 07/19/19 1114  Resp 18 07/19/19 1114  SpO2 100 % 07/19/19 1114  Vitals shown include unvalidated device data.  Last Pain:  Vitals:   07/19/19 0937  TempSrc: Oral  PainSc: 0-No pain      Patients Stated Pain Goal: 3 (89/38/10 1751)  Complications: No apparent anesthesia complications

## 2019-07-19 NOTE — Anesthesia Postprocedure Evaluation (Signed)
Anesthesia Post Note  Patient: Jennifer Johns  Procedure(s) Performed: REMOVAL PORT-A-CATH (N/A Chest)     Patient location during evaluation: PACU Anesthesia Type: MAC Level of consciousness: awake and alert Pain management: pain level controlled Vital Signs Assessment: post-procedure vital signs reviewed and stable Respiratory status: spontaneous breathing, nonlabored ventilation, respiratory function stable and patient connected to nasal cannula oxygen Cardiovascular status: stable and blood pressure returned to baseline Postop Assessment: no apparent nausea or vomiting Anesthetic complications: no    Last Vitals:  Vitals:   07/19/19 1120 07/19/19 1135  BP: 101/67 124/81  Pulse: 80 75  Resp: 20 18  Temp:  36.5 C  SpO2: 100% 99%    Last Pain:  Vitals:   07/19/19 1135  TempSrc:   PainSc: 0-No pain                 Suhana Wilner

## 2019-07-19 NOTE — Progress Notes (Signed)
Dr Eligha Bridegroom okay with previous EKG performed 06/07/2018. No EKG necessary today.

## 2019-07-19 NOTE — Interval H&P Note (Signed)
History and Physical Interval Note: no change in H and P  07/19/2019 9:27 AM  Jennifer Johns  has presented today for surgery, with the diagnosis of HX OF BREAST CANCER, PORT NO LONGER NEEDED.  The various methods of treatment have been discussed with the patient and family. After consideration of risks, benefits and other options for treatment, the patient has consented to  Procedure(s): REMOVAL PORT-A-CATH (N/A) as a surgical intervention.  The patient's history has been reviewed, patient examined, no change in status, stable for surgery.  I have reviewed the patient's chart and labs.  Questions were answered to the patient's satisfaction.     Coralie Keens

## 2019-07-19 NOTE — Discharge Instructions (Signed)
Ok to shower starting tomorrow  Ice pack, tylenol, ibuprofen also for pain  No vigorous activity for one week  No Tylenol or Ibuprofen until 5:00pm if needed

## 2019-07-23 ENCOUNTER — Encounter (HOSPITAL_BASED_OUTPATIENT_CLINIC_OR_DEPARTMENT_OTHER): Payer: Self-pay | Admitting: Surgery

## 2019-08-09 ENCOUNTER — Ambulatory Visit
Admission: RE | Admit: 2019-08-09 | Discharge: 2019-08-09 | Disposition: A | Discharge status: Home / Self Care | Payer: Medicaid Other | Source: Ambulatory Visit | Attending: Hematology | Admitting: Hematology

## 2019-08-09 ENCOUNTER — Other Ambulatory Visit: Payer: Self-pay

## 2019-08-09 DIAGNOSIS — C50112 Malignant neoplasm of central portion of left female breast: Secondary | ICD-10-CM

## 2019-08-09 DIAGNOSIS — Z171 Estrogen receptor negative status [ER-]: Secondary | ICD-10-CM

## 2019-08-09 HISTORY — DX: Personal history of antineoplastic chemotherapy: Z92.21

## 2019-08-09 HISTORY — DX: Personal history of irradiation: Z92.3

## 2019-10-30 ENCOUNTER — Inpatient Hospital Stay: Payer: Medicare Other

## 2019-10-30 ENCOUNTER — Telehealth: Payer: Self-pay | Admitting: Nurse Practitioner

## 2019-10-30 ENCOUNTER — Ambulatory Visit: Payer: Medicaid Other | Admitting: Nurse Practitioner

## 2019-10-30 ENCOUNTER — Telehealth: Payer: Self-pay

## 2019-10-30 NOTE — Telephone Encounter (Signed)
I left a message regarding reschedule °

## 2019-10-30 NOTE — Telephone Encounter (Signed)
Left voicemail for patient to call back Jennifer Johns so that I can follow up with her about missed appointments today and make sure that she is doing ok. Also sent over a schedule message to get her LAB and DOCTORS VISIT with Lacie rescheduled.

## 2019-11-06 NOTE — Progress Notes (Deleted)
[facility]   [phone] [phone]) [phone]   Clinic Follow up Note   Patient Care Team: [facility] as PCP - General Coralie Keens, MD as Consulting Physician (General Surgery) Truitt Merle, MD as Consulting Physician (Hematology) Mauro Kaufmann, RN as Oncology Nurse Navigator Rockwell Germany, RN as Oncology Nurse Navigator Alla Feeling, NP as Nurse Practitioner (Nurse Practitioner) Kyung Rudd, MD as Consulting Physician (Radiation Oncology) 11/06/2019  CHIEF COMPLAINT: F/u left breast cancer   SUMMARY OF ONCOLOGIC HISTORY: Oncology History  Cancer of central portion of left female breast ([place])  04/28/2018 Cancer Staging   Staging form: Breast, AJCC 8th Edition - Clinical stage from 04/28/2018: Stage IB (cT1c, cN0, cM0, G3, ER-, PR-, HER2-) - Signed by Truitt Merle, MD on 05/24/2018   05/24/2018 Initial Diagnosis   Cancer of central portion of left female breast ([place])   06/02/2018 Cancer Staging   Staging form: Breast, AJCC 8th Edition - Pathologic stage from 06/02/2018: Stage IIA (pT2, pN0, cM0, G3, ER-, PR-, HER2-) - Signed by Truitt Merle, MD on 06/23/2018   06/07/2018 Surgery   LEFT BREAST PARTIAL MASTECTOMY WITH AXILLARY SENTINEL LYMPH NODE BIOPSY and PAC placement by Dr. Ninfa Linden  06/07/18   06/07/2018 Pathology Results   Diagnosis 06/07/18 1. 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 - NEGATIVE FOR LYMPHOVASCULAR OR PERINEURAL INVASION. - BIOPSY SITE CHANGES. - SEE ONCOLOGY TABLE. - SEE NOTE. 2. Lymph node, sentinel, biopsy, Left Axillary - LYMPH NODE, NEGATIVE FOR CARCINOMA (0/1). 3. Lymph node, sentinel, biopsy, Left - LYMPH NODE, NEGATIVE FOR CARCINOMA (0/1). 4. Lymph node, sentinel, biopsy, Left - LYMPH NODE, NEGATIVE FOR CARCINOMA (0/1). 5. Lymph node,  sentinel, biopsy, Left - LYMPH NODE, NEGATIVE FOR CARCINOMA (0/1). 6. Lymph node, sentinel, biopsy, Left - LYMPH NODE, NEGATIVE FOR CARCINOMA (0/1). 7. Lymph node, sentinel, biopsy, Left - LYMPH NODE, NEGATIVE FOR CARCINOMA (0/1). The tumor cells are Negative for Her2 (0). Estrogen Receptor: 0%, NEGATIVE Progesterone Receptor: 0%, NEGATIVE Proliferation Marker Ki67: 40%   06/08/2018 Breast MRI   MRI Breast B/l 05/29/18 IMPRESSION: Solitary mass within the UPPER central portion of the LEFT breast, 2.3 Centimeters in maximum diameter. No MRI evidence for adenopathy. RIGHT breast is negative. LEFT BREAST PARTIAL MASTECTOMY WITH AXILLARY SENTINEL LYMPH NODE BIOPSY with PAC placement by Dr. Ninfa Linden 06/07/18   07/11/2018 Imaging   Whole Body Bone Scan 07/11/18  IMPRESSION: No scintigraphic evidence of osseous metastatic disease.   07/13/2018 -  Chemotherapy   Adjuvant AC every 2 weeks for 4 cycles, 07/13/18-08/24/18. Followed by Taxol every 2 weeks starting 09/11/18-11/30/18   08/20/2018 Imaging   CT AP W Contrast 08/20/18  IMPRESSION: No acute findings in the abdomen/pelvis. Mild prominence of the main pancreatic duct measuring 6 mm. No evidence of mass, adenopathy or ductal stones. Subtle focal nodularity over the posterior left lower lobe which may be acute or chronic and may be due to an infectious versus atypical infectious or inflammatory process. Recommend follow-up chest CT 4-6 weeks. Two subcentimeter liver hypodensities too small to characterize but likely cysts. Aortic Atherosclerosis (ICD10-I70.0).   01/15/2019 - 02/09/2019 Radiation Therapy   01/15/2019-02/09/2019:  The left breast was treated to 50.56 Gy in 16 fractions followed by a 8 Gy boost in 4 fractions.20 Per Dr. Lisbeth Renshaw   03/29/2019 Survivorship   Per Cira Rue, NP  CURRENT THERAPY: Surveillance   INTERVAL HISTORY: Jennifer Johns returns for f/u as scheduled. She was last seen by Dr. Burr Medico on 06/29/19.  Mammogram in 07/2019 was negative. She underwent colonoscopy and port a cath removal in the interval.    REVIEW OF SYSTEMS:   Constitutional: Denies fevers, chills or abnormal weight loss Eyes: Denies blurriness of vision Ears, nose, mouth, throat, and face: Denies mucositis or sore throat Respiratory: Denies cough, dyspnea or wheezes Cardiovascular: Denies palpitation, chest discomfort or lower extremity swelling Gastrointestinal:  Denies nausea, heartburn or change in bowel habits Skin: Denies abnormal skin rashes Lymphatics: Denies new lymphadenopathy or easy bruising Neurological:Denies numbness, tingling or new weaknesses Behavioral/Psych: Mood is stable, no new changes  All other systems were reviewed with the patient and are negative.  MEDICAL HISTORY:  Past Medical History:  Diagnosis Date  . Asthma   . Breast cancer ([place])    left breast  . Cancer ([place])    Left Breast  . COPD (chronic obstructive pulmonary disease) ([place])   . Hypertension   . Personal history of chemotherapy    2019  . Personal history of radiation therapy    2019    SURGICAL HISTORY: Past Surgical History:  Procedure Laterality Date  . BREAST LUMPECTOMY Left 06/07/2018   Malignant  . COLONOSCOPY N/A 03/18/2016   Procedure: COLONOSCOPY;  Surgeon: Danie Binder, MD;  [facility];  Service: Endoscopy;  Laterality: N/A;  10:30 AM  . COLONOSCOPY N/A 07/02/2019   Procedure: COLONOSCOPY;  Surgeon: Danie Binder, MD;  [facility];  Service: Endoscopy;  Laterality: N/A;  2:00  . PARTIAL MASTECTOMY WITH AXILLARY SENTINEL LYMPH NODE BIOPSY Left 06/07/2018   Procedure: LEFT BREAST PARTIAL MASTECTOMY WITH AXILLARY SENTINEL LYMPH NODE BIOPSY;  Surgeon: Coralie Keens, MD;  Location: [facility];  Service: General;  Laterality: Left;  . POLYPECTOMY  07/02/2019   Procedure: POLYPECTOMY;  Surgeon: Danie Binder, MD;  [facility];  Service: Endoscopy;;  . PORT-A-CATH REMOVAL N/A  07/19/2019   Procedure: REMOVAL PORT-A-CATH;  Surgeon: Coralie Keens, MD;  Location: [facility];  Service: General;  Laterality: N/A;  . PORTACATH PLACEMENT N/A 06/07/2018   Procedure: INSERTION PORT-A-CATH;  Surgeon: Coralie Keens, MD;  Location: [facility];  Service: General;  Laterality: N/A;    I have reviewed the social history and family history with the patient and they are unchanged from previous note.  ALLERGIES:  is allergic to carrot [daucus carota]; other; and zantac [ranitidine hcl].  MEDICATIONS:  Current Outpatient Medications  Medication Sig Dispense Refill  . albuterol (ACCUNEB) 1.25 MG/3ML nebulizer solution Take 1 ampule by nebulization every 6 (six) hours as needed for wheezing or shortness of breath.     Marland Kitchen albuterol (PROVENTIL HFA;VENTOLIN HFA) 108 (90 Base) MCG/ACT inhaler Inhale 2 puffs into the lungs every 6 (six) hours as needed for wheezing or shortness of breath.     . budesonide-formoterol (SYMBICORT) 160-4.5 MCG/ACT inhaler Inhale 2 puffs into the lungs 2 (two) times daily.    . cholecalciferol (VITAMIN D) 1000 units tablet Take 1,000 Units by mouth daily.   1  . gabapentin (NEURONTIN) 300 MG capsule Take 1 capsule (300 mg total) by mouth at bedtime. 30 capsule 3  . hydrochlorothiazide (MICROZIDE) 12.5 MG capsule Take 12.5 mg by mouth daily.    Marland Kitchen lidocaine-prilocaine (EMLA) cream Apply to affected area once (Patient taking differently: Apply 1 application topically daily as needed (port). Apply to affected area  once) 30 g 3  . lisinopril (PRINIVIL,ZESTRIL) 10 MG tablet Take 10 mg by mouth daily.    Marland Kitchen omeprazole (PRILOSEC) 40 MG capsule Take 40 mg by mouth daily.    . traMADol (ULTRAM) 50 MG tablet Take 1 tablet (50 mg total) by mouth every 6 (six) hours as needed for moderate pain. 20 tablet 0   No current facility-administered medications for this visit.    PHYSICAL EXAMINATION: ECOG PERFORMANCE STATUS: {CHL ONC ECOG PS:[id number]}  There  were no vitals filed for this visit. There were no vitals filed for this visit.  GENERAL:alert, no distress and comfortable SKIN: skin color, texture, turgor are normal, no rashes or significant lesions EYES: normal, Conjunctiva are pink and non-injected, sclera clear OROPHARYNX:no exudate, no erythema and lips, buccal mucosa, and tongue normal  NECK: supple, thyroid normal size, non-tender, without nodularity LYMPH:  no palpable lymphadenopathy in the cervical, axillary or inguinal LUNGS: clear to auscultation and percussion with normal breathing effort HEART: regular rate & rhythm and no murmurs and no lower extremity edema ABDOMEN:abdomen soft, non-tender and normal bowel sounds Musculoskeletal:no cyanosis of digits and no clubbing  NEURO: alert & oriented x 3 with fluent speech, no focal motor/sensory deficits  LABORATORY DATA:  I have reviewed the data as listed CBC Latest Ref Rng & Units 06/29/2019 03/29/2019 12/28/2018  WBC 4.0 - 10.5 K/uL 5.9 5.3 7.3  Hemoglobin 12.0 - 15.0 g/dL 12.4 11.6(L) 12.0  Hematocrit 36.0 - 46.0 % 39.9 37.0 37.6  Platelets 150 - 400 K/uL 213 179 229     CMP Latest Ref Rng & Units 06/29/2019 03/29/2019 12/28/2018  Glucose 70 - 99 mg/dL 101(H) 104(H) 90  BUN 8 - 23 mg/dL 15 17 14   Creatinine 0.44 - 1.00 mg/dL 0.94 0.92 0.91  Sodium 135 - 145 mmol/L 144 143 140  Potassium 3.5 - 5.1 mmol/L 3.9 3.7 4.0  Chloride 98 - 111 mmol/L 107 109 103  CO2 22 - 32 mmol/L 29 27 27   Calcium 8.9 - 10.3 mg/dL 10.4(H) 10.0 10.7(H)  Total Protein 6.5 - 8.1 g/dL 7.3 7.0 7.3  Total Bilirubin 0.3 - 1.2 mg/dL 0.2(L) 0.2(L) 0.3  Alkaline Phos 38 - 126 U/L 93 79 74  AST 15 - 41 U/L 17 16 16   ALT 0 - 44 U/L 14 13 12       RADIOGRAPHIC STUDIES: I have personally reviewed the radiological images as listed and agreed with the findings in the report. No results found.   ASSESSMENT & PLAN:  No problem-specific Assessment & Plan notes found for this encounter.   No orders of the  defined types were placed in this encounter.  All questions were answered. The patient knows to call the clinic with any problems, questions or concerns. No barriers to learning was detected. I spent {CHL ONC TIME VISIT - ZJIRC:[id number]} counseling the patient face to face. The total time spent in the appointment was {CHL ONC TIME VISIT - ZWCHE:[id number]} and more than 50% was on counseling and review of test results     Alla Feeling, NP 11/06/19

## 2019-11-07 ENCOUNTER — Telehealth: Payer: Self-pay | Admitting: Nurse Practitioner

## 2019-11-07 ENCOUNTER — Other Ambulatory Visit: Payer: Medicare Other

## 2019-11-07 ENCOUNTER — Ambulatory Visit: Payer: Medicaid Other | Admitting: Nurse Practitioner

## 2019-11-07 NOTE — Telephone Encounter (Signed)
Rescheduled per 2/15 sch msg, pt req. Called and spoke with Lucretia Kern (daughter), confirmed 2/25 appt

## 2019-11-14 NOTE — Progress Notes (Signed)
Jennifer Johns   Telephone:([phone] Fax:(336) [phone]   Clinic Follow up Note   Patient Care Team: [facility] as PCP - General Coralie Keens, MD as Consulting Physician (General Surgery) Truitt Merle, MD as Consulting Physician (Hematology) Mauro Kaufmann, RN as Oncology Nurse Navigator Rockwell Germany, RN as Oncology Nurse Navigator Alla Feeling, NP as Nurse Practitioner (Nurse Practitioner) Kyung Rudd, MD as Consulting Physician (Radiation Oncology) 11/15/2019  CHIEF COMPLAINT: F/u left breast cancer   SUMMARY OF ONCOLOGIC HISTORY: Oncology History  Cancer of central portion of left female breast ([place])  04/28/2018 Cancer Staging   Staging form: Breast, AJCC 8th Edition - Clinical stage from 04/28/2018: Stage IB (cT1c, cN0, cM0, G3, ER-, PR-, HER2-) - Signed by Truitt Merle, MD on 05/24/2018   05/24/2018 Initial Diagnosis   Cancer of central portion of left female breast ([place])   06/02/2018 Cancer Staging   Staging form: Breast, AJCC 8th Edition - Pathologic stage from 06/02/2018: Stage IIA (pT2, pN0, cM0, G3, ER-, PR-, HER2-) - Signed by Truitt Merle, MD on 06/23/2018   06/07/2018 Surgery   LEFT BREAST PARTIAL MASTECTOMY WITH AXILLARY SENTINEL LYMPH NODE BIOPSY and PAC placement by Dr. Ninfa Linden  06/07/18   06/07/2018 Pathology Results   Diagnosis 06/07/18 1. Breast, partial mastectomy, Left - INVASIVE DUCTAL CARCINOMA, GRADE III, 2.1 CM 1 of 4 FINAL for Jennifer Johns, Jennifer Johns [phone]) Diagnosis(continued) - SURGICAL RESECTION MARGINS ARE NEGATIVE FOR CARCINOMA. - NEGATIVE FOR LYMPHOVASCULAR OR PERINEURAL INVASION. - BIOPSY SITE CHANGES. - SEE ONCOLOGY TABLE. - SEE NOTE. 2. Lymph node, sentinel, biopsy, Left Axillary - LYMPH NODE, NEGATIVE FOR CARCINOMA (0/1). 3. Lymph node, sentinel, biopsy, Left - LYMPH NODE, NEGATIVE FOR CARCINOMA (0/1). 4. Lymph node, sentinel, biopsy, Left - LYMPH NODE, NEGATIVE FOR CARCINOMA (0/1). 5. Lymph node,  sentinel, biopsy, Left - LYMPH NODE, NEGATIVE FOR CARCINOMA (0/1). 6. Lymph node, sentinel, biopsy, Left - LYMPH NODE, NEGATIVE FOR CARCINOMA (0/1). 7. Lymph node, sentinel, biopsy, Left - LYMPH NODE, NEGATIVE FOR CARCINOMA (0/1). The tumor cells are Negative for Her2 (0). Estrogen Receptor: 0%, NEGATIVE Progesterone Receptor: 0%, NEGATIVE Proliferation Marker Ki67: 40%   06/08/2018 Breast MRI   MRI Breast B/l 05/29/18 IMPRESSION: Solitary mass within the UPPER central portion of the LEFT breast, 2.3 Centimeters in maximum diameter. No MRI evidence for adenopathy. RIGHT breast is negative. LEFT BREAST PARTIAL MASTECTOMY WITH AXILLARY SENTINEL LYMPH NODE BIOPSY with PAC placement by Dr. Ninfa Linden 06/07/18   07/11/2018 Imaging   Whole Body Bone Scan 07/11/18  IMPRESSION: No scintigraphic evidence of osseous metastatic disease.   07/13/2018 -  Chemotherapy   Adjuvant AC every 2 weeks for 4 cycles, 07/13/18-08/24/18. Followed by Taxol every 2 weeks starting 09/11/18-11/30/18   08/20/2018 Imaging   CT AP W Contrast 08/20/18  IMPRESSION: No acute findings in the abdomen/pelvis. Mild prominence of the main pancreatic duct measuring 6 mm. No evidence of mass, adenopathy or ductal stones. Subtle focal nodularity over the posterior left lower lobe which may be acute or chronic and may be due to an infectious versus atypical infectious or inflammatory process. Recommend follow-up chest CT 4-6 weeks. Two subcentimeter liver hypodensities too small to characterize but likely cysts. Aortic Atherosclerosis (ICD10-I70.0).   01/15/2019 - 02/09/2019 Radiation Therapy   01/15/2019-02/09/2019:  The left breast was treated to 50.56 Gy in 16 fractions followed by a 8 Gy boost in 4 fractions.20 Per Dr. Lisbeth Renshaw   03/29/2019 Survivorship   Per Cira Rue, NP  CURRENT THERAPY: Surveillance   INTERVAL HISTORY: Jennifer Johns returns for f/u as scheduled. She was last seen in 06/2019. She underwent  colonoscopy, mammogram, and port removal in the interim. She feels well. Energy and appetite are normal. She walks sometimes for exercise. Denies new lump/mass in her breasts or nipple discharge. Denies new bone or joint pain. Denies change in bowel habits, bloating, or abdominal pain. Denies rectal or vaginal bleeding. Asthma is at baseline. Still has residual neuropathy in feet more than hands, not limiting function or balance. Gabapentin was helpful but she ran out.    MEDICAL HISTORY:  Past Medical History:  Diagnosis Date  . Asthma   . Breast cancer ([place])    left breast  . Cancer ([place])    Left Breast  . COPD (chronic obstructive pulmonary disease) ([place])   . Hypertension   . Personal history of chemotherapy    2019  . Personal history of radiation therapy    2019    SURGICAL HISTORY: Past Surgical History:  Procedure Laterality Date  . BREAST LUMPECTOMY Left 06/07/2018   Malignant  . COLONOSCOPY N/A 03/18/2016   Procedure: COLONOSCOPY;  Surgeon: Danie Binder, MD;  [facility];  Service: Endoscopy;  Laterality: N/A;  10:30 AM  . COLONOSCOPY N/A 07/02/2019   Procedure: COLONOSCOPY;  Surgeon: Danie Binder, MD;  [facility];  Service: Endoscopy;  Laterality: N/A;  2:00  . PARTIAL MASTECTOMY WITH AXILLARY SENTINEL LYMPH NODE BIOPSY Left 06/07/2018   Procedure: LEFT BREAST PARTIAL MASTECTOMY WITH AXILLARY SENTINEL LYMPH NODE BIOPSY;  Surgeon: Coralie Keens, MD;  Location: [place];  Service: General;  Laterality: Left;  . POLYPECTOMY  07/02/2019   Procedure: POLYPECTOMY;  Surgeon: Danie Binder, MD;  [facility];  Service: Endoscopy;;  . PORT-A-CATH REMOVAL N/A 07/19/2019   Procedure: REMOVAL PORT-A-CATH;  Surgeon: Coralie Keens, MD;  Location: [place];  Service: General;  Laterality: N/A;  . PORTACATH PLACEMENT N/A 06/07/2018   Procedure: INSERTION PORT-A-CATH;  Surgeon: Coralie Keens, MD;  Location: [place];  Service:  General;  Laterality: N/A;    I have reviewed the social history and family history with the patient and they are unchanged from previous note.  ALLERGIES:  is allergic to carrot [daucus carota]; other; and zantac [ranitidine hcl].  MEDICATIONS:  Current Outpatient Medications  Medication Sig Dispense Refill  . albuterol (ACCUNEB) 1.25 MG/3ML nebulizer solution Take 1 ampule by nebulization every 6 (six) hours as needed for wheezing or shortness of breath.     Marland Kitchen albuterol (PROVENTIL HFA;VENTOLIN HFA) 108 (90 Base) MCG/ACT inhaler Inhale 2 puffs into the lungs every 6 (six) hours as needed for wheezing or shortness of breath.     . budesonide-formoterol (SYMBICORT) 160-4.5 MCG/ACT inhaler Inhale 2 puffs into the lungs 2 (two) times daily.    . cholecalciferol (VITAMIN D) 1000 units tablet Take 1,000 Units by mouth daily.   1  . gabapentin (NEURONTIN) 300 MG capsule Take 1 capsule (300 mg total) by mouth at bedtime. 90 capsule 3  . hydrochlorothiazide (MICROZIDE) 12.5 MG capsule Take 12.5 mg by mouth daily.    Marland Kitchen lisinopril (PRINIVIL,ZESTRIL) 10 MG tablet Take 10 mg by mouth daily.    Marland Kitchen omeprazole (PRILOSEC) 40 MG capsule Take 40 mg by mouth daily.    . traMADol (ULTRAM) 50 MG tablet Take 1 tablet (50 mg total) by mouth every 6 (six) hours as needed for moderate pain. 20 tablet 0  No current facility-administered medications for this visit.    PHYSICAL EXAMINATION: ECOG PERFORMANCE STATUS: 0 - Asymptomatic  Vitals:   11/15/19 0836  BP: (!) 142/98  Pulse: 100  Resp: 17  Temp: 99.2 F (37.3 C)  SpO2: 100%   Filed Weights   11/15/19 0836  Weight: 146 lb 6.4 oz (66.4 kg)    GENERAL:alert, no distress and comfortable SKIN: no rash  EYES:  sclera clear LYMPH:  no palpable cervical or supraclavicular lymphadenopathy LUNGS: distant, with normal breathing effort HEART: regular rate & rhythm, no lower extremity edema NEURO: alert & oriented x 3 with fluent speech, normal gait PAC  removed, scar healed  Breast exam: s/p left partial mastectomy. Incisions completely healed. Skin is mildly thickened. No palpable mass in either breast or axilla that I could appreciate.   LABORATORY DATA:  I have reviewed the data as listed CBC Latest Ref Rng & Units 11/15/2019 06/29/2019 03/29/2019  WBC 4.0 - 10.5 K/uL 6.4 5.9 5.3  Hemoglobin 12.0 - 15.0 g/dL 12.1 12.4 11.6(L)  Hematocrit 36.0 - 46.0 % 38.3 39.9 37.0  Platelets 150 - 400 K/uL 197 213 179     CMP Latest Ref Rng & Units 11/15/2019 06/29/2019 03/29/2019  Glucose 70 - 99 mg/dL 140(H) 101(H) 104(H)  BUN 8 - 23 mg/dL _0 Creatinine 0.44 - 1.00 mg/dL 1.14(H) 0.94 0.92  Sodium 135 - 145 mmol/L 142 144 143  Potassium 3.5 - 5.1 mmol/L 3.9 3.9 3.7  Chloride 98 - 111 mmol/L 106 107 109  CO2 22 - 32 mmol/L _1 Calcium 8.9 - 10.3 mg/dL 10.0 10.4(H) 10.0  Total Protein 6.5 - 8.1 g/dL 7.2 7.3 7.0  Total Bilirubin 0.3 - 1.2 mg/dL 0.3 0.2(L) 0.2(L)  Alkaline Phos 38 - 126 U/L 101 93 79  AST 15 - 41 U/L _2 ALT 0 - 44 U/L _3 RADIOGRAPHIC STUDIES: I have personally reviewed the radiological images as listed and agreed with the findings in the report. No results found.   ASSESSMENT & PLAN: Jennifer Johns is a 64 y.o. female with   1. 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 Treated with lumpectomy, adjuvant chemowith AC-T and Adjuvant radiation.  -I reviewed her mammogram from 07/2019 is negative -Ms. Kahan is clinically doing well. CBC is normal, CMP unremarkable except Cr 1.14. breast exam is benign. I have no clinical concern for recurrence -we reviewed s/sx of breast cancer recurrence/metastasis -she will continue surveillance and annual mammogram. She agrees to call breast center to schedule DEXA. I encouraged her to add calcium to vitamin D and increase exercise.  -we reviewed healthy diet and  lifestyle  -she will return for lab and f/u in 4 months, she knows to call sooner if she develops unexplained weight loss, fatigue, abdominal pain/bloating, new bone pain, or new concerns in her breasts.  -I encouraged her to consider getting the COVID19 vaccine when it becomes available to her. She will think about it   2.Anemia, secondary to chemo -she was not anemic before chemo -resolved   3. Peripheral neuropathy, secondary to chemo, G1 -feet > fingers, no functional limitations or altered balance -previously improved on gabapentin but she ran out -refilled, she will restart 300 mg qHS  4. COPD, HTN, health maintenance  -f/u with PCP and continue medications -she has quit smoking completely in 2019 -on lisinopril and  HCTZ, BP remains elevated -encouraged her to limit salt intake, drink more water, and exercise more, she agrees  -Ct 1.14, likely related to uncontrolled HTN -s/p colonoscopy in 06/2019 showed sessile serrated polyp, 5 year recall per GI   5. Hyperglycemia  -No known history of DM. Likely related to steroids. -improved after chemo -BG 140 today, nonfasting. She recently saw PCP, no new meds   PLAN: -Labs, mammogram reviewed  -Continue surveillance, annual mammogram in November  -Lab, f/u in 4 months  -Reviewed s/sx of recurrence, discussed healthy lifestyle and overall health maintenance  -Refilled gabapentin 300 mg qHS  No problem-specific Assessment & Plan notes found for this encounter.   No orders of the defined types were placed in this encounter.  All questions were answered. The patient knows to call the clinic with any problems, questions or concerns. No barriers to learning was detected.     Alla Feeling, NP 11/15/19

## 2019-11-15 ENCOUNTER — Inpatient Hospital Stay (HOSPITAL_BASED_OUTPATIENT_CLINIC_OR_DEPARTMENT_OTHER): Payer: Medicare Other | Admitting: Nurse Practitioner

## 2019-11-15 ENCOUNTER — Inpatient Hospital Stay: Payer: Medicare Other | Attending: Nurse Practitioner

## 2019-11-15 ENCOUNTER — Encounter: Payer: Self-pay | Admitting: Nurse Practitioner

## 2019-11-15 ENCOUNTER — Other Ambulatory Visit: Payer: Self-pay

## 2019-11-15 VITALS — BP 142/98 | HR 100 | Temp 99.2°F | Resp 17 | Ht 66.0 in | Wt 146.4 lb

## 2019-11-15 DIAGNOSIS — J449 Chronic obstructive pulmonary disease, unspecified: Secondary | ICD-10-CM | POA: Insufficient documentation

## 2019-11-15 DIAGNOSIS — D6481 Anemia due to antineoplastic chemotherapy: Secondary | ICD-10-CM | POA: Insufficient documentation

## 2019-11-15 DIAGNOSIS — C50112 Malignant neoplasm of central portion of left female breast: Secondary | ICD-10-CM | POA: Diagnosis not present

## 2019-11-15 DIAGNOSIS — G620 Drug-induced polyneuropathy: Secondary | ICD-10-CM

## 2019-11-15 DIAGNOSIS — T451X5A Adverse effect of antineoplastic and immunosuppressive drugs, initial encounter: Secondary | ICD-10-CM | POA: Diagnosis not present

## 2019-11-15 DIAGNOSIS — Z853 Personal history of malignant neoplasm of breast: Secondary | ICD-10-CM | POA: Diagnosis present

## 2019-11-15 DIAGNOSIS — Z171 Estrogen receptor negative status [ER-]: Secondary | ICD-10-CM

## 2019-11-15 DIAGNOSIS — I10 Essential (primary) hypertension: Secondary | ICD-10-CM | POA: Diagnosis not present

## 2019-11-15 LAB — CBC WITH DIFFERENTIAL (CANCER CENTER ONLY)
Abs Immature Granulocytes: 0.02 10*3/uL (ref 0.00–0.07)
Basophils Absolute: 0 10*3/uL (ref 0.0–0.1)
Basophils Relative: 1 %
Eosinophils Absolute: 0.1 10*3/uL (ref 0.0–0.5)
Eosinophils Relative: 1 %
HCT: 38.3 % (ref 36.0–46.0)
Hemoglobin: 12.1 g/dL (ref 12.0–15.0)
Immature Granulocytes: 0 %
Lymphocytes Relative: 16 %
Lymphs Abs: 1 10*3/uL (ref 0.7–4.0)
MCH: 28.5 pg (ref 26.0–34.0)
MCHC: 31.6 g/dL (ref 30.0–36.0)
MCV: 90.3 fL (ref 80.0–100.0)
Monocytes Absolute: 0.6 10*3/uL (ref 0.1–1.0)
Monocytes Relative: 10 %
Neutro Abs: 4.7 10*3/uL (ref 1.7–7.7)
Neutrophils Relative %: 72 %
Platelet Count: 197 10*3/uL (ref 150–400)
RBC: 4.24 MIL/uL (ref 3.87–5.11)
RDW: 13.2 % (ref 11.5–15.5)
WBC Count: 6.4 10*3/uL (ref 4.0–10.5)
nRBC: 0 % (ref 0.0–0.2)

## 2019-11-15 LAB — CMP (CANCER CENTER ONLY)
ALT: 16 U/L (ref 0–44)
AST: 15 U/L (ref 15–41)
Albumin: 3.6 g/dL (ref 3.5–5.0)
Alkaline Phosphatase: 101 U/L (ref 38–126)
Anion gap: 10 (ref 5–15)
BUN: 16 mg/dL (ref 8–23)
CO2: 26 mmol/L (ref 22–32)
Calcium: 10 mg/dL (ref 8.9–10.3)
Chloride: 106 mmol/L (ref 98–111)
Creatinine: 1.14 mg/dL — ABNORMAL HIGH (ref 0.44–1.00)
GFR, Est AFR Am: 58 mL/min — ABNORMAL LOW (ref >60)
GFR, Estimated: 50 mL/min — ABNORMAL LOW (ref >60)
Glucose, Bld: 140 mg/dL — ABNORMAL HIGH (ref 70–99)
Potassium: 3.9 mmol/L (ref 3.5–5.1)
Sodium: 142 mmol/L (ref 135–145)
Total Bilirubin: 0.3 mg/dL (ref 0.3–1.2)
Total Protein: 7.2 g/dL (ref 6.5–8.1)

## 2019-11-15 MED ORDER — GABAPENTIN 300 MG PO CAPS: 300.0000 mg | ORAL_CAPSULE | Freq: Every day | ORAL | 3 refills | Status: AC

## 2019-11-16 ENCOUNTER — Telehealth: Payer: Self-pay | Admitting: Nurse Practitioner

## 2019-11-16 NOTE — Telephone Encounter (Signed)
Scheduled appt per 2/25 los.  Sent a message to HIM pool to get a calendar mailed out.

## 2020-02-05 ENCOUNTER — Telehealth: Payer: Self-pay | Admitting: Hematology

## 2020-02-05 NOTE — Telephone Encounter (Signed)
Rescheduled pt appt per MD being on pal.  Left a detailed vm of the new appt date and time.

## 2020-03-14 ENCOUNTER — Other Ambulatory Visit: Payer: Medicare Other

## 2020-03-14 ENCOUNTER — Ambulatory Visit: Payer: Medicare Other | Admitting: Hematology

## 2020-03-18 NOTE — Progress Notes (Signed)
[facility]   Telephone:(336) [phone] Fax:(336) [phone]   Clinic Follow up Note   Patient Care Team: [facility] as PCP - General Coralie Keens, MD as Consulting Physician (General Surgery) Truitt Merle, MD as Consulting Physician (Hematology) Mauro Kaufmann, RN as Oncology Nurse Navigator Rockwell Germany, RN as Oncology Nurse Navigator Alla Feeling, NP as Nurse Practitioner (Nurse Practitioner) Kyung Rudd, MD as Consulting Physician (Radiation Oncology)  Date of Service:  03/21/2020  CHIEF COMPLAINT: F/u on left breast cancer  SUMMARY OF ONCOLOGIC HISTORY: Oncology History  Cancer of central portion of left female breast ([place])  04/28/2018 Cancer Staging   Staging form: Breast, AJCC 8th Edition - Clinical stage from 04/28/2018: Stage IB (cT1c, cN0, cM0, G3, ER-, PR-, HER2-) - Signed by Truitt Merle, MD on 05/24/2018   05/24/2018 Initial Diagnosis   Cancer of central portion of left female breast ([place])   06/02/2018 Cancer Staging   Staging form: Breast, AJCC 8th Edition - Pathologic stage from 06/02/2018: Stage IIA (pT2, pN0, cM0, G3, ER-, PR-, HER2-) - Signed by Truitt Merle, MD on 06/23/2018   06/07/2018 Surgery   LEFT BREAST PARTIAL MASTECTOMY WITH AXILLARY SENTINEL LYMPH NODE BIOPSY and PAC placement by Dr. Ninfa Linden  06/07/18   06/07/2018 Pathology Results   Diagnosis 06/07/18 1. Breast, partial mastectomy, Left - INVASIVE DUCTAL CARCINOMA, GRADE III, 2.1 CM 1 of 4 FINAL for Jennifer Johns J [phone]) Diagnosis(continued) - SURGICAL RESECTION MARGINS ARE NEGATIVE FOR CARCINOMA. - NEGATIVE FOR LYMPHOVASCULAR OR PERINEURAL INVASION. - BIOPSY SITE CHANGES. - SEE ONCOLOGY TABLE. - SEE NOTE. 2. Lymph node, sentinel, biopsy, Left Axillary - LYMPH NODE, NEGATIVE FOR CARCINOMA (0/1). 3. Lymph node, sentinel, biopsy, Left - LYMPH NODE, NEGATIVE FOR CARCINOMA (0/1). 4. Lymph node, sentinel, biopsy, Left - LYMPH NODE, NEGATIVE FOR CARCINOMA  (0/1). 5. Lymph node, sentinel, biopsy, Left - LYMPH NODE, NEGATIVE FOR CARCINOMA (0/1). 6. Lymph node, sentinel, biopsy, Left - LYMPH NODE, NEGATIVE FOR CARCINOMA (0/1). 7. Lymph node, sentinel, biopsy, Left - LYMPH NODE, NEGATIVE FOR CARCINOMA (0/1). The tumor cells are Negative for Her2 (0). Estrogen Receptor: 0%, NEGATIVE Progesterone Receptor: 0%, NEGATIVE Proliferation Marker Ki67: 40%   06/08/2018 Breast MRI   MRI Breast B/l 05/29/18 IMPRESSION: Solitary mass within the UPPER central portion of the LEFT breast, 2.3 Centimeters in maximum diameter. No MRI evidence for adenopathy. RIGHT breast is negative. LEFT BREAST PARTIAL MASTECTOMY WITH AXILLARY SENTINEL LYMPH NODE BIOPSY with PAC placement by Dr. Ninfa Linden 06/07/18   07/11/2018 Imaging   Whole Body Bone Scan 07/11/18  IMPRESSION: No scintigraphic evidence of osseous metastatic disease.   07/13/2018 -  Chemotherapy   Adjuvant AC every 2 weeks for 4 cycles, 07/13/18-08/24/18. Followed by Taxol every 2 weeks starting 09/11/18-11/30/18   08/20/2018 Imaging   CT AP W Contrast 08/20/18  IMPRESSION: No acute findings in the abdomen/pelvis. Mild prominence of the main pancreatic duct measuring 6 mm. No evidence of mass, adenopathy or ductal stones. Subtle focal nodularity over the posterior left lower lobe which may be acute or chronic and may be due to an infectious versus atypical infectious or inflammatory process. Recommend follow-up chest CT 4-6 weeks. Two subcentimeter liver hypodensities too small to characterize but likely cysts. Aortic Atherosclerosis (ICD10-I70.0).   01/15/2019 - 02/09/2019 Radiation Therapy   01/15/2019-02/09/2019:  The left breast was treated to 50.56 Gy in 16 fractions followed by a 8 Gy boost in 4 fractions.20 Per Dr. Lisbeth Renshaw   03/29/2019 Survivorship  Per Cira Rue, NP       CURRENT THERAPY:  Surveillance  INTERVAL HISTORY:  Jennifer Johns is here for a follow up of left breast cancer.  She was last seen by me 9 months ago and seen by NP Lacie 4 months ago. She presents to the clinic alone. She notes she is doing well. She notes still having left ball of foot pain from neuropathy. She is able to ambulate adequately. She also has neuropathy on her fingers mildly with no loss of function. She denies any other pain. She note she is eating adequately and has normal BMs. She feels like except neuropathy she has recovered well from chemo and back at baseline. She last saw her PCP 2 weeks ago.     REVIEW OF SYSTEMS:   Constitutional: Denies fevers, chills or abnormal weight loss Eyes: Denies blurriness of vision Ears, nose, mouth, throat, and face: Denies mucositis or sore throat Respiratory: Denies cough, dyspnea or wheezes Cardiovascular: Denies palpitation, chest discomfort or lower extremity swelling Gastrointestinal:  Denies nausea, heartburn or change in bowel habits Skin: Denies abnormal skin rashes Lymphatics: Denies new lymphadenopathy or easy bruising Neurological: (+) Neuropathy in ball of left foot and fingers, manageable.  Behavioral/Psych: Mood is stable, no new changes  All other systems were reviewed with the patient and are negative.  MEDICAL HISTORY:  Past Medical History:  Diagnosis Date   Asthma    Breast cancer ([place])    left breast   Cancer ([place])    Left Breast   COPD (chronic obstructive pulmonary disease) ([place])    Hypertension    Personal history of chemotherapy    2019   Personal history of radiation therapy    2019    SURGICAL HISTORY: Past Surgical History:  Procedure Laterality Date   BREAST LUMPECTOMY Left 06/07/2018   Malignant   COLONOSCOPY N/A 03/18/2016   Procedure: COLONOSCOPY;  Surgeon: Danie Binder, MD;  [facility];  Service: Endoscopy;  Laterality: N/A;  10:30 AM   COLONOSCOPY N/A 07/02/2019   Procedure: COLONOSCOPY;  Surgeon: Danie Binder, MD;  [facility];  Service: Endoscopy;  Laterality:  N/A;  2:00   PARTIAL MASTECTOMY WITH AXILLARY SENTINEL LYMPH NODE BIOPSY Left 06/07/2018   Procedure: LEFT BREAST PARTIAL MASTECTOMY WITH AXILLARY SENTINEL LYMPH NODE BIOPSY;  Surgeon: Coralie Keens, MD;  Location: [place] [facility];  Service: General;  Laterality: Left;   POLYPECTOMY  07/02/2019   Procedure: POLYPECTOMY;  Surgeon: Danie Binder, MD;  [facility];  Service: Endoscopy;;   PORT-A-CATH REMOVAL N/A 07/19/2019   Procedure: REMOVAL PORT-A-CATH;  Surgeon: Coralie Keens, MD;  Location: [facility];  Service: General;  Laterality: N/A;   PORTACATH PLACEMENT N/A 06/07/2018   Procedure: INSERTION PORT-A-CATH;  Surgeon: Coralie Keens, MD;  Location: [facility];  Service: General;  Laterality: N/A;    I have reviewed the social history and family history with the patient and they are unchanged from previous note.  ALLERGIES:  is allergic to carrot [daucus carota], other, and zantac [ranitidine hcl].  MEDICATIONS:  Current Outpatient Medications  Medication Sig Dispense Refill   albuterol (ACCUNEB) 1.25 MG/3ML nebulizer solution Take 1 ampule by nebulization every 6 (six) hours as needed for wheezing or shortness of breath.      albuterol (PROVENTIL HFA;VENTOLIN HFA) 108 (90 Base) MCG/ACT inhaler Inhale 2 puffs into the lungs every 6 (six) hours as needed for wheezing or shortness of breath.  budesonide-formoterol (SYMBICORT) 160-4.5 MCG/ACT inhaler Inhale 2 puffs into the lungs 2 (two) times daily.     cetirizine (ZYRTEC) 10 MG tablet      cholecalciferol (VITAMIN D) 1000 units tablet Take 1,000 Units by mouth daily.   1   gabapentin (NEURONTIN) 300 MG capsule Take 1 capsule (300 mg total) by mouth at bedtime. 90 capsule 3   hydrochlorothiazide (MICROZIDE) 12.5 MG capsule Take 12.5 mg by mouth daily.     Ipratropium-Albuterol (ALBUTEROL-IPRATROPIUM IN)      lisinopril (PRINIVIL,ZESTRIL) 10 MG tablet Take 10 mg by mouth daily.     metFORMIN  (GLUCOPHAGE) 500 MG tablet      omeprazole (PRILOSEC) 40 MG capsule Take 40 mg by mouth daily.     predniSONE (STERAPRED UNI-PAK 21 TAB) 10 MG (21) TBPK tablet Take by mouth.     traMADol (ULTRAM) 50 MG tablet Take 1 tablet (50 mg total) by mouth every 6 (six) hours as needed for moderate pain. 20 tablet 0   No current facility-administered medications for this visit.    PHYSICAL EXAMINATION: ECOG PERFORMANCE STATUS: 0 - Asymptomatic  Vitals:   03/21/20 0953  BP: 135/85  Pulse: 100  Resp: 18  Temp: 97.8 F (36.6 C)  SpO2: 100%   Filed Weights   03/21/20 0953  Weight: 149 lb 12.8 oz (67.9 kg)    GENERAL:alert, no distress and comfortable SKIN: skin color, texture, turgor are normal, no rashes or significant lesions EYES: normal, Conjunctiva are pink and non-injected, sclera clear  NECK: supple, thyroid normal size, non-tender, without nodularity LYMPH:  no palpable lymphadenopathy in the cervical, axillary  LUNGS: clear to auscultation and percussion with normal breathing effort HEART: regular rate & rhythm and no murmurs and no lower extremity edema ABDOMEN:abdomen soft, non-tender and normal bowel sounds Musculoskeletal:no cyanosis of digits and no clubbing  NEURO: alert & oriented x 3 with fluent speech, no focal motor/sensory deficits BREAST:S/p Left lumpectomy: Surgical incision healed well with mild scar tissue (+) Skin hyperpigmentation of left breast from RT. No palpable mass, nodules or adenopathy bilaterally. Breast exam benign.   LABORATORY DATA:  I have reviewed the data as listed CBC Latest Ref Rng & Units 03/21/2020 11/15/2019 06/29/2019  WBC 4.0 - 10.5 K/uL 7.2 6.4 5.9  Hemoglobin 12.0 - 15.0 g/dL 12.7 12.1 12.4  Hematocrit 36 - 46 % 40.7 38.3 39.9  Platelets 150 - 400 K/uL 182 197 213     CMP Latest Ref Rng & Units 03/21/2020 11/15/2019 06/29/2019  Glucose 70 - 99 mg/dL 154(H) 140(H) 101(H)  BUN 8 - 23 mg/dL 18 16 15   Creatinine 0.44 - 1.00 mg/dL 1.23(H)  1.14(H) 0.94  Sodium 135 - 145 mmol/L 141 142 144  Potassium 3.5 - 5.1 mmol/L 3.8 3.9 3.9  Chloride 98 - 111 mmol/L 106 106 107  CO2 22 - 32 mmol/L 25 26 29   Calcium 8.9 - 10.3 mg/dL 10.8(H) 10.0 10.4(H)  Total Protein 6.5 - 8.1 g/dL 7.2 7.2 7.3  Total Bilirubin 0.3 - 1.2 mg/dL 0.3 0.3 0.2(L)  Alkaline Phos 38 - 126 U/L 91 101 93  AST 15 - 41 U/L 17 15 17   ALT 0 - 44 U/L 17 16 14       RADIOGRAPHIC STUDIES: I have personally reviewed the radiological images as listed and agreed with the findings in the report. No results found.   ASSESSMENT & PLAN:  MAUDENE STOTLER is a 64 y.o. female with    1. Cancer of central  portion of left breast, invasive ductal carcinoma,pT2N0M0, stageIIA,grade 3, ER-/PR-/HER2- -Diagnosed in 04/2018. Treated with left lumpectomy, adjuvant chemowith AC-T and Adjuvant radiation.  -She is clinically doing well. Lab reviewed, her CBC and CMP are within normal limits except BG 154, Cr 1.23, Ca 10.8. Her physical exam and her 07/2019 mammogram were unremarkable. There is no clinical concern for recurrence. -She previously had PAC removed on 07/19/19.  -Continue Surveillance. Next mammogram in 07/2020. Will obtain new baseline DEXA at same time. She is agreeable.  -F/u in 6 months    2. Peripheral neuropathy, secondary to chemo, G1 -She mainly has residual neuropathy in ball of left foot but also has mild neuropathy in her fingers. She still has adequate ambulation and function of hands and feet. This is manageable.  -She has Gabapentin 373m once daily which is helpful.   3. COPD, HTN  -f/u with PCP and continue medications, well controlled  -she has quit smoking completely in 2019  4. Elevated Cr -Her Cr has been elevated in the past 4 months. Cr at 1.23 today (03/21/20). I recommend she drink more water and avoid NSAIDs. She will f/u with her PCP on this   -Will monitor.   Plan -DEXA and Mammogram in 07/2020  -Lab and f/u in 6 months   No  problem-specific Assessment & Plan notes found for this encounter.   Orders Placed This Encounter  Procedures   MM DIAG BREAST TOMO BILATERAL    Standing Status:   Future    Standing Expiration Date:   03/21/2021    Order Specific Question:   Reason for Exam (SYMPTOM  OR DIAGNOSIS REQUIRED)    Answer:   screening    Order Specific Question:   [facility]    [facility]  All questions were answered. The patient knows to call the clinic with any problems, questions or concerns. No barriers to learning was detected. The total time spent in the appointment was 25 minutes.     YTruitt Merle MD 03/21/2020   I, AJoslyn Devon am acting as scribe for YTruitt Merle MD.   I have reviewed the above documentation for accuracy and completeness, and I agree with the above.

## 2020-03-21 ENCOUNTER — Inpatient Hospital Stay: Payer: Medicare Other | Attending: Hematology

## 2020-03-21 ENCOUNTER — Encounter: Payer: Self-pay | Admitting: Hematology

## 2020-03-21 ENCOUNTER — Inpatient Hospital Stay (HOSPITAL_BASED_OUTPATIENT_CLINIC_OR_DEPARTMENT_OTHER): Payer: Medicare Other | Admitting: Hematology

## 2020-03-21 ENCOUNTER — Telehealth: Payer: Self-pay | Admitting: Hematology

## 2020-03-21 ENCOUNTER — Other Ambulatory Visit: Payer: Self-pay

## 2020-03-21 VITALS — BP 135/85 | HR 100 | Temp 97.8°F | Resp 18 | Ht 66.0 in | Wt 149.8 lb

## 2020-03-21 DIAGNOSIS — Z171 Estrogen receptor negative status [ER-]: Secondary | ICD-10-CM

## 2020-03-21 DIAGNOSIS — Z853 Personal history of malignant neoplasm of breast: Secondary | ICD-10-CM | POA: Diagnosis present

## 2020-03-21 DIAGNOSIS — C50112 Malignant neoplasm of central portion of left female breast: Secondary | ICD-10-CM

## 2020-03-21 DIAGNOSIS — G629 Polyneuropathy, unspecified: Secondary | ICD-10-CM | POA: Insufficient documentation

## 2020-03-21 DIAGNOSIS — I10 Essential (primary) hypertension: Secondary | ICD-10-CM | POA: Diagnosis not present

## 2020-03-21 DIAGNOSIS — J449 Chronic obstructive pulmonary disease, unspecified: Secondary | ICD-10-CM | POA: Insufficient documentation

## 2020-03-21 LAB — CBC WITH DIFFERENTIAL (CANCER CENTER ONLY)
Abs Immature Granulocytes: 0.02 10*3/uL (ref 0.00–0.07)
Basophils Absolute: 0 10*3/uL (ref 0.0–0.1)
Basophils Relative: 1 %
Eosinophils Absolute: 0.1 10*3/uL (ref 0.0–0.5)
Eosinophils Relative: 1 %
HCT: 40.7 % (ref 36.0–46.0)
Hemoglobin: 12.7 g/dL (ref 12.0–15.0)
Immature Granulocytes: 0 %
Lymphocytes Relative: 14 %
Lymphs Abs: 1 10*3/uL (ref 0.7–4.0)
MCH: 28.6 pg (ref 26.0–34.0)
MCHC: 31.2 g/dL (ref 30.0–36.0)
MCV: 91.7 fL (ref 80.0–100.0)
Monocytes Absolute: 0.5 10*3/uL (ref 0.1–1.0)
Monocytes Relative: 8 %
Neutro Abs: 5.5 10*3/uL (ref 1.7–7.7)
Neutrophils Relative %: 76 %
Platelet Count: 182 10*3/uL (ref 150–400)
RBC: 4.44 MIL/uL (ref 3.87–5.11)
RDW: 13.1 % (ref 11.5–15.5)
WBC Count: 7.2 10*3/uL (ref 4.0–10.5)
nRBC: 0 % (ref 0.0–0.2)

## 2020-03-21 LAB — CMP (CANCER CENTER ONLY)
ALT: 17 U/L (ref 0–44)
AST: 17 U/L (ref 15–41)
Albumin: 3.5 g/dL (ref 3.5–5.0)
Alkaline Phosphatase: 91 U/L (ref 38–126)
Anion gap: 10 (ref 5–15)
BUN: 18 mg/dL (ref 8–23)
CO2: 25 mmol/L (ref 22–32)
Calcium: 10.8 mg/dL — ABNORMAL HIGH (ref 8.9–10.3)
Chloride: 106 mmol/L (ref 98–111)
Creatinine: 1.23 mg/dL — ABNORMAL HIGH (ref 0.44–1.00)
GFR, Est AFR Am: 53 mL/min — ABNORMAL LOW (ref >60)
GFR, Estimated: 46 mL/min — ABNORMAL LOW (ref >60)
Glucose, Bld: 154 mg/dL — ABNORMAL HIGH (ref 70–99)
Potassium: 3.8 mmol/L (ref 3.5–5.1)
Sodium: 141 mmol/L (ref 135–145)
Total Bilirubin: 0.3 mg/dL (ref 0.3–1.2)
Total Protein: 7.2 g/dL (ref 6.5–8.1)

## 2020-03-21 NOTE — Telephone Encounter (Signed)
Scheduled per 07/02 los, patient has received updated calender.

## 2020-07-01 IMAGING — NM NM BONE WHOLE BODY
2 series · 2 of 2 positions shown · non-contrast
Comparison: None

Radiographic correlation: Chest radiograph 06/07/2018

CLINICAL DATA: Breast cancer, staging

EXAM:
NUCLEAR MEDICINE WHOLE BODY BONE SCAN
TECHNIQUE: Whole body anterior and posterior images were obtained approximately
3 hours after intravenous injection of radiopharmaceutical.
RADIOPHARMACEUTICALS:  21 mCi Qechnetium-66m MDP IV

[Series 1: whole body · 2.66mm/px · 1 of 1 slices shown (1 of 2)]
[im 1/1]
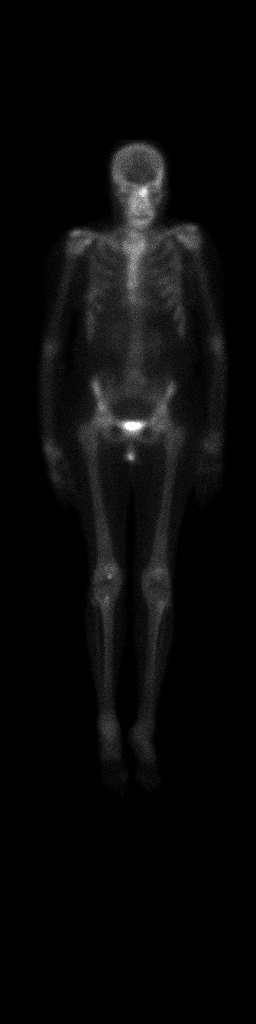

[Series 1: whole body · 2.66mm/px · 1 of 1 slices shown (2 of 2)]
[im 1/1]
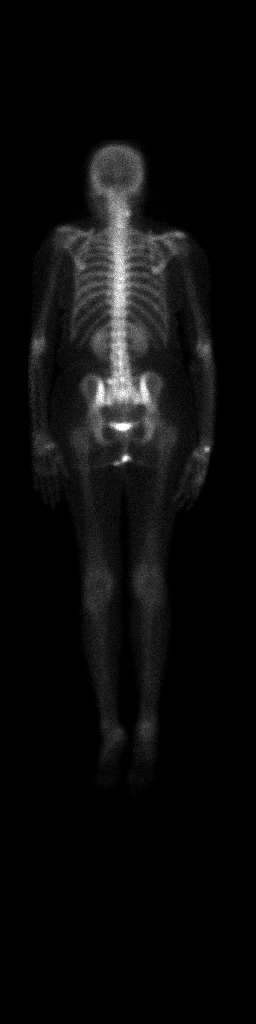

[2 of 2 positions shown; findings below may reference images not displayed]

FINDINGS: Uptake at the shoulders, RIGHT wrist, RIGHT knee, LEFT lateral
aspect of lower lumbar spine at L5-S1, and RIGHT lateral aspect of
cervical spine, typically degenerative.

No additional sites of abnormal osseous tracer accumulation are
identified to suggest osseous metastatic disease.

Expected urinary tract and soft tissue distribution of tracer.
IMPRESSION: No scintigraphic evidence of osseous metastatic disease.

## 2020-08-10 IMAGING — CT CT ABD-PELV W/ CM
2 of 5 series · 16 of 46 positions shown, 18 images · IV contrast (ISOVUE)
Comparison: None.

CLINICAL DATA: Weakness with generalized abdominal pain, diarrhea
and chills 4 days. Last chemotherapy treatment [DATE]st for
breast cancer.

EXAM:
CT ABDOMEN AND PELVIS WITH CONTRAST
TECHNIQUE: Multidetector CT imaging of the abdomen and pelvis was performed
using the standard protocol following bolus administration of
intravenous contrast.
CONTRAST:  100mL [id number] IOPAMIDOL ([id number]) INJECTION 61%

[Series 2: axial st · axial · 0.67mm/px · z∈[-469,-114]mm · 13 of 83 slices shown, 15 images]
[im 6/83  soft-tissue]
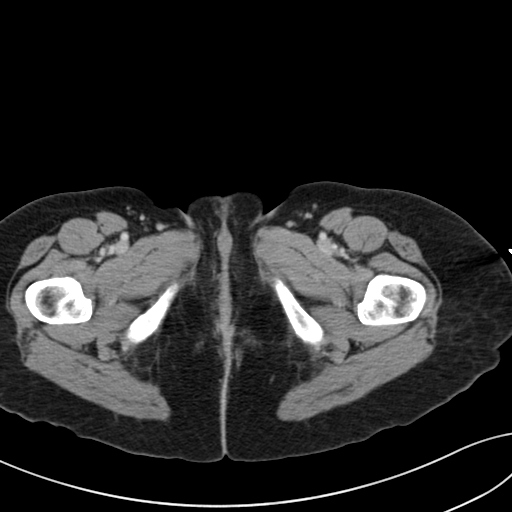
[im 6/83  bone]
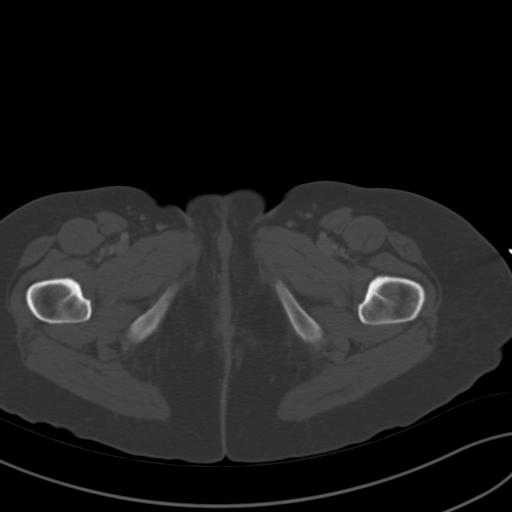
[im 11/83  soft-tissue]
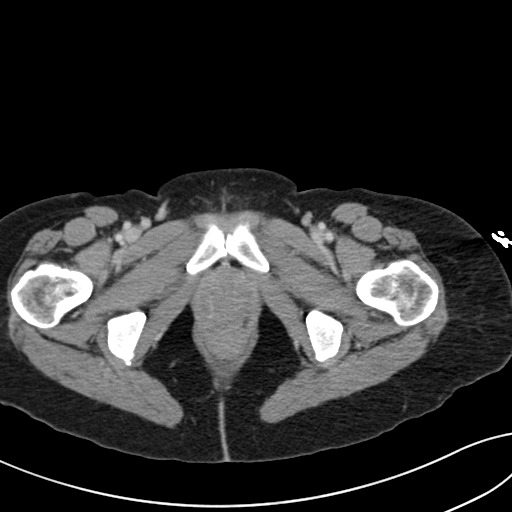
[im 16/83  soft-tissue]
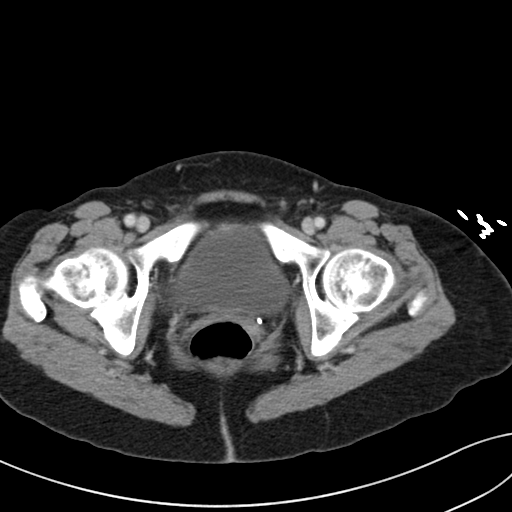
[im 26/83  soft-tissue]
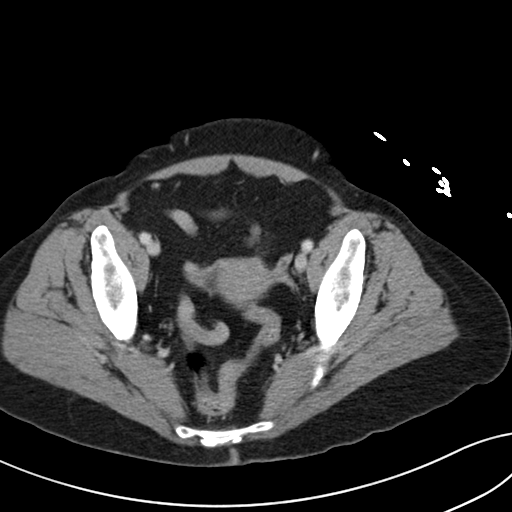
[im 31/83  soft-tissue]
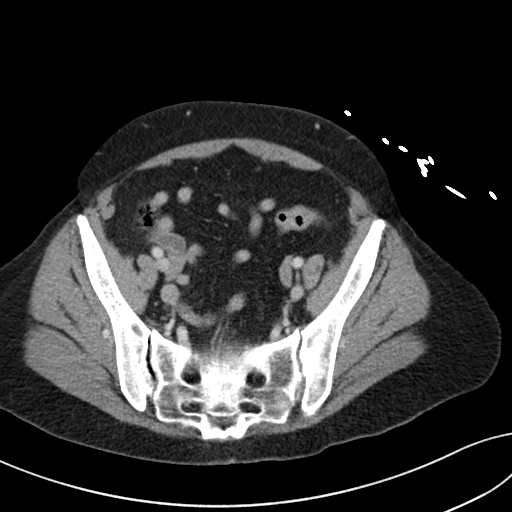
[im 36/83  soft-tissue]
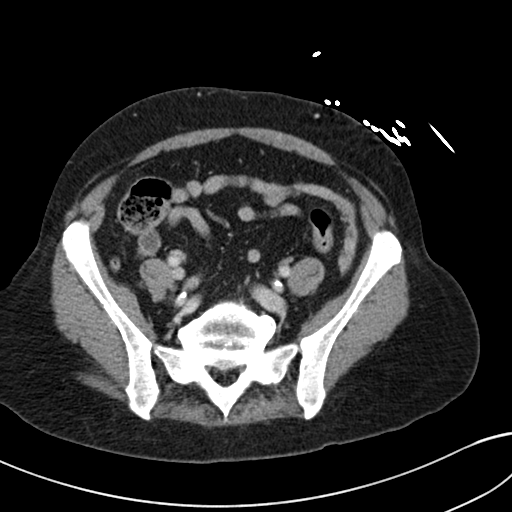
[im 42/83  soft-tissue]
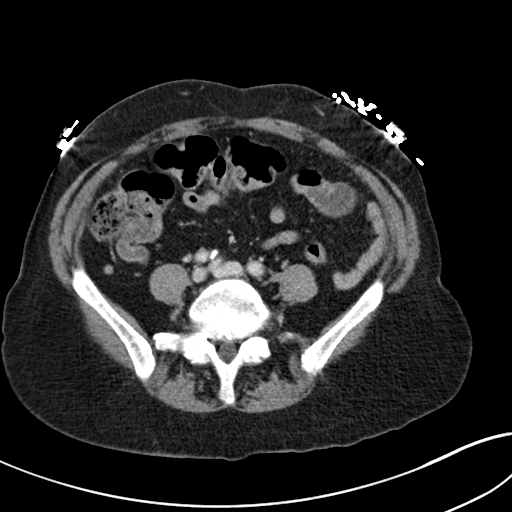
[im 47/83  soft-tissue]
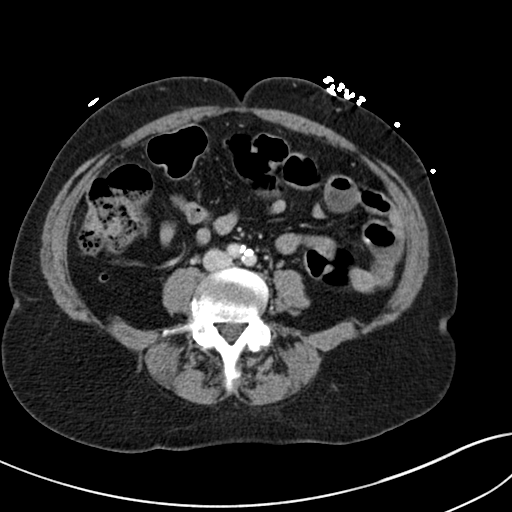
[im 52/83  soft-tissue]
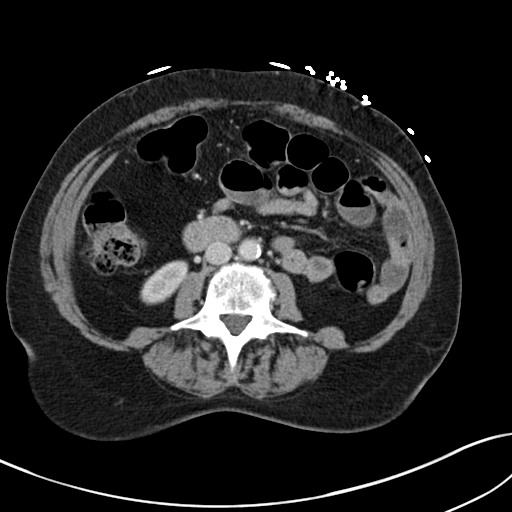
[im 52/83  bone]
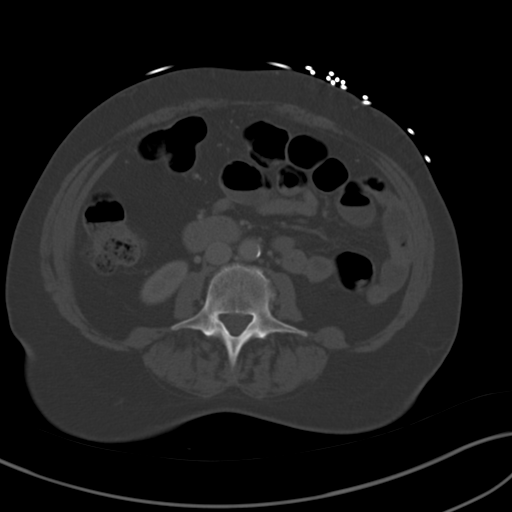
[im 57/83  soft-tissue]
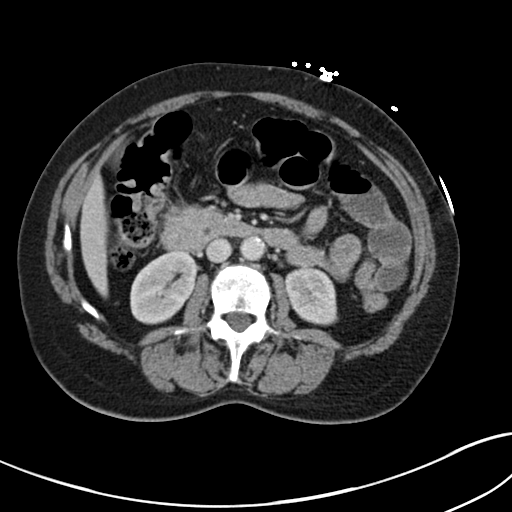
[im 67/83  soft-tissue]
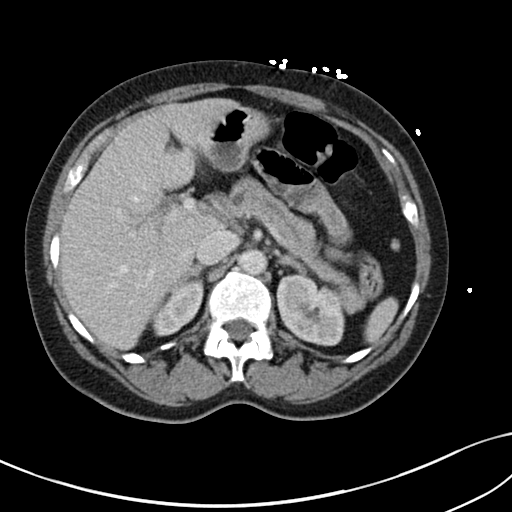
[im 72/83  soft-tissue]
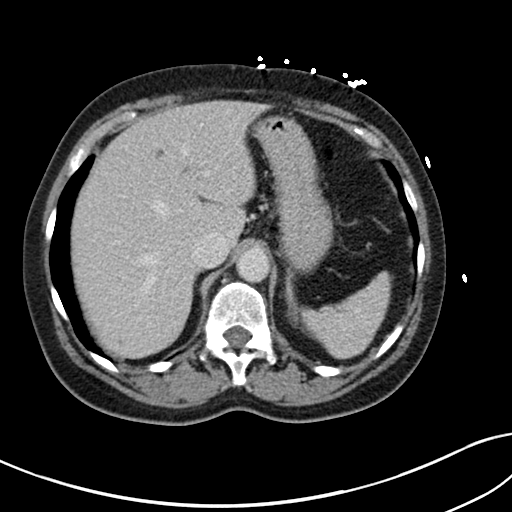
[im 77/83  soft-tissue]
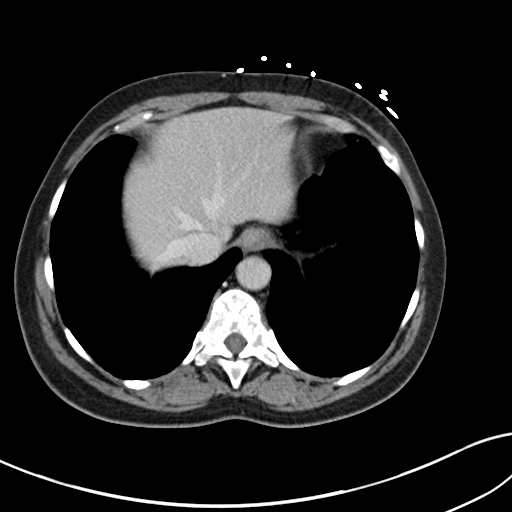

[Series 5: coronal st · coronal · 0.63mm/px · 3 of 99 slices shown]
[im 33/99  soft-tissue]
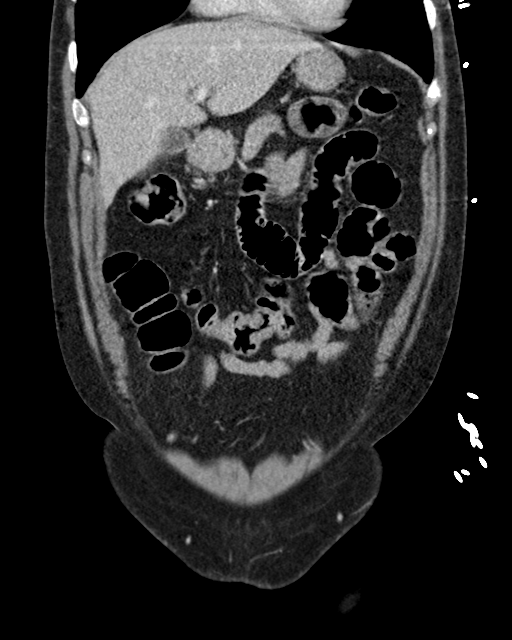
[im 44/99  soft-tissue]
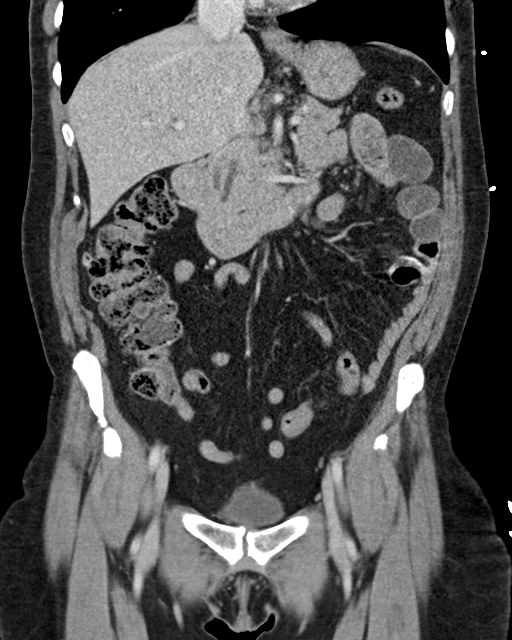
[im 55/99  soft-tissue]
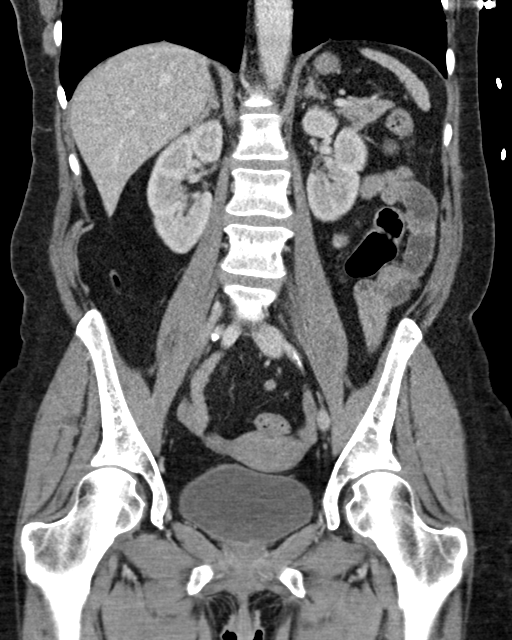

[16 of 46 positions shown; findings below may reference images not displayed]

FINDINGS: Lower chest: Subtle focal nodular opacification of the posterior
left lower lobe. Calcified granuloma over the left lower lobe.

Hepatobiliary: There are a couple subcentimeter liver hypodensities
too small to characterize but likely cysts. Gallbladder and biliary
tree are normal.

Pancreas: Mild dilatation of the main pancreatic duct over the
pancreatic head measuring up to 6 mm. No focal mass or ductal stones
identified.

Spleen: Normal.

Adrenals/Urinary Tract: Adrenal glands are normal. Kidneys normal in
size without hydronephrosis or nephrolithiasis. Ureters and bladder
are normal.

Stomach/Bowel: Stomach and small bowel are within normal. Appendix
is normal. Colon is normal.

Vascular/Lymphatic: Calcified plaque over the abdominal aorta. No
adenopathy.

Reproductive: Normal.

Other: No significant free fluid or focal inflammatory change.

Musculoskeletal: Mild degenerative change of the spine with disc
disease at the L4-5 and L5-S1 levels.
IMPRESSION: No acute findings in the abdomen/pelvis.

Mild prominence of the main pancreatic duct measuring 6 mm. No
evidence of mass, adenopathy or ductal stones.

Subtle focal nodularity over the posterior left lower lobe which may
be acute or chronic and may be due to an infectious versus atypical
infectious or inflammatory process. Recommend follow-up chest CT 4-6
weeks.

Two subcentimeter liver hypodensities too small to characterize but
likely cysts.

Aortic Atherosclerosis (ET6V8-YJD.D).

## 2020-08-26 ENCOUNTER — Other Ambulatory Visit: Payer: Self-pay

## 2020-08-26 ENCOUNTER — Ambulatory Visit
Admission: RE | Admit: 2020-08-26 | Discharge: 2020-08-26 | Disposition: A | Discharge status: Home / Self Care | Payer: Medicare Other | Source: Ambulatory Visit | Attending: Hematology | Admitting: Hematology

## 2020-08-26 DIAGNOSIS — Z171 Estrogen receptor negative status [ER-]: Secondary | ICD-10-CM

## 2020-08-26 DIAGNOSIS — C50112 Malignant neoplasm of central portion of left female breast: Secondary | ICD-10-CM

## 2020-09-23 NOTE — Progress Notes (Deleted)
[place]   Telephone:(336) [phone] Fax:(336) [phone]   Clinic Follow up Note   Patient Care Team: [facility] as PCP - General Coralie Keens, MD as Consulting Physician (General Surgery) Truitt Merle, MD as Consulting Physician (Hematology) Mauro Kaufmann, RN as Oncology Nurse Navigator Rockwell Germany, RN as Oncology Nurse Navigator Alla Feeling, NP as Nurse Practitioner (Nurse Practitioner) Kyung Rudd, MD as Consulting Physician (Radiation Oncology) 09/23/2020  CHIEF COMPLAINT: Follow up left breast cancer   SUMMARY OF ONCOLOGIC HISTORY: Oncology History  Cancer of central portion of left female breast ([place])  04/28/2018 Cancer Staging   Staging form: Breast, AJCC 8th Edition - Clinical stage from 04/28/2018: Stage IB (cT1c, cN0, cM0, G3, ER-, PR-, HER2-) - Signed by Truitt Merle, MD on 05/24/2018   05/24/2018 Initial Diagnosis   Cancer of central portion of left female breast ([place])   06/02/2018 Cancer Staging   Staging form: Breast, AJCC 8th Edition - Pathologic stage from 06/02/2018: Stage IIA (pT2, pN0, cM0, G3, ER-, PR-, HER2-) - Signed by Truitt Merle, MD on 06/23/2018   06/07/2018 Surgery   LEFT BREAST PARTIAL MASTECTOMY WITH AXILLARY SENTINEL LYMPH NODE BIOPSY and PAC placement by Dr. Ninfa Linden  06/07/18   06/07/2018 Pathology Results   Diagnosis 06/07/18 1. 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 - NEGATIVE FOR LYMPHOVASCULAR OR PERINEURAL INVASION. - BIOPSY SITE CHANGES. - SEE ONCOLOGY TABLE. - SEE NOTE. 2. Lymph node, sentinel, biopsy, Left Axillary - LYMPH NODE, NEGATIVE FOR CARCINOMA (0/1). 3. Lymph node, sentinel, biopsy, Left - LYMPH NODE, NEGATIVE FOR CARCINOMA (0/1). 4. Lymph node, sentinel, biopsy, Left - LYMPH NODE, NEGATIVE FOR CARCINOMA (0/1). 5. Lymph  node, sentinel, biopsy, Left - LYMPH NODE, NEGATIVE FOR CARCINOMA (0/1). 6. Lymph node, sentinel, biopsy, Left - LYMPH NODE, NEGATIVE FOR CARCINOMA (0/1). 7. Lymph node, sentinel, biopsy, Left - LYMPH NODE, NEGATIVE FOR CARCINOMA (0/1). The tumor cells are Negative for Her2 (0). Estrogen Receptor: 0%, NEGATIVE Progesterone Receptor: 0%, NEGATIVE Proliferation Marker Ki67: 40%   06/08/2018 Breast MRI   MRI Breast B/l 05/29/18 IMPRESSION: Solitary mass within the UPPER central portion of the LEFT breast, 2.3 Centimeters in maximum diameter. No MRI evidence for adenopathy. RIGHT breast is negative. LEFT BREAST PARTIAL MASTECTOMY WITH AXILLARY SENTINEL LYMPH NODE BIOPSY with PAC placement by Dr. Ninfa Linden 06/07/18   07/11/2018 Imaging   Whole Body Bone Scan 07/11/18  IMPRESSION: No scintigraphic evidence of osseous metastatic disease.   07/13/2018 -  Chemotherapy   Adjuvant AC every 2 weeks for 4 cycles, 07/13/18-08/24/18. Followed by Taxol every 2 weeks starting 09/11/18-11/30/18   08/20/2018 Imaging   CT AP W Contrast 08/20/18  IMPRESSION: No acute findings in the abdomen/pelvis. Mild prominence of the main pancreatic duct measuring 6 mm. No evidence of mass, adenopathy or ductal stones. Subtle focal nodularity over the posterior left lower lobe which may be acute or chronic and may be due to an infectious versus atypical infectious or inflammatory process. Recommend follow-up chest CT 4-6 weeks. Two subcentimeter liver hypodensities too small to characterize but likely cysts. Aortic Atherosclerosis (ICD10-I70.0).   01/15/2019 - 02/09/2019 Radiation Therapy   01/15/2019-02/09/2019:  The left breast was treated to 50.56 Gy in 16 fractions followed by a 8 Gy boost in 4 fractions.20 Per Dr. Lisbeth Renshaw   03/29/2019 Survivorship   Per Cira Rue,  NP      CURRENT THERAPY: Surveillance   INTERVAL HISTORY: Ms. Hakanson returns for follow up as scheduled. She was last seen 03/21/20. Mammogram  08/26/20 was negative.    REVIEW OF SYSTEMS:   Constitutional: Denies fevers, chills or abnormal weight loss Eyes: Denies blurriness of vision Ears, nose, mouth, throat, and face: Denies mucositis or sore throat Respiratory: Denies cough, dyspnea or wheezes Cardiovascular: Denies palpitation, chest discomfort or lower extremity swelling Gastrointestinal:  Denies nausea, heartburn or change in bowel habits Skin: Denies abnormal skin rashes Lymphatics: Denies new lymphadenopathy or easy bruising Neurological:Denies numbness, tingling or new weaknesses Behavioral/Psych: Mood is stable, no new changes  All other systems were reviewed with the patient and are negative.  MEDICAL HISTORY:  Past Medical History:  Diagnosis Date  . Asthma   . Breast cancer ([place])    left breast  . Cancer ([place])    Left Breast  . COPD (chronic obstructive pulmonary disease) ([place])   . Hypertension   . Personal history of chemotherapy    2019  . Personal history of radiation therapy    2019    SURGICAL HISTORY: Past Surgical History:  Procedure Laterality Date  . BREAST BIOPSY Left   . BREAST LUMPECTOMY Left 06/07/2018   Malignant  . COLONOSCOPY N/A 03/18/2016   Procedure: COLONOSCOPY;  Surgeon: Danie Binder, MD;  [facility];  Service: Endoscopy;  Laterality: N/A;  10:30 AM  . COLONOSCOPY N/A 07/02/2019   Procedure: COLONOSCOPY;  Surgeon: Danie Binder, MD;  [facility];  Service: Endoscopy;  Laterality: N/A;  2:00  . PARTIAL MASTECTOMY WITH AXILLARY SENTINEL LYMPH NODE BIOPSY Left 06/07/2018   Procedure: LEFT BREAST PARTIAL MASTECTOMY WITH AXILLARY SENTINEL LYMPH NODE BIOPSY;  Surgeon: Coralie Keens, MD;  Location: [facility];  Service: General;  Laterality: Left;  . POLYPECTOMY  07/02/2019   Procedure: POLYPECTOMY;  Surgeon: Danie Binder, MD;  [facility];  Service: Endoscopy;;  . PORT-A-CATH REMOVAL N/A 07/19/2019   Procedure: REMOVAL PORT-A-CATH;  Surgeon:  Coralie Keens, MD;  Location: [facility];  Service: General;  Laterality: N/A;  . PORTACATH PLACEMENT N/A 06/07/2018   Procedure: INSERTION PORT-A-CATH;  Surgeon: Coralie Keens, MD;  Location: [facility];  Service: General;  Laterality: N/A;    I have reviewed the social history and family history with the patient and they are unchanged from previous note.  ALLERGIES:  is allergic to carrot [daucus carota], other, and zantac [ranitidine hcl].  MEDICATIONS:  Current Outpatient Medications  Medication Sig Dispense Refill  . albuterol (ACCUNEB) 1.25 MG/3ML nebulizer solution Take 1 ampule by nebulization every 6 (six) hours as needed for wheezing or shortness of breath.     Marland Kitchen albuterol (PROVENTIL HFA;VENTOLIN HFA) 108 (90 Base) MCG/ACT inhaler Inhale 2 puffs into the lungs every 6 (six) hours as needed for wheezing or shortness of breath.     . budesonide-formoterol (SYMBICORT) 160-4.5 MCG/ACT inhaler Inhale 2 puffs into the lungs 2 (two) times daily.    . cetirizine (ZYRTEC) 10 MG tablet     . cholecalciferol (VITAMIN D) 1000 units tablet Take 1,000 Units by mouth daily.   1  . gabapentin (NEURONTIN) 300 MG capsule Take 1 capsule (300 mg total) by mouth at bedtime. 90 capsule 3  . hydrochlorothiazide (MICROZIDE) 12.5 MG capsule Take 12.5 mg by mouth daily.    . Ipratropium-Albuterol (ALBUTEROL-IPRATROPIUM IN)     . lisinopril (PRINIVIL,ZESTRIL) 10 MG tablet Take 10 mg  by mouth daily.    . metFORMIN (GLUCOPHAGE) 500 MG tablet     . omeprazole (PRILOSEC) 40 MG capsule Take 40 mg by mouth daily.    . predniSONE (STERAPRED UNI-PAK 21 TAB) 10 MG (21) TBPK tablet Take by mouth.    . traMADol (ULTRAM) 50 MG tablet Take 1 tablet (50 mg total) by mouth every 6 (six) hours as needed for moderate pain. 20 tablet 0   No current facility-administered medications for this visit.    PHYSICAL EXAMINATION: ECOG PERFORMANCE STATUS: {CHL ONC ECOG PS:[id number]}  There were no vitals filed  for this visit. There were no vitals filed for this visit.  GENERAL:alert, no distress and comfortable SKIN: skin color, texture, turgor are normal, no rashes or significant lesions EYES: normal, Conjunctiva are pink and non-injected, sclera clear OROPHARYNX:no exudate, no erythema and lips, buccal mucosa, and tongue normal  NECK: supple, thyroid normal size, non-tender, without nodularity LYMPH:  no palpable lymphadenopathy in the cervical, axillary or inguinal LUNGS: clear to auscultation and percussion with normal breathing effort HEART: regular rate & rhythm and no murmurs and no lower extremity edema ABDOMEN:abdomen soft, non-tender and normal bowel sounds Musculoskeletal:no cyanosis of digits and no clubbing  NEURO: alert & oriented x 3 with fluent speech, no focal motor/sensory deficits  LABORATORY DATA:  I have reviewed the data as listed CBC Latest Ref Rng & Units 03/21/2020 11/15/2019 06/29/2019  WBC 4.0 - 10.5 K/uL 7.2 6.4 5.9  Hemoglobin 12.0 - 15.0 g/dL 12.7 12.1 12.4  Hematocrit 36.0 - 46.0 % 40.7 38.3 39.9  Platelets 150 - 400 K/uL 182 197 213     CMP Latest Ref Rng & Units 03/21/2020 11/15/2019 06/29/2019  Glucose 70 - 99 mg/dL 154(H) 140(H) 101(H)  BUN 8 - 23 mg/dL 18 16 15   Creatinine 0.44 - 1.00 mg/dL 1.23(H) 1.14(H) 0.94  Sodium 135 - 145 mmol/L 141 142 144  Potassium 3.5 - 5.1 mmol/L 3.8 3.9 3.9  Chloride 98 - 111 mmol/L 106 106 107  CO2 22 - 32 mmol/L 25 26 29   Calcium 8.9 - 10.3 mg/dL 10.8(H) 10.0 10.4(H)  Total Protein 6.5 - 8.1 g/dL 7.2 7.2 7.3  Total Bilirubin 0.3 - 1.2 mg/dL 0.3 0.3 0.2(L)  Alkaline Phos 38 - 126 U/L 91 101 93  AST 15 - 41 U/L 17 15 17   ALT 0 - 44 U/L 17 16 14       RADIOGRAPHIC STUDIES: I have personally reviewed the radiological images as listed and agreed with the findings in the report. No results found.   ASSESSMENT & PLAN:  No problem-specific Assessment & Plan notes found for this encounter.   No orders of the defined types  were placed in this encounter.  All questions were answered. The patient knows to call the clinic with any problems, questions or concerns. No barriers to learning was detected. I spent {CHL ONC TIME VISIT - HYQMV:[id number]} counseling the patient face to face. The total time spent in the appointment was {CHL ONC TIME VISIT - WUXLK:[id number]} and more than 50% was on counseling and review of test results     Alla Feeling, NP 09/23/20

## 2020-09-25 ENCOUNTER — Inpatient Hospital Stay: Payer: Medicare Other | Admitting: Nurse Practitioner

## 2020-09-25 ENCOUNTER — Inpatient Hospital Stay: Payer: Medicare Other | Attending: General Practice

## 2020-10-31 ENCOUNTER — Telehealth: Payer: Self-pay | Admitting: Hematology

## 2020-10-31 NOTE — Telephone Encounter (Signed)
Called pt per 2/10 sch msg - unable to reach pt . Unable to leave message.

## 2020-11-07 NOTE — Progress Notes (Signed)
Jennifer Johns   Telephone:(336) [phone] Fax:(336) [phone]   Clinic Follow up Note   Patient Care Team: [facility] [place] as PCP - General Coralie Keens, MD as Consulting Physician (General Surgery) Truitt Merle, MD as Consulting Physician (Hematology) Mauro Kaufmann, RN as Oncology Nurse Navigator Rockwell Germany, RN as Oncology Nurse Navigator Alla Feeling, NP as Nurse Practitioner (Nurse Practitioner) Kyung Rudd, MD as Consulting Physician (Radiation Oncology)  Date of Service:  11/10/2020  CHIEF COMPLAINT: F/u on left breast cancer  SUMMARY OF ONCOLOGIC HISTORY: Oncology History Overview Note  Cancer Staging Cancer of central portion of left female breast [facility]) Staging form: Breast, AJCC 8th Edition - Clinical stage from 04/28/2018: Stage IB (cT1c, cN0, cM0, G3, ER-, PR-, HER2-) - Signed by Truitt Merle, MD on 05/24/2018 Histologic grading system: 3 grade system - Pathologic stage from 06/02/2018: Stage IIA (pT2, pN0, cM0, G3, ER-, PR-, HER2-) - Signed by Truitt Merle, MD on 06/23/2018 Neoadjuvant therapy: No Nuclear grade: G3 Multigene prognostic tests performed: None Histologic grading system: 3 grade system Residual tumor (R): R0 - None Laterality: Left    Cancer of central portion of left female breast ([place])  04/28/2018 Cancer Staging   Staging form: Breast, AJCC 8th Edition - Clinical stage from 04/28/2018: Stage IB (cT1c, cN0, cM0, G3, ER-, PR-, HER2-) - Signed by Truitt Merle, MD on 05/24/2018   05/24/2018 Initial Diagnosis   Cancer of central portion of left female breast ([place])   06/02/2018 Cancer Staging   Staging form: Breast, AJCC 8th Edition - Pathologic stage from 06/02/2018: Stage IIA (pT2, pN0, cM0, G3, ER-, PR-, HER2-) - Signed by Truitt Merle, MD on 06/23/2018   06/07/2018 Surgery   LEFT BREAST PARTIAL MASTECTOMY WITH AXILLARY SENTINEL LYMPH NODE BIOPSY and [place] placement by Dr. Ninfa Linden  06/07/18   06/07/2018 Pathology Results    Diagnosis 06/07/18 1. Breast, partial mastectomy, Left - INVASIVE DUCTAL CARCINOMA, GRADE III, 2.1 CM 1 of 4 FINAL for Jennifer Johns, Jennifer Johns [phone]) Diagnosis(continued) - SURGICAL RESECTION MARGINS ARE NEGATIVE FOR CARCINOMA. - NEGATIVE FOR LYMPHOVASCULAR OR PERINEURAL INVASION. - BIOPSY SITE CHANGES. - SEE ONCOLOGY TABLE. - SEE NOTE. 2. Lymph node, sentinel, biopsy, Left Axillary - LYMPH NODE, NEGATIVE FOR CARCINOMA (0/1). 3. Lymph node, sentinel, biopsy, Left - LYMPH NODE, NEGATIVE FOR CARCINOMA (0/1). 4. Lymph node, sentinel, biopsy, Left - LYMPH NODE, NEGATIVE FOR CARCINOMA (0/1). 5. Lymph node, sentinel, biopsy, Left - LYMPH NODE, NEGATIVE FOR CARCINOMA (0/1). 6. Lymph node, sentinel, biopsy, Left - LYMPH NODE, NEGATIVE FOR CARCINOMA (0/1). 7. Lymph node, sentinel, biopsy, Left - LYMPH NODE, NEGATIVE FOR CARCINOMA (0/1). The tumor cells are Negative for Her2 (0). Estrogen Receptor: 0%, NEGATIVE Progesterone Receptor: 0%, NEGATIVE Proliferation Marker Ki67: 40%   06/08/2018 Breast MRI   MRI Breast B/l 05/29/18 IMPRESSION: Solitary mass within the UPPER central portion of the LEFT breast, 2.3 Centimeters in maximum diameter. No MRI evidence for adenopathy. RIGHT breast is negative. LEFT BREAST PARTIAL MASTECTOMY WITH AXILLARY SENTINEL LYMPH NODE BIOPSY with PAC placement by Dr. Ninfa Linden 06/07/18   07/11/2018 Imaging   Whole Body Bone Scan 07/11/18  IMPRESSION: No scintigraphic evidence of osseous metastatic disease.   07/13/2018 -  Chemotherapy   Adjuvant AC every 2 weeks for 4 cycles, 07/13/18-08/24/18. Followed by Taxol every 2 weeks starting 09/11/18-11/30/18   08/20/2018 Imaging   CT AP W Contrast 08/20/18  IMPRESSION: No acute findings in the abdomen/pelvis. Mild prominence of the main pancreatic duct measuring  6 mm. No evidence of mass, adenopathy or ductal stones. Subtle focal nodularity over the posterior left lower lobe which may be acute or chronic and may be  due to an infectious versus atypical infectious or inflammatory process. Recommend follow-up chest CT 4-6 weeks. Two subcentimeter liver hypodensities too small to characterize but likely cysts. Aortic Atherosclerosis (ICD10-I70.0).   01/15/2019 - 02/09/2019 Radiation Therapy   01/15/2019-02/09/2019:  The left breast was treated to 50.56 Gy in 16 fractions followed by a 8 Gy boost in 4 fractions.20 Per Dr. Lisbeth Renshaw   03/29/2019 Survivorship   Per Cira Rue, NP    07/19/2019 Procedure   She previously had PAC removed on 07/19/19.       CURRENT THERAPY:  Surveillance  INTERVAL HISTORY:  Jennifer Johns is here for a follow up of left breast cancer. She was last seen by me 7 months ago. She presents to the clinic alone. She notes she is doing well. She notes having occasional feet and hand tingling. She this not consistent. She denies change in function.     REVIEW OF SYSTEMS:   Constitutional: Denies fevers, chills or abnormal weight loss Eyes: Denies blurriness of vision Ears, nose, mouth, throat, and face: Denies mucositis or sore throat Respiratory: Denies cough, dyspnea or wheezes Cardiovascular: Denies palpitation, chest discomfort or lower extremity swelling Gastrointestinal:  Denies nausea, heartburn or change in bowel habits Skin: Denies abnormal skin rashes Lymphatics: Denies new lymphadenopathy or easy bruising Neurological: (+) improved mild and intermittent tingling in her feet and hands.  Behavioral/Psych: Mood is stable, no new changes  All other systems were reviewed with the patient and are negative.  MEDICAL HISTORY:  Past Medical History:  Diagnosis Date  . Asthma   . Breast cancer ([place])    left breast  . Cancer ([place])    Left Breast  . COPD (chronic obstructive pulmonary disease) ([place])   . Hypertension   . Personal history of chemotherapy    2019  . Personal history of radiation therapy    2019    SURGICAL HISTORY: Past Surgical History:  Procedure  Laterality Date  . BREAST BIOPSY Left   . BREAST LUMPECTOMY Left 06/07/2018   Malignant  . COLONOSCOPY N/A 03/18/2016   Procedure: COLONOSCOPY;  Surgeon: Danie Binder, MD;  [facility];  Service: Endoscopy;  Laterality: N/A;  10:30 AM  . COLONOSCOPY N/A 07/02/2019   Procedure: COLONOSCOPY;  Surgeon: Danie Binder, MD;  [facility];  Service: Endoscopy;  Laterality: N/A;  2:00  . PARTIAL MASTECTOMY WITH AXILLARY SENTINEL LYMPH NODE BIOPSY Left 06/07/2018   Procedure: LEFT BREAST PARTIAL MASTECTOMY WITH AXILLARY SENTINEL LYMPH NODE BIOPSY;  Surgeon: Coralie Keens, MD;  Location: [place];  Service: General;  Laterality: Left;  . POLYPECTOMY  07/02/2019   Procedure: POLYPECTOMY;  Surgeon: Danie Binder, MD;  [facility];  Service: Endoscopy;;  . PORT-A-CATH REMOVAL N/A 07/19/2019   Procedure: REMOVAL PORT-A-CATH;  Surgeon: Coralie Keens, MD;  Location: [place];  Service: General;  Laterality: N/A;  . PORTACATH PLACEMENT N/A 06/07/2018   Procedure: INSERTION PORT-A-CATH;  Surgeon: Coralie Keens, MD;  Location: [place];  Service: General;  Laterality: N/A;    I have reviewed the social history and family history with the patient and they are unchanged from previous note.  ALLERGIES:  is allergic to carrot [daucus carota], other, and zantac [ranitidine hcl].  MEDICATIONS:  Current Outpatient Medications  Medication Sig Dispense Refill  .  albuterol (ACCUNEB) 1.25 MG/3ML nebulizer solution Take 1 ampule by nebulization every 6 (six) hours as needed for wheezing or shortness of breath.     Marland Kitchen albuterol (PROVENTIL HFA;VENTOLIN HFA) 108 (90 Base) MCG/ACT inhaler Inhale 2 puffs into the lungs every 6 (six) hours as needed for wheezing or shortness of breath.     . budesonide-formoterol (SYMBICORT) 160-4.5 MCG/ACT inhaler Inhale 2 puffs into the lungs 2 (two) times daily.    . cetirizine (ZYRTEC) 10 MG tablet     . cholecalciferol  (VITAMIN D) 1000 units tablet Take 1,000 Units by mouth daily.   1  . gabapentin (NEURONTIN) 300 MG capsule Take 1 capsule (300 mg total) by mouth at bedtime. 90 capsule 3  . hydrochlorothiazide (MICROZIDE) 12.5 MG capsule Take 12.5 mg by mouth daily.    . Ipratropium-Albuterol (ALBUTEROL-IPRATROPIUM IN)     . lisinopril (PRINIVIL,ZESTRIL) 10 MG tablet Take 10 mg by mouth daily.    . metFORMIN (GLUCOPHAGE) 500 MG tablet     . omeprazole (PRILOSEC) 40 MG capsule Take 40 mg by mouth daily.    . predniSONE (STERAPRED UNI-PAK 21 TAB) 10 MG (21) TBPK tablet Take by mouth.    . traMADol (ULTRAM) 50 MG tablet Take 1 tablet (50 mg total) by mouth every 6 (six) hours as needed for moderate pain. 20 tablet 0   No current facility-administered medications for this visit.    PHYSICAL EXAMINATION: ECOG PERFORMANCE STATUS: 0 - Asymptomatic  Vitals:   11/10/20 1405  BP: 128/78  Pulse: 82  Resp: 16  Temp: 98.7 F (37.1 C)  SpO2: 98%   Filed Weights   11/10/20 1405  Weight: 148 lb 6.4 oz (67.3 kg)    GENERAL:alert, no distress and comfortable SKIN: skin color, texture, turgor are normal, no rashes or significant lesions EYES: normal, Conjunctiva are pink and non-injected, sclera clear  NECK: supple, thyroid normal size, non-tender, without nodularity LYMPH:  no palpable lymphadenopathy in the cervical, axillary  LUNGS: clear to auscultation and percussion with normal breathing effort HEART: regular rate & rhythm and no murmurs and no lower extremity edema ABDOMEN:abdomen soft, non-tender and normal bowel sounds Musculoskeletal:no cyanosis of digits and no clubbing  NEURO: alert & oriented x 3 with fluent speech, no focal motor/sensory deficits BREAST: S/p left lumpectomy: Surgical incision healed well. (+) mild left breast lymphedema. No palpable mass, nodules or adenopathy bilaterally. Breast exam benign.   LABORATORY DATA:  I have reviewed the data as listed CBC Latest Ref Rng & Units  11/10/2020 03/21/2020 11/15/2019  WBC 4.0 - 10.5 K/uL 6.0 7.2 6.4  Hemoglobin 12.0 - 15.0 g/dL 11.9(L) 12.7 12.1  Hematocrit 36.0 - 46.0 % 38.0 40.7 38.3  Platelets 150 - 400 K/uL 199 182 197     CMP Latest Ref Rng & Units 11/10/2020 03/21/2020 11/15/2019  Glucose 70 - 99 mg/dL 111(H) 154(H) 140(H)  BUN 8 - 23 mg/dL _0 Creatinine 0.44 - 1.00 mg/dL 1.12(H) 1.23(H) 1.14(H)  Sodium 135 - 145 mmol/L 142 141 142  Potassium 3.5 - 5.1 mmol/L 4.1 3.8 3.9  Chloride 98 - 111 mmol/L 106 106 106  CO2 22 - 32 mmol/L _1 Calcium 8.9 - 10.3 mg/dL 10.2 10.8(H) 10.0  Total Protein 6.5 - 8.1 g/dL 6.9 7.2 7.2  Total Bilirubin 0.3 - 1.2 mg/dL 0.2(L) 0.3 0.3  Alkaline Phos 38 - 126 U/L 91 91 101  AST 15 - 41 U/L 13(L) 17 15  ALT 0 -  44 U/L _0 RADIOGRAPHIC STUDIES: I have personally reviewed the radiological images as listed and agreed with the findings in the report. No results found.   ASSESSMENT & PLAN:  Jennifer Johns is a 65 y.o. female with    1. 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 Treated with left lumpectomy,adjuvant chemowith AC-TandAdjuvant radiation. -She is on cancer surveillance. -She is clinically doing well. Lab reviewed, her CBC and CMP are within normal limits except Hg 11.9, BG 111, Cr 1.12. Her physical exam and her 08/2020 mammogram were unremarkable except mild left breast lymphedema. There is no clinical concern for recurrence. -Continue Surveillance. Next mammogram in 08/2021. She has not proceeded with baseline DEXA.  -F/u in 6 months with NP Lacie  2. Peripheral neuropathy, secondary to chemo, G1 -Continues to improve. She has mild tingling in her feet and hands with no decreased function. Manageable -She has Gabapentin 379m once daily which is helpful.   3. COPD, HTN  -f/u with PCP and continue medications, well controlled  -she has quit  smoking completely in 2019  4. Elevated Cr -Her Cr has been elevated since 10/2019. I recommend she drink more water and continue to manage her HTN.    Plan -Continue breast cancer surveillance.   -Lab and f/u in 6 months with NP Lacie  -Bone density scan in the next months   No problem-specific Assessment & Plan notes found for this encounter.   Orders Placed This Encounter  Procedures  . DG Bone Density    Standing Status:   Future    Standing Expiration Date:   11/10/2021    Order Specific Question:   Reason for Exam (SYMPTOM  OR DIAGNOSIS REQUIRED)    Answer:   screening    Order Specific Question:   [facility]    [facility]  All questions were answered. The patient knows to call the clinic with any problems, questions or concerns. No barriers to learning was detected. The total time spent in the appointment was 25 minutes.     YTruitt Merle MD 11/10/2020   I, AJoslyn Devon am acting as scribe for YTruitt Merle MD.   I have reviewed the above documentation for accuracy and completeness, and I agree with the above.

## 2020-11-10 ENCOUNTER — Inpatient Hospital Stay: Payer: Medicare Other | Attending: Hematology

## 2020-11-10 ENCOUNTER — Other Ambulatory Visit: Payer: Self-pay

## 2020-11-10 ENCOUNTER — Encounter: Payer: Self-pay | Admitting: Hematology

## 2020-11-10 ENCOUNTER — Inpatient Hospital Stay (HOSPITAL_BASED_OUTPATIENT_CLINIC_OR_DEPARTMENT_OTHER): Payer: Medicare Other | Admitting: Hematology

## 2020-11-10 VITALS — BP 128/78 | HR 82 | Temp 98.7°F | Resp 16 | Ht 66.0 in | Wt 148.4 lb

## 2020-11-10 DIAGNOSIS — Z171 Estrogen receptor negative status [ER-]: Secondary | ICD-10-CM | POA: Insufficient documentation

## 2020-11-10 DIAGNOSIS — Z7952 Long term (current) use of systemic steroids: Secondary | ICD-10-CM | POA: Insufficient documentation

## 2020-11-10 DIAGNOSIS — C50112 Malignant neoplasm of central portion of left female breast: Secondary | ICD-10-CM

## 2020-11-10 DIAGNOSIS — Z79899 Other long term (current) drug therapy: Secondary | ICD-10-CM | POA: Diagnosis not present

## 2020-11-10 DIAGNOSIS — Z7951 Long term (current) use of inhaled steroids: Secondary | ICD-10-CM | POA: Insufficient documentation

## 2020-11-10 DIAGNOSIS — Z7984 Long term (current) use of oral hypoglycemic drugs: Secondary | ICD-10-CM | POA: Insufficient documentation

## 2020-11-10 DIAGNOSIS — E2839 Other primary ovarian failure: Secondary | ICD-10-CM | POA: Diagnosis not present

## 2020-11-10 DIAGNOSIS — I10 Essential (primary) hypertension: Secondary | ICD-10-CM | POA: Diagnosis not present

## 2020-11-10 DIAGNOSIS — J449 Chronic obstructive pulmonary disease, unspecified: Secondary | ICD-10-CM | POA: Diagnosis not present

## 2020-11-10 DIAGNOSIS — Z87891 Personal history of nicotine dependence: Secondary | ICD-10-CM | POA: Insufficient documentation

## 2020-11-10 LAB — CBC WITH DIFFERENTIAL (CANCER CENTER ONLY)
Abs Immature Granulocytes: 0 10*3/uL (ref 0.00–0.07)
Basophils Absolute: 0.1 10*3/uL (ref 0.0–0.1)
Basophils Relative: 1 %
Eosinophils Absolute: 0.1 10*3/uL (ref 0.0–0.5)
Eosinophils Relative: 1 %
HCT: 38 % (ref 36.0–46.0)
Hemoglobin: 11.9 g/dL — ABNORMAL LOW (ref 12.0–15.0)
Immature Granulocytes: 0 %
Lymphocytes Relative: 24 %
Lymphs Abs: 1.4 10*3/uL (ref 0.7–4.0)
MCH: 28.5 pg (ref 26.0–34.0)
MCHC: 31.3 g/dL (ref 30.0–36.0)
MCV: 91.1 fL (ref 80.0–100.0)
Monocytes Absolute: 0.6 10*3/uL (ref 0.1–1.0)
Monocytes Relative: 11 %
Neutro Abs: 3.8 10*3/uL (ref 1.7–7.7)
Neutrophils Relative %: 63 %
Platelet Count: 199 10*3/uL (ref 150–400)
RBC: 4.17 MIL/uL (ref 3.87–5.11)
RDW: 13.3 % (ref 11.5–15.5)
WBC Count: 6 10*3/uL (ref 4.0–10.5)
nRBC: 0 % (ref 0.0–0.2)

## 2020-11-10 LAB — CMP (CANCER CENTER ONLY)
ALT: 12 U/L (ref 0–44)
AST: 13 U/L — ABNORMAL LOW (ref 15–41)
Albumin: 3.6 g/dL (ref 3.5–5.0)
Alkaline Phosphatase: 91 U/L (ref 38–126)
Anion gap: 9 (ref 5–15)
BUN: 18 mg/dL (ref 8–23)
CO2: 27 mmol/L (ref 22–32)
Calcium: 10.2 mg/dL (ref 8.9–10.3)
Chloride: 106 mmol/L (ref 98–111)
Creatinine: 1.12 mg/dL — ABNORMAL HIGH (ref 0.44–1.00)
GFR, Estimated: 54 mL/min — ABNORMAL LOW (ref >60)
Glucose, Bld: 111 mg/dL — ABNORMAL HIGH (ref 70–99)
Potassium: 4.1 mmol/L (ref 3.5–5.1)
Sodium: 142 mmol/L (ref 135–145)
Total Bilirubin: 0.2 mg/dL — ABNORMAL LOW (ref 0.3–1.2)
Total Protein: 6.9 g/dL (ref 6.5–8.1)

## 2020-11-11 ENCOUNTER — Telehealth: Payer: Self-pay | Admitting: Hematology

## 2020-11-11 NOTE — Telephone Encounter (Signed)
Left message with follow-up appointment per 2/21 los. Gave option to call back to reschedule if needed.

## 2021-04-17 ENCOUNTER — Other Ambulatory Visit: Payer: Self-pay

## 2021-04-17 ENCOUNTER — Encounter: Payer: Self-pay | Admitting: Hematology

## 2021-04-17 ENCOUNTER — Ambulatory Visit
Admission: RE | Admit: 2021-04-17 | Discharge: 2021-04-17 | Disposition: A | Discharge status: Home / Self Care | Payer: Medicare Other | Source: Ambulatory Visit | Attending: Hematology | Admitting: Hematology

## 2021-04-17 DIAGNOSIS — E2839 Other primary ovarian failure: Secondary | ICD-10-CM

## 2021-05-10 NOTE — Progress Notes (Deleted)
[place]   Telephone:(336) [phone] Fax:(336) [phone]   Clinic Follow up Note   Patient Care Team: [facility] [place] as PCP - General Jennifer Keens, MD as Consulting Physician (General Surgery) Jennifer Merle, MD as Consulting Physician (Hematology) Jennifer Kaufmann, RN as Oncology Nurse Navigator Jennifer Germany, RN as Oncology Nurse Navigator Jennifer Feeling, NP as Nurse Practitioner (Nurse Practitioner) Jennifer Rudd, MD as Consulting Physician (Radiation Oncology) 05/10/2021  CHIEF COMPLAINT: Follow up left breast cancer   SUMMARY OF ONCOLOGIC HISTORY: Oncology History Overview Note  Cancer Staging Cancer of central portion of left female breast [facility]) Staging form: Breast, AJCC 8th Edition - Clinical stage from 04/28/2018: Stage IB (cT1c, cN0, cM0, G3, ER-, PR-, HER2-) - Signed by Jennifer Merle, MD on 05/24/2018 Histologic grading system: 3 grade system - Pathologic stage from 06/02/2018: Stage IIA (pT2, pN0, cM0, G3, ER-, PR-, HER2-) - Signed by Jennifer Merle, MD on 06/23/2018 Neoadjuvant therapy: No Nuclear grade: G3 Multigene prognostic tests performed: None Histologic grading system: 3 grade system Residual tumor (R): R0 - None Laterality: Left    Cancer of central portion of left female breast ([place])  04/28/2018 Cancer Staging   Staging form: Breast, AJCC 8th Edition - Clinical stage from 04/28/2018: Stage IB (cT1c, cN0, cM0, G3, ER-, PR-, HER2-) - Signed by Jennifer Merle, MD on 05/24/2018   05/24/2018 Initial Diagnosis   Cancer of central portion of left female breast ([place])   06/02/2018 Cancer Staging   Staging form: Breast, AJCC 8th Edition - Pathologic stage from 06/02/2018: Stage IIA (pT2, pN0, cM0, G3, ER-, PR-, HER2-) - Signed by Jennifer Merle, MD on 06/23/2018   06/07/2018 Surgery   LEFT BREAST PARTIAL MASTECTOMY WITH AXILLARY SENTINEL LYMPH NODE BIOPSY and [place] placement by Jennifer Johns  06/07/18   06/07/2018 Pathology Results   Diagnosis 06/07/18 1.  Breast, partial mastectomy, Left - INVASIVE DUCTAL CARCINOMA, GRADE III, 2.1 CM 1 of 4 FINAL for Jennifer, Johns J [phone]) Diagnosis(continued) - SURGICAL RESECTION MARGINS ARE NEGATIVE FOR CARCINOMA. - NEGATIVE FOR LYMPHOVASCULAR OR PERINEURAL INVASION. - BIOPSY SITE CHANGES. - SEE ONCOLOGY TABLE. - SEE NOTE. 2. Lymph node, sentinel, biopsy, Left Axillary - LYMPH NODE, NEGATIVE FOR CARCINOMA (0/1). 3. Lymph node, sentinel, biopsy, Left - LYMPH NODE, NEGATIVE FOR CARCINOMA (0/1). 4. Lymph node, sentinel, biopsy, Left - LYMPH NODE, NEGATIVE FOR CARCINOMA (0/1). 5. Lymph node, sentinel, biopsy, Left - LYMPH NODE, NEGATIVE FOR CARCINOMA (0/1). 6. Lymph node, sentinel, biopsy, Left - LYMPH NODE, NEGATIVE FOR CARCINOMA (0/1). 7. Lymph node, sentinel, biopsy, Left - LYMPH NODE, NEGATIVE FOR CARCINOMA (0/1). The tumor cells are Negative for Her2 (0). Estrogen Receptor: 0%, NEGATIVE Progesterone Receptor: 0%, NEGATIVE Proliferation Marker Ki67: 40%   06/08/2018 Breast MRI   MRI Breast B/l 05/29/18 IMPRESSION: Solitary mass within the UPPER central portion of the LEFT breast, 2.3 Centimeters in maximum diameter. No MRI evidence for adenopathy. RIGHT breast is negative. LEFT BREAST PARTIAL MASTECTOMY WITH AXILLARY SENTINEL LYMPH NODE BIOPSY with PAC placement by Jennifer Johns 06/07/18   07/11/2018 Imaging   Whole Body Bone Scan 07/11/18  IMPRESSION: No scintigraphic evidence of osseous metastatic disease.   07/13/2018 -  Chemotherapy   Adjuvant AC every 2 weeks for 4 cycles, 07/13/18-08/24/18. Followed by Taxol every 2 weeks starting 09/11/18-11/30/18   08/20/2018 Imaging   CT AP W Contrast 08/20/18  IMPRESSION: No acute findings in the abdomen/pelvis. Mild prominence of the main pancreatic duct measuring 6 mm. No evidence  of mass, adenopathy or ductal stones. Subtle focal nodularity over the posterior left lower lobe which may be acute or chronic and may be due to an infectious  versus atypical infectious or inflammatory process. Recommend follow-up chest CT 4-6 weeks. Two subcentimeter liver hypodensities too small to characterize but likely cysts. Aortic Atherosclerosis (ICD10-I70.0).   01/15/2019 - 02/09/2019 Radiation Therapy   01/15/2019-02/09/2019:  The left breast was treated to 50.56 Gy in 16 fractions followed by a 8 Gy boost in 4 fractions.20 Per Dr. Lisbeth Johns   03/29/2019 Survivorship   Per Jennifer Rue, NP    07/19/2019 Procedure   She previously had PAC removed on 07/19/19.      CURRENT THERAPY: Surveillance   INTERVAL HISTORY: [name] returns for follow up as scheduled. She was last seen 11/10/20 by Dr. Burr Johns. Mammogram 08/26/20 was negative. DEXA 04/17/21 showed osteopenia, lowest T score -2.2 at R femur neck.    REVIEW OF SYSTEMS:   Constitutional: Denies fevers, chills or abnormal weight loss Eyes: Denies blurriness of vision Ears, nose, mouth, throat, and face: Denies mucositis or sore throat Respiratory: Denies cough, dyspnea or wheezes Cardiovascular: Denies palpitation, chest discomfort or lower extremity swelling Gastrointestinal:  Denies nausea, heartburn or change in bowel habits Skin: Denies abnormal skin rashes Lymphatics: Denies new lymphadenopathy or easy bruising Neurological:Denies numbness, tingling or new weaknesses Behavioral/Psych: Mood is stable, no new changes  All other systems were reviewed with the patient and are negative.  MEDICAL HISTORY:  Past Medical History:  Diagnosis Date   Asthma    Breast cancer ([place])    left breast   Cancer ([place])    Left Breast   COPD (chronic obstructive pulmonary disease) ([place])    Hypertension    Personal history of chemotherapy    2019   Personal history of radiation therapy    2019    SURGICAL HISTORY: Past Surgical History:  Procedure Laterality Date   BREAST BIOPSY Left    BREAST LUMPECTOMY Left 06/07/2018   Malignant   COLONOSCOPY N/A 03/18/2016   Procedure:  COLONOSCOPY;  Surgeon: Jennifer Binder, MD;  [facility];  Service: Endoscopy;  Laterality: N/A;  10:30 AM   COLONOSCOPY N/A 07/02/2019   Procedure: COLONOSCOPY;  Surgeon: Jennifer Binder, MD;  [facility];  Service: Endoscopy;  Laterality: N/A;  2:00   PARTIAL MASTECTOMY WITH AXILLARY SENTINEL LYMPH NODE BIOPSY Left 06/07/2018   Procedure: LEFT BREAST PARTIAL MASTECTOMY WITH AXILLARY SENTINEL LYMPH NODE BIOPSY;  Surgeon: Jennifer Keens, MD;  Location: [place];  Service: General;  Laterality: Left;   POLYPECTOMY  07/02/2019   Procedure: POLYPECTOMY;  Surgeon: Jennifer Binder, MD;  [facility];  Service: Endoscopy;;   PORT-A-CATH REMOVAL N/A 07/19/2019   Procedure: REMOVAL PORT-A-CATH;  Surgeon: Jennifer Keens, MD;  Location: [place];  Service: General;  Laterality: N/A;   PORTACATH PLACEMENT N/A 06/07/2018   Procedure: INSERTION PORT-A-CATH;  Surgeon: Jennifer Keens, MD;  Location: [place];  Service: General;  Laterality: N/A;    I have reviewed the social history and family history with the patient and they are unchanged from previous note.  ALLERGIES:  is allergic to carrot [daucus carota], other, and zantac [ranitidine hcl].  MEDICATIONS:  Current Outpatient Medications  Medication Sig Dispense Refill   albuterol (ACCUNEB) 1.25 MG/3ML nebulizer solution Take 1 ampule by nebulization every 6 (six) hours as needed for wheezing or shortness of breath.      albuterol (PROVENTIL HFA;VENTOLIN  HFA) 108 (90 Base) MCG/ACT inhaler Inhale 2 puffs into the lungs every 6 (six) hours as needed for wheezing or shortness of breath.      budesonide-formoterol (SYMBICORT) 160-4.5 MCG/ACT inhaler Inhale 2 puffs into the lungs 2 (two) times daily.     cetirizine (ZYRTEC) 10 MG tablet      cholecalciferol (VITAMIN D) 1000 units tablet Take 1,000 Units by mouth daily.   1   gabapentin (NEURONTIN) 300 MG capsule Take 1 capsule (300 mg total) by mouth at  bedtime. 90 capsule 3   hydrochlorothiazide (MICROZIDE) 12.5 MG capsule Take 12.5 mg by mouth daily.     Ipratropium-Albuterol (ALBUTEROL-IPRATROPIUM IN)      lisinopril (PRINIVIL,ZESTRIL) 10 MG tablet Take 10 mg by mouth daily.     metFORMIN (GLUCOPHAGE) 500 MG tablet      omeprazole (PRILOSEC) 40 MG capsule Take 40 mg by mouth daily.     predniSONE (STERAPRED UNI-PAK 21 TAB) 10 MG (21) TBPK tablet Take by mouth.     traMADol (ULTRAM) 50 MG tablet Take 1 tablet (50 mg total) by mouth every 6 (six) hours as needed for moderate pain. 20 tablet 0   No current facility-administered medications for this visit.    PHYSICAL EXAMINATION: ECOG PERFORMANCE STATUS: {CHL ONC ECOG PS:[id number]}  There were no vitals filed for this visit. There were no vitals filed for this visit.  GENERAL:alert, no distress and comfortable SKIN: skin color, texture, turgor are normal, no rashes or significant lesions EYES: normal, Conjunctiva are pink and non-injected, sclera clear OROPHARYNX:no exudate, no erythema and lips, buccal mucosa, and tongue normal  NECK: supple, thyroid normal size, non-tender, without nodularity LYMPH:  no palpable lymphadenopathy in the cervical, axillary or inguinal LUNGS: clear to auscultation and percussion with normal breathing effort HEART: regular rate & rhythm and no murmurs and no lower extremity edema ABDOMEN:abdomen soft, non-tender and normal bowel sounds Musculoskeletal:no cyanosis of digits and no clubbing  NEURO: alert & oriented x 3 with fluent speech, no focal motor/sensory deficits  LABORATORY DATA:  I have reviewed the data as listed CBC Latest Ref Rng & Units 11/10/2020 03/21/2020 11/15/2019  WBC 4.0 - 10.5 K/uL 6.0 7.2 6.4  Hemoglobin 12.0 - 15.0 g/dL 11.9(L) 12.7 12.1  Hematocrit 36.0 - 46.0 % 38.0 40.7 38.3  Platelets 150 - 400 K/uL 199 182 197     CMP Latest Ref Rng & Units 11/10/2020 03/21/2020 11/15/2019  Glucose 70 - 99 mg/dL 111(H) 154(H) 140(H)  BUN 8 -  23 mg/dL 18 18 16   Creatinine 0.44 - 1.00 mg/dL 1.12(H) 1.23(H) 1.14(H)  Sodium 135 - 145 mmol/L 142 141 142  Potassium 3.5 - 5.1 mmol/L 4.1 3.8 3.9  Chloride 98 - 111 mmol/L 106 106 106  CO2 22 - 32 mmol/L 27 25 26   Calcium 8.9 - 10.3 mg/dL 10.2 10.8(H) 10.0  Total Protein 6.5 - 8.1 g/dL 6.9 7.2 7.2  Total Bilirubin 0.3 - 1.2 mg/dL 0.2(L) 0.3 0.3  Alkaline Phos 38 - 126 U/L 91 91 101  AST 15 - 41 U/L 13(L) 17 15  ALT 0 - 44 U/L 12 17 16       RADIOGRAPHIC STUDIES: I have personally reviewed the radiological images as listed and agreed with the findings in the report. No results found.   ASSESSMENT & PLAN:  No problem-specific Assessment & Plan notes found for this encounter.   No orders of the defined types were placed in this encounter.  All questions were answered. The  patient knows to call the clinic with any problems, questions or concerns. No barriers to learning was detected. I spent {CHL ONC TIME VISIT - XVQMG:[id number]} counseling the patient face to face. The total time spent in the appointment was {CHL ONC TIME VISIT - OIZTI:[id number]} and more than 50% was on counseling and review of test results     Jennifer Feeling, NP 05/10/21

## 2021-05-11 ENCOUNTER — Telehealth: Payer: Self-pay | Admitting: Nurse Practitioner

## 2021-05-11 ENCOUNTER — Inpatient Hospital Stay: Payer: Medicaid Other | Attending: Nurse Practitioner | Admitting: Nurse Practitioner

## 2021-05-11 ENCOUNTER — Telehealth: Payer: Self-pay

## 2021-05-11 ENCOUNTER — Inpatient Hospital Stay: Payer: Medicaid Other

## 2021-05-11 NOTE — Telephone Encounter (Signed)
R/s appts per 8/22 sch msg. Called pt's daughter, no answer. Left msg with updated appts date and times.

## 2021-05-11 NOTE — Telephone Encounter (Signed)
Called pt's daughter, Jennifer Johns as pt is [place]/NS to appt today. Pt's daughter states she was unaware of appt. Message sent to scheduling to call daughter and r/s. Daughter aware and verbalized thanks.

## 2021-05-31 NOTE — Progress Notes (Signed)
Jennifer Johns   Telephone:([phone] Fax:(336) [phone]   Clinic Follow up Note   Patient Care Team: Coolidge Breeze, FNP as PCP - General (Family Medicine) Coralie Keens, MD as Consulting Physician (General Surgery) Truitt Merle, MD as Consulting Physician (Hematology) Mauro Kaufmann, RN as Oncology Nurse Navigator Rockwell Germany, RN as Oncology Nurse Navigator Alla Feeling, NP as Nurse Practitioner (Nurse Practitioner) Kyung Rudd, MD as Consulting Physician (Radiation Oncology) 06/01/2021  CHIEF COMPLAINT: Follow up left breast cancer   SUMMARY OF ONCOLOGIC HISTORY: Oncology History Overview Note  Cancer Staging Cancer of central portion of left female breast [facility]) Staging form: Breast, AJCC 8th Edition - Clinical stage from 04/28/2018: Stage IB (cT1c, cN0, cM0, G3, ER-, PR-, HER2-) - Signed by Truitt Merle, MD on 05/24/2018 Histologic grading system: 3 grade system - Pathologic stage from 06/02/2018: Stage IIA (pT2, pN0, cM0, G3, ER-, PR-, HER2-) - Signed by Truitt Merle, MD on 06/23/2018 Neoadjuvant therapy: No Nuclear grade: G3 Multigene prognostic tests performed: None Histologic grading system: 3 grade system Residual tumor (R): R0 - None Laterality: Left    Cancer of central portion of left female breast ([place])  04/28/2018 Cancer Staging   Staging form: Breast, AJCC 8th Edition - Clinical stage from 04/28/2018: Stage IB (cT1c, cN0, cM0, G3, ER-, PR-, HER2-) - Signed by Truitt Merle, MD on 05/24/2018   05/24/2018 Initial Diagnosis   Cancer of central portion of left female breast ([place])   06/02/2018 Cancer Staging   Staging form: Breast, AJCC 8th Edition - Pathologic stage from 06/02/2018: Stage IIA (pT2, pN0, cM0, G3, ER-, PR-, HER2-) - Signed by Truitt Merle, MD on 06/23/2018   06/07/2018 Surgery   LEFT BREAST PARTIAL MASTECTOMY WITH AXILLARY SENTINEL LYMPH NODE BIOPSY and [place] placement by Dr. Ninfa Linden  06/07/18   06/07/2018 Pathology Results   Diagnosis 06/07/18 1.  Breast, partial mastectomy, Left - INVASIVE DUCTAL CARCINOMA, GRADE III, 2.1 CM 1 of 4 FINAL for Jennifer Johns, Jennifer Johns [phone]) Diagnosis(continued) - SURGICAL RESECTION MARGINS ARE NEGATIVE FOR CARCINOMA. - NEGATIVE FOR LYMPHOVASCULAR OR PERINEURAL INVASION. - BIOPSY SITE CHANGES. - SEE ONCOLOGY TABLE. - SEE NOTE. 2. Lymph node, sentinel, biopsy, Left Axillary - LYMPH NODE, NEGATIVE FOR CARCINOMA (0/1). 3. Lymph node, sentinel, biopsy, Left - LYMPH NODE, NEGATIVE FOR CARCINOMA (0/1). 4. Lymph node, sentinel, biopsy, Left - LYMPH NODE, NEGATIVE FOR CARCINOMA (0/1). 5. Lymph node, sentinel, biopsy, Left - LYMPH NODE, NEGATIVE FOR CARCINOMA (0/1). 6. Lymph node, sentinel, biopsy, Left - LYMPH NODE, NEGATIVE FOR CARCINOMA (0/1). 7. Lymph node, sentinel, biopsy, Left - LYMPH NODE, NEGATIVE FOR CARCINOMA (0/1). The tumor cells are Negative for Her2 (0). Estrogen Receptor: 0%, NEGATIVE Progesterone Receptor: 0%, NEGATIVE Proliferation Marker Ki67: 40%   06/08/2018 Breast MRI   MRI Breast B/l 05/29/18 IMPRESSION: Solitary mass within the UPPER central portion of the LEFT breast, 2.3 Centimeters in maximum diameter. No MRI evidence for adenopathy. RIGHT breast is negative. LEFT BREAST PARTIAL MASTECTOMY WITH AXILLARY SENTINEL LYMPH NODE BIOPSY with PAC placement by Dr. Ninfa Linden 06/07/18   07/11/2018 Imaging   Whole Body Bone Scan 07/11/18  IMPRESSION: No scintigraphic evidence of osseous metastatic disease.   07/13/2018 -  Chemotherapy   Adjuvant AC every 2 weeks for 4 cycles, 07/13/18-08/24/18. Followed by Taxol every 2 weeks starting 09/11/18-11/30/18   08/20/2018 Imaging   CT AP W Contrast 08/20/18  IMPRESSION: No acute findings in the abdomen/pelvis. Mild prominence of the main pancreatic duct measuring 6 mm. No evidence  of mass, adenopathy or ductal stones. Subtle focal nodularity over the posterior left lower lobe which may be acute or chronic and may be due to an infectious  versus atypical infectious or inflammatory process. Recommend follow-up chest CT 4-6 weeks. Two subcentimeter liver hypodensities too small to characterize but likely cysts. Aortic Atherosclerosis (ICD10-I70.0).   01/15/2019 - 02/09/2019 Radiation Therapy   01/15/2019-02/09/2019:  The left breast was treated to 50.56 Gy in 16 fractions followed by a 8 Gy boost in 4 fractions.20 Per Dr. Lisbeth Renshaw   03/29/2019 Survivorship   Per Cira Rue, NP    07/19/2019 Procedure   She previously had PAC removed on 07/19/19.      CURRENT THERAPY: Surveillance   INTERVAL HISTORY: Jennifer Johns returns for follow up as scheduled. She was last seen by Dr. Burr Medico 11/10/20. DEXA 04/17/21 showed osteopenia lowest T score -2.2 at right femur neck. FRAX 1% for hip and 5.4% for major fracture. She takes vitamin D 1000 IU daily, not on calcium due to intermittent elevation. She also had a mammogram in [place] last month.  She reports mild intermittent neuropathy to fingertips and toes, she is functional without difficulty.  She needs dental work but has nothing planned, last dentist visit 5 months ago.  She has a stable dry cough she attributes to "bronchitis and asthma I got when I got older."  She did not do breathing treatment this morning.  Denies any other new concerns or specific complaints including new breast mass, nipple discharge or inversion, or skin change, change in bowel habits, fatigue, unintentional weight loss, bone or joint pain.    MEDICAL HISTORY:  Past Medical History:  Diagnosis Date   Asthma    Breast cancer ([place])    left breast   Cancer ([place])    Left Breast   COPD (chronic obstructive pulmonary disease) ([place])    Hypertension    Personal history of chemotherapy    2019   Personal history of radiation therapy    2019    SURGICAL HISTORY: Past Surgical History:  Procedure Laterality Date   BREAST BIOPSY Left    BREAST LUMPECTOMY Left 06/07/2018   Malignant   COLONOSCOPY N/A 03/18/2016    Procedure: COLONOSCOPY;  Surgeon: Danie Binder, MD;  [facility];  Service: Endoscopy;  Laterality: N/A;  10:30 AM   COLONOSCOPY N/A 07/02/2019   Procedure: COLONOSCOPY;  Surgeon: Danie Binder, MD;  [facility];  Service: Endoscopy;  Laterality: N/A;  2:00   PARTIAL MASTECTOMY WITH AXILLARY SENTINEL LYMPH NODE BIOPSY Left 06/07/2018   Procedure: LEFT BREAST PARTIAL MASTECTOMY WITH AXILLARY SENTINEL LYMPH NODE BIOPSY;  Surgeon: Coralie Keens, MD;  Location: [place];  Service: General;  Laterality: Left;   POLYPECTOMY  07/02/2019   Procedure: POLYPECTOMY;  Surgeon: Danie Binder, MD;  [facility];  Service: Endoscopy;;   PORT-A-CATH REMOVAL N/A 07/19/2019   Procedure: REMOVAL PORT-A-CATH;  Surgeon: Coralie Keens, MD;  Location: [place];  Service: General;  Laterality: N/A;   PORTACATH PLACEMENT N/A 06/07/2018   Procedure: INSERTION PORT-A-CATH;  Surgeon: Coralie Keens, MD;  Location: [place];  Service: General;  Laterality: N/A;    I have reviewed the social history and family history with the patient and they are unchanged from previous note.  ALLERGIES:  is allergic to carrot [daucus carota], other, and zantac [ranitidine hcl].  MEDICATIONS:  Current Outpatient Medications  Medication Sig Dispense Refill   albuterol (ACCUNEB) 1.25 MG/3ML nebulizer  solution Take 1 ampule by nebulization every 6 (six) hours as needed for wheezing or shortness of breath.      albuterol (PROVENTIL HFA;VENTOLIN HFA) 108 (90 Base) MCG/ACT inhaler Inhale 2 puffs into the lungs every 6 (six) hours as needed for wheezing or shortness of breath.      alendronate (FOSAMAX) 70 MG tablet Take 1 tablet (70 mg total) by mouth once a week. Take with a full glass of water on an empty stomach. 4 tablet 3   budesonide-formoterol (SYMBICORT) 160-4.5 MCG/ACT inhaler Inhale 2 puffs into the lungs 2 (two) times daily.     cetirizine (ZYRTEC) 10 MG tablet       cholecalciferol (VITAMIN D) 1000 units tablet Take 1,000 Units by mouth daily.   1   gabapentin (NEURONTIN) 300 MG capsule Take 1 capsule (300 mg total) by mouth at bedtime. 90 capsule 3   hydrochlorothiazide (MICROZIDE) 12.5 MG capsule Take 12.5 mg by mouth daily.     Ipratropium-Albuterol (ALBUTEROL-IPRATROPIUM IN)      lisinopril (PRINIVIL,ZESTRIL) 10 MG tablet Take 10 mg by mouth daily.     metFORMIN (GLUCOPHAGE) 500 MG tablet      omeprazole (PRILOSEC) 40 MG capsule Take 40 mg by mouth daily.     predniSONE (STERAPRED UNI-PAK 21 TAB) 10 MG (21) TBPK tablet Take by mouth.     traMADol (ULTRAM) 50 MG tablet Take 1 tablet (50 mg total) by mouth every 6 (six) hours as needed for moderate pain. 20 tablet 0   No current facility-administered medications for this visit.    PHYSICAL EXAMINATION: ECOG PERFORMANCE STATUS: 0 - Asymptomatic  Vitals:   06/01/21 0841 06/01/21 0905  BP: (!) 160/88 132/86  Pulse: (!) 111 94  Resp: 18   Temp: 98.2 F (36.8 C)   SpO2: 96%    Filed Weights   06/01/21 0841  Weight: 150 lb (68 kg)    GENERAL:alert, no distress and comfortable SKIN: no rash  EYES: sclera clear OROPHARYNX: Some missing upper and teeth; no broken teeth, gingival erythema, ulceration, or obvious periodontal disease NECK: Without mass LYMPH:  no palpable cervical or supraclavicular lymphadenopathy LUNGS: Diminished throughout but clear, with normal breathing effort HEART: regular rate & rhythm, no lower extremity edema ABDOMEN:abdomen soft, non-tender and normal bowel sounds Musculoskeletal: Nonfocal NEURO: alert & oriented x 3 with fluent speech, no focal motor deficits Breast exam: Breasts are symmetrical without nipple discharge or inversion.  S/p left lumpectomy, incisions completely healed.  Minimal scar tissue.  No palpable mass or nodularity in either breast or axilla that I could appreciate  LABORATORY DATA:  I have reviewed the data as listed CBC Latest Ref Rng &  Units 06/01/2021 11/10/2020 03/21/2020  WBC 4.0 - 10.5 K/uL 6.8 6.0 7.2  Hemoglobin 12.0 - 15.0 g/dL 11.5(L) 11.9(L) 12.7  Hematocrit 36.0 - 46.0 % 37.1 38.0 40.7  Platelets 150 - 400 K/uL 209 199 182     CMP Latest Ref Rng & Units 06/01/2021 11/10/2020 03/21/2020  Glucose 70 - 99 mg/dL 141(H) 111(H) 154(H)  BUN 8 - 23 mg/dL 22 18 18   Creatinine 0.44 - 1.00 mg/dL 1.47(H) 1.12(H) 1.23(H)  Sodium 135 - 145 mmol/L 141 142 141  Potassium 3.5 - 5.1 mmol/L 4.0 4.1 3.8  Chloride 98 - 111 mmol/L 108 106 106  CO2 22 - 32 mmol/L 24 27 25   Calcium 8.9 - 10.3 mg/dL 10.6(H) 10.2 10.8(H)  Total Protein 6.5 - 8.1 g/dL 7.5 6.9 7.2  Total Bilirubin 0.3 -  1.2 mg/dL 0.3 0.2(L) 0.3  Alkaline Phos 38 - 126 U/L 86 91 91  AST 15 - 41 U/L 17 13(L) 17  ALT 0 - 44 U/L 17 12 17       RADIOGRAPHIC STUDIES: I have personally reviewed the radiological images as listed and agreed with the findings in the report. No results found.   ASSESSMENT & PLAN:  Jennifer Johns is a 65 y.o. female with    1. 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 S/p lumpectomy, adjuvant chemo with AC-T and Adjuvant radiation.  -Due to triple negative breast cancer, she would not benefit from antiestrogens -mammogram 08/26/20 negative  -continue surveillance   2.  Osteopenia right femur neck -Baseline DEXA 04/17/2021 showed osteopenia lowest T score -2.2 -FRAX 5.4% risk of major osteoporotic fracture and 1% risk of hip fracture -She takes vitamin D, does not take calcium due to intermittent hypercalcemia -Encouraged to increase weightbearing exercise -We discussed management, she prefers to avoid injection and is not a suitable candidate for Zometa due to CKD (Scr 1.47 today, CrCl 40) -Rx for Fosamax starting 05/2021 -will ask PCP to monitor renal function and calcium with frequent CMP  3. Anemia, secondary to chemo   -she was not anemic before  chemo -mild and intermittent   -Hgb 11.5 today, stable   4. Peripheral neuropathy, secondary to chemo, G1 -feet > fingers, no functional limitations or altered balance -previously improved on gabapentin but she ran out -chronic, stable    5. COPD, HTN, health maintenance. ?asthma and bronchitis  -f/u with PCP and continue medications -she has quit smoking completely in 2019 -s/p colonoscopy in 06/2019 showed sessile serrated polyp, 5 year recall per GI  -She has a cough she attributes to chronic bronchitis and asthma. Managed with resp meds/breathing treatments    6. Hyperglycemia  -initially felt to be related to steroids for chemo pre-meds. Improved after chemo -BG 110-150's in the last year -continue PCP f/up   Disposition: Jennifer Johns is clinically doing well.  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  I discussed at this point it is likely a chronic and irreversible side effect from chemo.  Breast exam is benign.  Labs reviewed, Hgb 11.5, BG 141, SCR 1.47, CA 10.6 overall stable with mild intermittent anemia and hypercalcemia.  Continue holding calcium supplement but continue vitamin D.  We discussed her renal dysfunction, I encouraged her to hydrate and control HTN and DM with close PCP follow-up.  I will request her most recent mammogram.  Overall there is no clinical concern for breast cancer recurrence.    I reviewed her baseline DEXA from 04/17/2021 which shows osteopenia at the right femur neck T score -2.2 with a FRAX score of 5% risk of major osteoporotic fracture and 1% risk of hip fracture.  We reviewed management.  Due to her intermittent hypercalcemia she is not on calcium supplement but is compliant with vitamin D.  I encouraged her to increase weightbearing exercise.  We discussed medical management including Zometa, Prolia/Xgeva, and oral bisphosphonate such as Fosamax.  Given her CKD she is not a good candidate for Zometa.  She prefers a pill.   I reviewed the potential benefit, side effects, and administration instructions for Fosamax.  CrCl of 40, will start with full dose.  She agrees to proceed.  I will ask PCP to monitor renal function and calcium monthly.  She is 3 years from initial diagnosis, continue surveillance.  I encouraged her to maintain healthy active lifestyle, good nutrition and hydration, appropriate vaccines, and staying up-to-date on age-appropriate cancer screenings.    Return to [facility] four surveillance f/up in 6 months, or sooner if needed.  We reviewed signs and symptoms of recurrence.  All questions were answered. The patient knows to call the clinic with any problems, questions or concerns. No barriers to learning were detected. Total encounter time was 40 minutes.      Alla Feeling, NP 06/01/21

## 2021-06-01 ENCOUNTER — Encounter: Payer: Self-pay | Admitting: Nurse Practitioner

## 2021-06-01 ENCOUNTER — Other Ambulatory Visit: Payer: Self-pay

## 2021-06-01 ENCOUNTER — Telehealth: Payer: Self-pay

## 2021-06-01 ENCOUNTER — Inpatient Hospital Stay (HOSPITAL_BASED_OUTPATIENT_CLINIC_OR_DEPARTMENT_OTHER): Payer: Medicare Other | Admitting: Nurse Practitioner

## 2021-06-01 ENCOUNTER — Inpatient Hospital Stay: Payer: Medicare Other | Attending: Nurse Practitioner

## 2021-06-01 VITALS — BP 132/86 | HR 94 | Temp 98.2°F | Resp 18 | Wt 150.0 lb

## 2021-06-01 DIAGNOSIS — C50112 Malignant neoplasm of central portion of left female breast: Secondary | ICD-10-CM

## 2021-06-01 DIAGNOSIS — Z9012 Acquired absence of left breast and nipple: Secondary | ICD-10-CM | POA: Insufficient documentation

## 2021-06-01 DIAGNOSIS — D6481 Anemia due to antineoplastic chemotherapy: Secondary | ICD-10-CM | POA: Diagnosis not present

## 2021-06-01 DIAGNOSIS — J449 Chronic obstructive pulmonary disease, unspecified: Secondary | ICD-10-CM | POA: Diagnosis not present

## 2021-06-01 DIAGNOSIS — Z923 Personal history of irradiation: Secondary | ICD-10-CM | POA: Diagnosis not present

## 2021-06-01 DIAGNOSIS — Z9221 Personal history of antineoplastic chemotherapy: Secondary | ICD-10-CM | POA: Insufficient documentation

## 2021-06-01 DIAGNOSIS — M85851 Other specified disorders of bone density and structure, right thigh: Secondary | ICD-10-CM | POA: Diagnosis not present

## 2021-06-01 DIAGNOSIS — G620 Drug-induced polyneuropathy: Secondary | ICD-10-CM | POA: Insufficient documentation

## 2021-06-01 DIAGNOSIS — I10 Essential (primary) hypertension: Secondary | ICD-10-CM | POA: Diagnosis not present

## 2021-06-01 DIAGNOSIS — R739 Hyperglycemia, unspecified: Secondary | ICD-10-CM | POA: Diagnosis not present

## 2021-06-01 DIAGNOSIS — Z171 Estrogen receptor negative status [ER-]: Secondary | ICD-10-CM | POA: Diagnosis not present

## 2021-06-01 LAB — CBC WITH DIFFERENTIAL (CANCER CENTER ONLY)
Abs Immature Granulocytes: 0.02 10*3/uL (ref 0.00–0.07)
Basophils Absolute: 0 10*3/uL (ref 0.0–0.1)
Basophils Relative: 0 %
Eosinophils Absolute: 0.1 10*3/uL (ref 0.0–0.5)
Eosinophils Relative: 2 %
HCT: 37.1 % (ref 36.0–46.0)
Hemoglobin: 11.5 g/dL — ABNORMAL LOW (ref 12.0–15.0)
Immature Granulocytes: 0 %
Lymphocytes Relative: 27 %
Lymphs Abs: 1.8 10*3/uL (ref 0.7–4.0)
MCH: 28.8 pg (ref 26.0–34.0)
MCHC: 31 g/dL (ref 30.0–36.0)
MCV: 92.8 fL (ref 80.0–100.0)
Monocytes Absolute: 0.7 10*3/uL (ref 0.1–1.0)
Monocytes Relative: 10 %
Neutro Abs: 4.2 10*3/uL (ref 1.7–7.7)
Neutrophils Relative %: 61 %
Platelet Count: 209 10*3/uL (ref 150–400)
RBC: 4 MIL/uL (ref 3.87–5.11)
RDW: 13.2 % (ref 11.5–15.5)
WBC Count: 6.8 10*3/uL (ref 4.0–10.5)
nRBC: 0 % (ref 0.0–0.2)

## 2021-06-01 LAB — CMP (CANCER CENTER ONLY)
ALT: 17 U/L (ref 0–44)
AST: 17 U/L (ref 15–41)
Albumin: 3.6 g/dL (ref 3.5–5.0)
Alkaline Phosphatase: 86 U/L (ref 38–126)
Anion gap: 9 (ref 5–15)
BUN: 22 mg/dL (ref 8–23)
CO2: 24 mmol/L (ref 22–32)
Calcium: 10.6 mg/dL — ABNORMAL HIGH (ref 8.9–10.3)
Chloride: 108 mmol/L (ref 98–111)
Creatinine: 1.47 mg/dL — ABNORMAL HIGH (ref 0.44–1.00)
GFR, Estimated: 39 mL/min — ABNORMAL LOW (ref >60)
Glucose, Bld: 141 mg/dL — ABNORMAL HIGH (ref 70–99)
Potassium: 4 mmol/L (ref 3.5–5.1)
Sodium: 141 mmol/L (ref 135–145)
Total Bilirubin: 0.3 mg/dL (ref 0.3–1.2)
Total Protein: 7.5 g/dL (ref 6.5–8.1)

## 2021-06-01 MED ORDER — ALENDRONATE SODIUM 70 MG PO TABS: 70.0000 mg | ORAL_TABLET | ORAL | 3 refills | Status: DC

## 2021-06-01 NOTE — Telephone Encounter (Signed)
Per Cira Rue NP, faxed most recent office note to patient's PCP requesting monthly CMP monitoring w/ start of Fosamax.   Also, called [facility] in [place] to request patient's most recent mammogram. Left voicemail with callback information.

## 2021-11-25 ENCOUNTER — Encounter: Payer: Self-pay | Admitting: Hematology

## 2021-12-01 NOTE — Progress Notes (Deleted)
?[place]   ?Telephone:(336) [phone] [phone]   ?Clinic Follow up Note  ? ?Patient Care Team: ?Coolidge Breeze, FNP as PCP - General (Family Medicine) ?Coralie Keens, MD as Consulting Physician (General Surgery) ?Truitt Merle, MD as Consulting Physician (Hematology) ?Mauro Kaufmann, RN as Oncology Nurse Navigator ?Rockwell Germany, RN as Oncology Nurse Navigator ?Alla Feeling, NP as Nurse Practitioner (Nurse Practitioner) ?Kyung Rudd, MD as Consulting Physician (Radiation Oncology) ?12/01/2021 ? ?CHIEF COMPLAINT: Follow-up triple negative left breast cancer ? ?SUMMARY OF ONCOLOGIC HISTORY: ?Oncology History Overview Note  ?Cancer Staging ?Cancer of central portion of left female breast ([place]) ?Staging form: Breast, AJCC 8th Edition ?- Clinical stage from 04/28/2018: Stage IB (cT1c, cN0, cM0, G3, ER-, PR-, HER2-) - Signed by Truitt Merle, MD on 05/24/2018 ?Histologic grading system: 3 grade system ?- Pathologic stage from 06/02/2018: Stage IIA (pT2, pN0, cM0, G3, ER-, PR-, HER2-) - Signed by Truitt Merle, MD on 06/23/2018 ?Neoadjuvant therapy: No ?Nuclear grade: G3 ?Multigene prognostic tests performed: None ?Histologic grading system: 3 grade system ?Residual tumor (R): R0 - None ?Laterality: Left ? ?  ?Cancer of central portion of left female breast ([place])  ?04/28/2018 Cancer Staging  ? Staging form: Breast, AJCC 8th Edition ?- Clinical stage from 04/28/2018: Stage IB (cT1c, cN0, cM0, G3, ER-, PR-, HER2-) - Signed by Truitt Merle, MD on 05/24/2018 ? ?  ?05/24/2018 Initial Diagnosis  ? Cancer of central portion of left female breast [facility]) ?  ?06/02/2018 Cancer Staging  ? Staging form: Breast, AJCC 8th Edition ?- Pathologic stage from 06/02/2018: Stage IIA (pT2, pN0, cM0, G3, ER-, PR-, HER2-) - Signed by Truitt Merle, MD on 06/23/2018 ? ?  ?06/07/2018 Surgery  ? LEFT BREAST PARTIAL MASTECTOMY WITH AXILLARY SENTINEL LYMPH NODE BIOPSY and PAC placement by Dr. Ninfa Linden  ?06/07/18 ?  ?06/07/2018 Pathology Results  ?  Diagnosis 06/07/18 ?1. Breast, partial mastectomy, Left ?- INVASIVE DUCTAL CARCINOMA, GRADE III, 2.1 CM ?1 of 4 ?FINAL for SHAROLYN, WEBER ([id number]) ?Diagnosis(continued) ?- SURGICAL RESECTION MARGINS ARE NEGATIVE FOR CARCINOMA. ?- NEGATIVE FOR LYMPHOVASCULAR OR PERINEURAL INVASION. ?- BIOPSY SITE CHANGES. ?- SEE ONCOLOGY TABLE. ?- SEE NOTE. ?2. Lymph node, sentinel, biopsy, Left Axillary ?- LYMPH NODE, NEGATIVE FOR CARCINOMA (0/1). ?3. Lymph node, sentinel, biopsy, Left ?- LYMPH NODE, NEGATIVE FOR CARCINOMA (0/1). ?4. Lymph node, sentinel, biopsy, Left ?- LYMPH NODE, NEGATIVE FOR CARCINOMA (0/1). ?5. Lymph node, sentinel, biopsy, Left ?- LYMPH NODE, NEGATIVE FOR CARCINOMA (0/1). ?6. Lymph node, sentinel, biopsy, Left ?- LYMPH NODE, NEGATIVE FOR CARCINOMA (0/1). ?7. Lymph node, sentinel, biopsy, Left ?- LYMPH NODE, NEGATIVE FOR CARCINOMA (0/1). ?The tumor cells are Negative for Her2 (0). ?Estrogen Receptor: 0%, NEGATIVE ?Progesterone Receptor: 0%, NEGATIVE ?Proliferation Marker Ki67: 40% ?  ?06/08/2018 Breast MRI  ? MRI Breast B/l 05/29/18 ?IMPRESSION: ?Solitary mass within the UPPER central portion of the LEFT breast, ?2.3 Centimeters in maximum diameter. ?No MRI evidence for adenopathy. ?RIGHT breast is negative. ?LEFT BREAST PARTIAL MASTECTOMY WITH AXILLARY SENTINEL LYMPH NODE BIOPSY with PAC placement by Dr. Ninfa Linden ?06/07/18 ?  ?07/11/2018 Imaging  ? Whole Body Bone Scan 07/11/18  ?IMPRESSION: ?No scintigraphic evidence of osseous metastatic disease. ?  ?07/13/2018 -  Chemotherapy  ? Adjuvant AC every 2 weeks for 4 cycles, 07/13/18-08/24/18. Followed by Taxol every 2 weeks starting 09/11/18-11/30/18 ?  ?08/20/2018 Imaging  ? CT AP W Contrast 08/20/18  ?IMPRESSION: ?No acute findings in the abdomen/pelvis. ?Mild prominence of the main pancreatic duct measuring 6 mm.  No ?evidence of mass, adenopathy or ductal stones. ?Subtle focal nodularity over the posterior left lower lobe which may ?be acute or chronic and may be  due to an infectious versus atypical ?infectious or inflammatory process. Recommend follow-up chest CT 4-6 ?weeks. ?Two subcentimeter liver hypodensities too small to characterize but ?likely cysts. ?Aortic Atherosclerosis (ICD10-I70.0). ?  ?01/15/2019 - 02/09/2019 Radiation Therapy  ? 01/15/2019-02/09/2019: ? The left breast was treated to 50.56 Gy in 16 fractions followed by a 8 Gy boost in 4 fractions.20 ?Per Dr. Lisbeth Renshaw ?  ?03/29/2019 Survivorship  ? Per Cira Rue, NP  ?  ?07/19/2019 Procedure  ? She previously had PAC removed on 07/19/19.  ?  ? ? ?CURRENT THERAPY: Surveillance ? ?INTERVAL HISTORY: Ms. Emigh returns for follow-up as scheduled, last seen by me 06/01/2021. ? ? ?REVIEW OF SYSTEMS:   ?Constitutional: Denies fevers, chills or abnormal weight loss ?Eyes: Denies blurriness of vision ?Ears, nose, mouth, throat, and face: Denies mucositis or sore throat ?Respiratory: Denies cough, dyspnea or wheezes ?Cardiovascular: Denies palpitation, chest discomfort or lower extremity swelling ?Gastrointestinal:  Denies nausea, heartburn or change in bowel habits ?Skin: Denies abnormal skin rashes ?Lymphatics: Denies new lymphadenopathy or easy bruising ?Neurological:Denies numbness, tingling or new weaknesses ?Behavioral/Psych: Mood is stable, no new changes  ?All other systems were reviewed with the patient and are negative. ? ?MEDICAL HISTORY:  ?Past Medical History:  ?Diagnosis Date  ? Asthma   ? Breast cancer ([place])   ? left breast  ? Cancer ([place])   ? Left Breast  ? COPD (chronic obstructive pulmonary disease) ([place])   ? Hypertension   ? Personal history of chemotherapy   ? 2019  ? Personal history of radiation therapy   ? 2019  ? ? ?SURGICAL HISTORY: ?Past Surgical History:  ?Procedure Laterality Date  ? BREAST BIOPSY Left   ? BREAST LUMPECTOMY Left 06/07/2018  ? Malignant  ? COLONOSCOPY N/A 03/18/2016  ? Procedure: COLONOSCOPY;  Surgeon: Danie Binder, MD;  [facility];  Service: Endoscopy;  Laterality:  N/A;  10:30 AM  ? COLONOSCOPY N/A 07/02/2019  ? Procedure: COLONOSCOPY;  Surgeon: Danie Binder, MD;  [facility];  Service: Endoscopy;  Laterality: N/A;  2:00  ? PARTIAL MASTECTOMY WITH AXILLARY SENTINEL LYMPH NODE BIOPSY Left 06/07/2018  ? Procedure: LEFT BREAST PARTIAL MASTECTOMY WITH AXILLARY SENTINEL LYMPH NODE BIOPSY;  Surgeon: Coralie Keens, MD;  Location: [place];  Service: General;  Laterality: Left;  ? POLYPECTOMY  07/02/2019  ? Procedure: POLYPECTOMY;  Surgeon: Danie Binder, MD;  [facility];  Service: Endoscopy;;  ? PORT-A-CATH REMOVAL N/A 07/19/2019  ? Procedure: REMOVAL PORT-A-CATH;  Surgeon: Coralie Keens, MD;  Location: [place];  Service: General;  Laterality: N/A;  ? PORTACATH PLACEMENT N/A 06/07/2018  ? Procedure: INSERTION PORT-A-CATH;  Surgeon: Coralie Keens, MD;  Location: [place];  Service: General;  Laterality: N/A;  ? ? ?I have reviewed the social history and family history with the patient and they are unchanged from previous note. ? ?ALLERGIES:  is allergic to carrot [daucus carota], other, and zantac [ranitidine hcl]. ? ?MEDICATIONS:  ?Current Outpatient Medications  ?Medication Sig Dispense Refill  ? albuterol (ACCUNEB) 1.25 MG/3ML nebulizer solution Take 1 ampule by nebulization every 6 (six) hours as needed for wheezing or shortness of breath.     ? albuterol (PROVENTIL HFA;VENTOLIN HFA) 108 (90 Base) MCG/ACT inhaler Inhale 2 puffs into the lungs every 6 (six) hours as needed for wheezing  or shortness of breath.     ? alendronate (FOSAMAX) 70 MG tablet Take 1 tablet (70 mg total) by mouth once a week. Take with a full glass of water on an empty stomach. 4 tablet 3  ? budesonide-formoterol (SYMBICORT) 160-4.5 MCG/ACT inhaler Inhale 2 puffs into the lungs 2 (two) times daily.    ? cetirizine (ZYRTEC) 10 MG tablet     ? cholecalciferol (VITAMIN D) 1000 units tablet Take 1,000 Units by mouth daily.   1  ? gabapentin (NEURONTIN) 300 MG  capsule Take 1 capsule (300 mg total) by mouth at bedtime. 90 capsule 3  ? hydrochlorothiazide (MICROZIDE) 12.5 MG capsule Take 12.5 mg by mouth daily.    ? Ipratropium-Albuterol (ALBUTEROL-IPRATROPIUM IN)

## 2021-12-03 ENCOUNTER — Inpatient Hospital Stay: Payer: Medicare Other | Attending: Family Medicine

## 2021-12-03 ENCOUNTER — Inpatient Hospital Stay: Payer: Medicare Other | Admitting: Nurse Practitioner

## 2022-01-18 ENCOUNTER — Other Ambulatory Visit: Payer: Self-pay | Admitting: Nurse Practitioner

## 2022-01-18 DIAGNOSIS — M85851 Other specified disorders of bone density and structure, right thigh: Secondary | ICD-10-CM

## 2022-02-01 ENCOUNTER — Other Ambulatory Visit (HOSPITAL_COMMUNITY): Payer: Self-pay | Admitting: Family Medicine

## 2022-02-01 DIAGNOSIS — Z1231 Encounter for screening mammogram for malignant neoplasm of breast: Secondary | ICD-10-CM

## 2022-02-08 ENCOUNTER — Ambulatory Visit (HOSPITAL_COMMUNITY): Payer: Medicare Other

## 2022-02-08 ENCOUNTER — Other Ambulatory Visit (HOSPITAL_COMMUNITY): Payer: Self-pay | Admitting: Family Medicine

## 2022-02-08 DIAGNOSIS — Z9889 Other specified postprocedural states: Secondary | ICD-10-CM

## 2022-02-24 ENCOUNTER — Ambulatory Visit (HOSPITAL_COMMUNITY)
Admission: RE | Admit: 2022-02-24 | Discharge: 2022-02-24 | Disposition: A | Discharge status: Home / Self Care | Payer: Medicare Other | Source: Ambulatory Visit | Attending: Family Medicine | Admitting: Family Medicine

## 2022-02-24 DIAGNOSIS — Z9889 Other specified postprocedural states: Secondary | ICD-10-CM

## 2022-02-24 DIAGNOSIS — Z853 Personal history of malignant neoplasm of breast: Secondary | ICD-10-CM | POA: Diagnosis not present

## 2022-02-24 DIAGNOSIS — Z1231 Encounter for screening mammogram for malignant neoplasm of breast: Secondary | ICD-10-CM | POA: Diagnosis not present

## 2022-02-24 DIAGNOSIS — Z923 Personal history of irradiation: Secondary | ICD-10-CM | POA: Insufficient documentation

## 2022-02-24 DIAGNOSIS — Z9221 Personal history of antineoplastic chemotherapy: Secondary | ICD-10-CM | POA: Diagnosis not present

## 2022-03-10 ENCOUNTER — Other Ambulatory Visit: Payer: Self-pay | Admitting: Nurse Practitioner

## 2022-03-10 DIAGNOSIS — M85851 Other specified disorders of bone density and structure, right thigh: Secondary | ICD-10-CM

## 2022-03-12 ENCOUNTER — Telehealth: Payer: Self-pay | Admitting: Nurse Practitioner

## 2022-03-18 ENCOUNTER — Other Ambulatory Visit: Payer: Self-pay | Admitting: Nurse Practitioner

## 2022-03-18 DIAGNOSIS — M85851 Other specified disorders of bone density and structure, right thigh: Secondary | ICD-10-CM

## 2022-03-24 ENCOUNTER — Other Ambulatory Visit: Payer: Self-pay

## 2022-03-24 DIAGNOSIS — C50112 Malignant neoplasm of central portion of left female breast: Secondary | ICD-10-CM

## 2022-03-28 NOTE — Progress Notes (Addendum)
[place]   [phone] [phone]   Clinic Follow up Note   Patient Care Team: Coolidge Breeze, FNP as PCP - General (Family Medicine) Coralie Keens, MD as Consulting Physician (General Surgery) Truitt Merle, MD as Consulting Physician (Hematology) Mauro Kaufmann, RN as Oncology Nurse Navigator Rockwell Germany, RN as Oncology Nurse Navigator Alla Feeling, NP as Nurse Practitioner (Nurse Practitioner) Kyung Rudd, MD as Consulting Physician (Radiation Oncology) 03/28/2022  CHIEF COMPLAINT: Follow up left breast cancer   SUMMARY OF ONCOLOGIC HISTORY: Oncology History Overview Note  Cancer Staging Cancer of central portion of left female breast [facility]) Staging form: Breast, AJCC 8th Edition - Clinical stage from 04/28/2018: Stage IB (cT1c, cN0, cM0, G3, ER-, PR-, HER2-) - Signed by Truitt Merle, MD on 05/24/2018 Histologic grading system: 3 grade system - Pathologic stage from 06/02/2018: Stage IIA (pT2, pN0, cM0, G3, ER-, PR-, HER2-) - Signed by Truitt Merle, MD on 06/23/2018 Neoadjuvant therapy: No Nuclear grade: G3 Multigene prognostic tests performed: None Histologic grading system: 3 grade system Residual tumor (R): R0 - None Laterality: Left    Cancer of central portion of left female breast ([place])  04/28/2018 Cancer Staging   Staging form: Breast, AJCC 8th Edition - Clinical stage from 04/28/2018: Stage IB (cT1c, cN0, cM0, G3, ER-, PR-, HER2-) - Signed by Truitt Merle, MD on 05/24/2018   05/24/2018 Initial Diagnosis   Cancer of central portion of left female breast ([place])   06/02/2018 Cancer Staging   Staging form: Breast, AJCC 8th Edition - Pathologic stage from 06/02/2018: Stage IIA (pT2, pN0, cM0, G3, ER-, PR-, HER2-) - Signed by Truitt Merle, MD on 06/23/2018   06/07/2018 Surgery   LEFT BREAST PARTIAL MASTECTOMY WITH AXILLARY SENTINEL LYMPH NODE BIOPSY and [place] placement by Dr. Ninfa Linden  06/07/18   06/07/2018 Pathology Results   Diagnosis 06/07/18 1.  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 - NEGATIVE FOR LYMPHOVASCULAR OR PERINEURAL INVASION. - BIOPSY SITE CHANGES. - SEE ONCOLOGY TABLE. - SEE NOTE. 2. Lymph node, sentinel, biopsy, Left Axillary - LYMPH NODE, NEGATIVE FOR CARCINOMA (0/1). 3. Lymph node, sentinel, biopsy, Left - LYMPH NODE, NEGATIVE FOR CARCINOMA (0/1). 4. Lymph node, sentinel, biopsy, Left - LYMPH NODE, NEGATIVE FOR CARCINOMA (0/1). 5. Lymph node, sentinel, biopsy, Left - LYMPH NODE, NEGATIVE FOR CARCINOMA (0/1). 6. Lymph node, sentinel, biopsy, Left - LYMPH NODE, NEGATIVE FOR CARCINOMA (0/1). 7. Lymph node, sentinel, biopsy, Left - LYMPH NODE, NEGATIVE FOR CARCINOMA (0/1). The tumor cells are Negative for Her2 (0). Estrogen Receptor: 0%, NEGATIVE Progesterone Receptor: 0%, NEGATIVE Proliferation Marker Ki67: 40%   06/08/2018 Breast MRI   MRI Breast B/l 05/29/18 IMPRESSION: Solitary mass within the UPPER central portion of the LEFT breast, 2.3 Centimeters in maximum diameter. No MRI evidence for adenopathy. RIGHT breast is negative. LEFT BREAST PARTIAL MASTECTOMY WITH AXILLARY SENTINEL LYMPH NODE BIOPSY with PAC placement by Dr. Ninfa Linden 06/07/18   07/11/2018 Imaging   Whole Body Bone Scan 07/11/18  IMPRESSION: No scintigraphic evidence of osseous metastatic disease.   07/13/2018 -  Chemotherapy   Adjuvant AC every 2 weeks for 4 cycles, 07/13/18-08/24/18. Followed by Taxol every 2 weeks starting 09/11/18-11/30/18   08/20/2018 Imaging   CT AP W Contrast 08/20/18  IMPRESSION: No acute findings in the abdomen/pelvis. Mild prominence of the main pancreatic duct measuring 6 mm. No evidence  of mass, adenopathy or ductal stones. Subtle focal nodularity over the posterior left lower lobe which may be acute or chronic and may be due to an infectious  versus atypical infectious or inflammatory process. Recommend follow-up chest CT 4-6 weeks. Two subcentimeter liver hypodensities too small to characterize but likely cysts. Aortic Atherosclerosis (ICD10-I70.0).   01/15/2019 - 02/09/2019 Radiation Therapy   01/15/2019-02/09/2019:  The left breast was treated to 50.56 Gy in 16 fractions followed by a 8 Gy boost in 4 fractions.20 Per Dr. Lisbeth Renshaw   03/29/2019 Survivorship   Per Cira Rue, NP    07/19/2019 Procedure   She previously had PAC removed on 07/19/19.      CURRENT THERAPY: Surveillance   INTERVAL HISTORY: Ms. Schadler returns for follow up as scheduled. Last seen by me 06/01/21. She began fosamax for osteopenia. She missed an appointment with me in March.  Mammogram 02/24/22 showed postlumpectomy changes and negative for malignancy; density C.    REVIEW OF SYSTEMS:   Constitutional: Denies fevers, chills or abnormal weight loss Eyes: Denies blurriness of vision Ears, nose, mouth, throat, and face: Denies mucositis or sore throat Respiratory: Denies cough, dyspnea or wheezes Cardiovascular: Denies palpitation, chest discomfort or lower extremity swelling Gastrointestinal:  Denies nausea, heartburn or change in bowel habits Skin: Denies abnormal skin rashes Lymphatics: Denies new lymphadenopathy or easy bruising Neurological:Denies numbness, tingling or new weaknesses Behavioral/Psych: Mood is stable, no new changes  All other systems were reviewed with the patient and are negative.  MEDICAL HISTORY:  Past Medical History:  Diagnosis Date   Asthma    Breast cancer ([place])    left breast   Cancer ([place])    Left Breast   COPD (chronic obstructive pulmonary disease) ([place])    Hypertension    Personal history of chemotherapy    2019   Personal history of radiation therapy    2019    SURGICAL HISTORY: Past Surgical History:  Procedure Laterality Date   BREAST BIOPSY Left    BREAST LUMPECTOMY Left 06/07/2018   Malignant    COLONOSCOPY N/A 03/18/2016   Procedure: COLONOSCOPY;  Surgeon: Danie Binder, MD;  [facility];  Service: Endoscopy;  Laterality: N/A;  10:30 AM   COLONOSCOPY N/A 07/02/2019   Procedure: COLONOSCOPY;  Surgeon: Danie Binder, MD;  [facility];  Service: Endoscopy;  Laterality: N/A;  2:00   PARTIAL MASTECTOMY WITH AXILLARY SENTINEL LYMPH NODE BIOPSY Left 06/07/2018   Procedure: LEFT BREAST PARTIAL MASTECTOMY WITH AXILLARY SENTINEL LYMPH NODE BIOPSY;  Surgeon: Coralie Keens, MD;  Location: [place];  Service: General;  Laterality: Left;   POLYPECTOMY  07/02/2019   Procedure: POLYPECTOMY;  Surgeon: Danie Binder, MD;  [facility];  Service: Endoscopy;;   PORT-A-CATH REMOVAL N/A 07/19/2019   Procedure: REMOVAL PORT-A-CATH;  Surgeon: Coralie Keens, MD;  Location: [place];  Service: General;  Laterality: N/A;   PORTACATH PLACEMENT N/A 06/07/2018   Procedure: INSERTION PORT-A-CATH;  Surgeon: Coralie Keens, MD;  Location: [place];  Service: General;  Laterality: N/A;    I have reviewed the social history and family history with the patient and they are unchanged from previous note.  ALLERGIES:  is allergic to carrot [daucus carota], other, and zantac [ranitidine hcl].  MEDICATIONS:  Current Outpatient Medications  Medication Sig Dispense Refill   albuterol (ACCUNEB) 1.25 MG/3ML nebulizer solution Take 1 ampule by nebulization every 6 (six) hours as needed for wheezing or shortness of breath.  albuterol (PROVENTIL HFA;VENTOLIN HFA) 108 (90 Base) MCG/ACT inhaler Inhale 2 puffs into the lungs every 6 (six) hours as needed for wheezing or shortness of breath.      alendronate (FOSAMAX) 70 MG tablet TAKE (1) ONCE A WEEK 30 MINUTES BEFORE BREAKFAST WITH FULL GLASS OF WATER. DO NOT LIE DOWN AFTER. 4 tablet 0   budesonide-formoterol (SYMBICORT) 160-4.5 MCG/ACT inhaler Inhale 2 puffs into the lungs 2 (two) times daily.     cetirizine (ZYRTEC)  10 MG tablet      cholecalciferol (VITAMIN D) 1000 units tablet Take 1,000 Units by mouth daily.   1   gabapentin (NEURONTIN) 300 MG capsule Take 1 capsule (300 mg total) by mouth at bedtime. 90 capsule 3   hydrochlorothiazide (MICROZIDE) 12.5 MG capsule Take 12.5 mg by mouth daily.     Ipratropium-Albuterol (ALBUTEROL-IPRATROPIUM IN)      lisinopril (PRINIVIL,ZESTRIL) 10 MG tablet Take 10 mg by mouth daily.     metFORMIN (GLUCOPHAGE) 500 MG tablet      omeprazole (PRILOSEC) 40 MG capsule Take 40 mg by mouth daily.     predniSONE (STERAPRED UNI-PAK 21 TAB) 10 MG (21) TBPK tablet Take by mouth.     traMADol (ULTRAM) 50 MG tablet Take 1 tablet (50 mg total) by mouth every 6 (six) hours as needed for moderate pain. 20 tablet 0   No current facility-administered medications for this visit.    PHYSICAL EXAMINATION: ECOG PERFORMANCE STATUS: {CHL ONC ECOG PS:[phone]}  There were no vitals filed for this visit. There were no vitals filed for this visit.  GENERAL:alert, no distress and comfortable SKIN: skin color, texture, turgor are normal, no rashes or significant lesions EYES: normal, Conjunctiva are pink and non-injected, sclera clear OROPHARYNX:no exudate, no erythema and lips, buccal mucosa, and tongue normal  NECK: supple, thyroid normal size, non-tender, without nodularity LYMPH:  no palpable lymphadenopathy in the cervical, axillary or inguinal LUNGS: clear to auscultation and percussion with normal breathing effort HEART: regular rate & rhythm and no murmurs and no lower extremity edema ABDOMEN:abdomen soft, non-tender and normal bowel sounds Musculoskeletal:no cyanosis of digits and no clubbing  NEURO: alert & oriented x 3 with fluent speech, no focal motor/sensory deficits  LABORATORY DATA:  I have reviewed the data as listed    Latest Ref Rng & Units 06/01/2021    8:15 AM 11/10/2020    1:03 PM 03/21/2020    9:08 AM  CBC  WBC 4.0 - 10.5 K/uL 6.8  6.0  7.2   Hemoglobin 12.0  - 15.0 g/dL 11.5  11.9  12.7   Hematocrit 36.0 - 46.0 % 37.1  38.0  40.7   Platelets 150 - 400 K/uL 209  199  182         Latest Ref Rng & Units 06/01/2021    8:15 AM 11/10/2020    1:03 PM 03/21/2020    9:08 AM  CMP  Glucose 70 - 99 mg/dL 141  111  154   BUN 8 - 23 mg/dL _0 Creatinine 0.44 - 1.00 mg/dL 1.47  1.12  1.23   Sodium 135 - 145 mmol/L 141  142  141   Potassium 3.5 - 5.1 mmol/L 4.0  4.1  3.8   Chloride 98 - 111 mmol/L 108  106  106   CO2 22 - 32 mmol/L _1 Calcium 8.9 - 10.3 mg/dL 10.6  10.2  10.8   Total Protein 6.5 -  8.1 g/dL 7.5  6.9  7.2   Total Bilirubin 0.3 - 1.2 mg/dL 0.3  0.2  0.3   Alkaline Phos 38 - 126 U/L 86  91  91   AST 15 - 41 U/L _0 ALT 0 - 44 U/L _1 RADIOGRAPHIC STUDIES: I have personally reviewed the radiological images as listed and agreed with the findings in the report. No results found.   ASSESSMENT & PLAN:  No problem-specific Assessment & Plan notes found for this encounter.   No orders of the defined types were placed in this encounter.  All questions were answered. The patient knows to call the clinic with any problems, questions or concerns. No barriers to learning was detected. I spent {CHL ONC TIME VISIT - PYKDX:[id number]} counseling the patient face to face. The total time spent in the appointment was {CHL ONC TIME VISIT - JQBHA:[id number]} and more than 50% was on counseling and review of test results     Alla Feeling, NP 03/28/22

## 2022-03-29 ENCOUNTER — Inpatient Hospital Stay (HOSPITAL_BASED_OUTPATIENT_CLINIC_OR_DEPARTMENT_OTHER): Payer: Medicare Other | Admitting: Nurse Practitioner

## 2022-03-29 ENCOUNTER — Encounter: Payer: Self-pay | Admitting: Nurse Practitioner

## 2022-03-29 ENCOUNTER — Inpatient Hospital Stay: Payer: Medicare Other | Attending: Nurse Practitioner

## 2022-03-29 ENCOUNTER — Other Ambulatory Visit: Payer: Self-pay

## 2022-03-29 VITALS — BP 151/76 | HR 110 | Temp 97.8°F | Resp 15 | Wt 152.1 lb

## 2022-03-29 DIAGNOSIS — G629 Polyneuropathy, unspecified: Secondary | ICD-10-CM | POA: Insufficient documentation

## 2022-03-29 DIAGNOSIS — Z171 Estrogen receptor negative status [ER-]: Secondary | ICD-10-CM | POA: Diagnosis not present

## 2022-03-29 DIAGNOSIS — Z08 Encounter for follow-up examination after completed treatment for malignant neoplasm: Secondary | ICD-10-CM | POA: Diagnosis not present

## 2022-03-29 DIAGNOSIS — C50112 Malignant neoplasm of central portion of left female breast: Secondary | ICD-10-CM

## 2022-03-29 DIAGNOSIS — D649 Anemia, unspecified: Secondary | ICD-10-CM | POA: Insufficient documentation

## 2022-03-29 DIAGNOSIS — Z853 Personal history of malignant neoplasm of breast: Secondary | ICD-10-CM | POA: Insufficient documentation

## 2022-03-29 DIAGNOSIS — Z79899 Other long term (current) drug therapy: Secondary | ICD-10-CM | POA: Insufficient documentation

## 2022-03-29 DIAGNOSIS — M8580 Other specified disorders of bone density and structure, unspecified site: Secondary | ICD-10-CM | POA: Diagnosis not present

## 2022-03-29 LAB — CBC WITH DIFFERENTIAL/PLATELET
Abs Immature Granulocytes: 0.01 10*3/uL (ref 0.00–0.07)
Basophils Absolute: 0 10*3/uL (ref 0.0–0.1)
Basophils Relative: 1 %
Eosinophils Absolute: 0.1 10*3/uL (ref 0.0–0.5)
Eosinophils Relative: 2 %
HCT: 37 % (ref 36.0–46.0)
Hemoglobin: 11.7 g/dL — ABNORMAL LOW (ref 12.0–15.0)
Immature Granulocytes: 0 %
Lymphocytes Relative: 22 %
Lymphs Abs: 1.3 10*3/uL (ref 0.7–4.0)
MCH: 27.8 pg (ref 26.0–34.0)
MCHC: 31.6 g/dL (ref 30.0–36.0)
MCV: 87.9 fL (ref 80.0–100.0)
Monocytes Absolute: 0.5 10*3/uL (ref 0.1–1.0)
Monocytes Relative: 8 %
Neutro Abs: 3.8 10*3/uL (ref 1.7–7.7)
Neutrophils Relative %: 67 %
Platelets: 156 10*3/uL (ref 150–400)
RBC: 4.21 MIL/uL (ref 3.87–5.11)
RDW: 13.2 % (ref 11.5–15.5)
WBC: 5.6 10*3/uL (ref 4.0–10.5)
nRBC: 0 % (ref 0.0–0.2)

## 2022-03-29 LAB — COMPREHENSIVE METABOLIC PANEL
ALT: 13 U/L (ref 0–44)
AST: 15 U/L (ref 15–41)
Albumin: 3.9 g/dL (ref 3.5–5.0)
Alkaline Phosphatase: 52 U/L (ref 38–126)
Anion gap: 7 (ref 5–15)
BUN: 17 mg/dL (ref 8–23)
CO2: 28 mmol/L (ref 22–32)
Calcium: 10 mg/dL (ref 8.9–10.3)
Chloride: 104 mmol/L (ref 98–111)
Creatinine, Ser: 1.32 mg/dL — ABNORMAL HIGH (ref 0.44–1.00)
GFR, Estimated: 44 mL/min — ABNORMAL LOW (ref >60)
Glucose, Bld: 216 mg/dL — ABNORMAL HIGH (ref 70–99)
Potassium: 3.7 mmol/L (ref 3.5–5.1)
Sodium: 139 mmol/L (ref 135–145)
Total Bilirubin: 0.4 mg/dL (ref 0.3–1.2)
Total Protein: 7.3 g/dL (ref 6.5–8.1)

## 2022-03-30 ENCOUNTER — Inpatient Hospital Stay: Payer: Medicare Other

## 2022-03-30 ENCOUNTER — Telehealth: Payer: Self-pay | Admitting: Hematology

## 2022-03-30 ENCOUNTER — Inpatient Hospital Stay: Payer: Medicare Other | Admitting: Nurse Practitioner

## 2022-03-30 NOTE — Telephone Encounter (Signed)
Scheduled follow-up appointment per 7/10 los. Patient is aware. Mailed calendar.

## 2022-04-06 ENCOUNTER — Other Ambulatory Visit: Payer: Self-pay | Admitting: Nurse Practitioner

## 2022-04-06 DIAGNOSIS — M85851 Other specified disorders of bone density and structure, right thigh: Secondary | ICD-10-CM

## 2022-04-12 ENCOUNTER — Other Ambulatory Visit: Payer: Self-pay

## 2022-04-14 ENCOUNTER — Other Ambulatory Visit: Payer: Self-pay

## 2022-04-20 ENCOUNTER — Other Ambulatory Visit: Payer: Self-pay

## 2022-05-01 ENCOUNTER — Other Ambulatory Visit: Payer: Self-pay

## 2022-06-24 ENCOUNTER — Other Ambulatory Visit: Payer: Self-pay

## 2022-06-24 DIAGNOSIS — M85851 Other specified disorders of bone density and structure, right thigh: Secondary | ICD-10-CM

## 2022-06-24 MED ORDER — ALENDRONATE SODIUM 70 MG PO TABS: 70.0000 mg | ORAL_TABLET | ORAL | 0 refills | Status: DC

## 2022-07-07 ENCOUNTER — Ambulatory Visit (INDEPENDENT_AMBULATORY_CARE_PROVIDER_SITE_OTHER): Payer: Medicare Other | Admitting: Orthopedic Surgery

## 2022-07-07 ENCOUNTER — Encounter: Payer: Self-pay | Admitting: Orthopedic Surgery

## 2022-07-07 DIAGNOSIS — S62625A Displaced fracture of medial phalanx of left ring finger, initial encounter for closed fracture: Secondary | ICD-10-CM | POA: Diagnosis not present

## 2022-07-07 DIAGNOSIS — M79642 Pain in left hand: Secondary | ICD-10-CM

## 2022-07-07 DIAGNOSIS — S62623A Displaced fracture of medial phalanx of left middle finger, initial encounter for closed fracture: Secondary | ICD-10-CM

## 2022-07-07 NOTE — Patient Instructions (Signed)
Buddy taping  Passive ROM of the PIP joints  Follow up in 2 weeks

## 2022-07-08 ENCOUNTER — Ambulatory Visit (INDEPENDENT_AMBULATORY_CARE_PROVIDER_SITE_OTHER): Payer: Medicare Other

## 2022-07-08 DIAGNOSIS — M79642 Pain in left hand: Secondary | ICD-10-CM | POA: Diagnosis not present
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
## 2022-07-21 ENCOUNTER — Ambulatory Visit: Payer: Medicare Other | Admitting: Orthopedic Surgery

## 2022-07-26 ENCOUNTER — Other Ambulatory Visit: Payer: Self-pay | Admitting: Hematology

## 2022-08-18 ENCOUNTER — Encounter: Payer: Self-pay | Admitting: Orthopedic Surgery

## 2022-08-18 ENCOUNTER — Ambulatory Visit (INDEPENDENT_AMBULATORY_CARE_PROVIDER_SITE_OTHER): Payer: Medicare Other | Admitting: Orthopedic Surgery

## 2022-08-18 ENCOUNTER — Ambulatory Visit (INDEPENDENT_AMBULATORY_CARE_PROVIDER_SITE_OTHER): Payer: Medicare Other

## 2022-08-18 VITALS — Ht 66.0 in | Wt 152.0 lb

## 2022-08-18 DIAGNOSIS — M79642 Pain in left hand: Secondary | ICD-10-CM

## 2022-08-18 DIAGNOSIS — S62629D Displaced fracture of medial phalanx of unspecified finger, subsequent encounter for fracture with routine healing: Secondary | ICD-10-CM

## 2022-08-19 ENCOUNTER — Encounter: Payer: Self-pay | Admitting: Orthopedic Surgery

## 2022-08-19 ENCOUNTER — Ambulatory Visit: Payer: Medicare Other | Admitting: Orthopaedic Surgery
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
## 2022-09-10 ENCOUNTER — Other Ambulatory Visit: Payer: Self-pay | Admitting: Nurse Practitioner

## 2022-09-10 DIAGNOSIS — M85851 Other specified disorders of bone density and structure, right thigh: Secondary | ICD-10-CM

## 2022-09-29 ENCOUNTER — Inpatient Hospital Stay: Payer: Medicare Other | Attending: Nurse Practitioner | Admitting: Nurse Practitioner

## 2022-09-29 ENCOUNTER — Inpatient Hospital Stay: Payer: Medicare Other

## 2022-09-29 ENCOUNTER — Other Ambulatory Visit: Payer: Self-pay

## 2022-09-29 ENCOUNTER — Encounter: Payer: Self-pay | Admitting: Nurse Practitioner

## 2022-09-29 VITALS — BP 118/68 | HR 90 | Temp 98.5°F | Resp 16 | Ht 66.0 in | Wt 157.4 lb

## 2022-09-29 DIAGNOSIS — Z171 Estrogen receptor negative status [ER-]: Secondary | ICD-10-CM

## 2022-09-29 DIAGNOSIS — M85851 Other specified disorders of bone density and structure, right thigh: Secondary | ICD-10-CM

## 2022-09-29 DIAGNOSIS — Z08 Encounter for follow-up examination after completed treatment for malignant neoplasm: Secondary | ICD-10-CM | POA: Insufficient documentation

## 2022-09-29 DIAGNOSIS — Z853 Personal history of malignant neoplasm of breast: Secondary | ICD-10-CM | POA: Insufficient documentation

## 2022-09-29 DIAGNOSIS — C50112 Malignant neoplasm of central portion of left female breast: Secondary | ICD-10-CM | POA: Diagnosis not present

## 2022-09-29 LAB — CMP (CANCER CENTER ONLY)
ALT: 11 U/L (ref 0–44)
AST: 15 U/L (ref 15–41)
Albumin: 3.8 g/dL (ref 3.5–5.0)
Alkaline Phosphatase: 71 U/L (ref 38–126)
Anion gap: 6 (ref 5–15)
BUN: 14 mg/dL (ref 8–23)
CO2: 32 mmol/L (ref 22–32)
Calcium: 10.1 mg/dL (ref 8.9–10.3)
Chloride: 102 mmol/L (ref 98–111)
Creatinine: 1.16 mg/dL — ABNORMAL HIGH (ref 0.44–1.00)
GFR, Estimated: 52 mL/min — ABNORMAL LOW (ref >60)
Glucose, Bld: 156 mg/dL — ABNORMAL HIGH (ref 70–99)
Potassium: 3.7 mmol/L (ref 3.5–5.1)
Sodium: 140 mmol/L (ref 135–145)
Total Bilirubin: 0.3 mg/dL (ref 0.3–1.2)
Total Protein: 7 g/dL (ref 6.5–8.1)

## 2022-09-29 LAB — CBC WITH DIFFERENTIAL (CANCER CENTER ONLY)
Abs Immature Granulocytes: 0.01 10*3/uL (ref 0.00–0.07)
Basophils Absolute: 0 10*3/uL (ref 0.0–0.1)
Basophils Relative: 1 %
Eosinophils Absolute: 0.2 10*3/uL (ref 0.0–0.5)
Eosinophils Relative: 3 %
HCT: 39.1 % (ref 36.0–46.0)
Hemoglobin: 12.3 g/dL (ref 12.0–15.0)
Immature Granulocytes: 0 %
Lymphocytes Relative: 23 %
Lymphs Abs: 1.4 10*3/uL (ref 0.7–4.0)
MCH: 28.3 pg (ref 26.0–34.0)
MCHC: 31.5 g/dL (ref 30.0–36.0)
MCV: 89.9 fL (ref 80.0–100.0)
Monocytes Absolute: 0.5 10*3/uL (ref 0.1–1.0)
Monocytes Relative: 9 %
Neutro Abs: 3.7 10*3/uL (ref 1.7–7.7)
Neutrophils Relative %: 64 %
Platelet Count: 223 10*3/uL (ref 150–400)
RBC: 4.35 MIL/uL (ref 3.87–5.11)
RDW: 13.2 % (ref 11.5–15.5)
WBC Count: 5.8 10*3/uL (ref 4.0–10.5)
nRBC: 0 % (ref 0.0–0.2)
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
## 2022-09-29 NOTE — Addendum Note (Signed)
Addended by: Alla Feeling on: 09/29/2022 10:13 AM   Modules accepted: Level of Service

## 2022-09-30 ENCOUNTER — Telehealth: Payer: Self-pay | Admitting: Hematology

## 2022-09-30 NOTE — Telephone Encounter (Signed)
Patient aware of upcoming appointments  

## 2022-10-28 ENCOUNTER — Other Ambulatory Visit (HOSPITAL_COMMUNITY): Payer: Self-pay | Admitting: Nephrology

## 2022-10-28 DIAGNOSIS — I129 Hypertensive chronic kidney disease with stage 1 through stage 4 chronic kidney disease, or unspecified chronic kidney disease: Secondary | ICD-10-CM

## 2022-10-28 DIAGNOSIS — I5022 Chronic systolic (congestive) heart failure: Secondary | ICD-10-CM

## 2022-10-28 DIAGNOSIS — E1122 Type 2 diabetes mellitus with diabetic chronic kidney disease: Secondary | ICD-10-CM

## 2022-10-28 DIAGNOSIS — Z9221 Personal history of antineoplastic chemotherapy: Secondary | ICD-10-CM

## 2022-10-28 DIAGNOSIS — R319 Hematuria, unspecified: Secondary | ICD-10-CM

## 2022-10-28 DIAGNOSIS — C50112 Malignant neoplasm of central portion of left female breast: Secondary | ICD-10-CM

## 2022-11-05 ENCOUNTER — Ambulatory Visit (HOSPITAL_COMMUNITY)
Admission: RE | Admit: 2022-11-05 | Discharge: 2022-11-05 | Disposition: A | Discharge status: Home / Self Care | Payer: 59 | Source: Ambulatory Visit | Attending: Nephrology | Admitting: Nephrology

## 2022-11-05 DIAGNOSIS — I5022 Chronic systolic (congestive) heart failure: Secondary | ICD-10-CM

## 2022-11-05 DIAGNOSIS — I129 Hypertensive chronic kidney disease with stage 1 through stage 4 chronic kidney disease, or unspecified chronic kidney disease: Secondary | ICD-10-CM | POA: Diagnosis present

## 2022-11-05 DIAGNOSIS — E1122 Type 2 diabetes mellitus with diabetic chronic kidney disease: Secondary | ICD-10-CM

## 2022-11-05 DIAGNOSIS — Z9221 Personal history of antineoplastic chemotherapy: Secondary | ICD-10-CM

## 2022-11-05 DIAGNOSIS — C50112 Malignant neoplasm of central portion of left female breast: Secondary | ICD-10-CM | POA: Diagnosis present

## 2022-11-05 DIAGNOSIS — R319 Hematuria, unspecified: Secondary | ICD-10-CM

## 2022-11-05 DIAGNOSIS — N182 Chronic kidney disease, stage 2 (mild): Secondary | ICD-10-CM | POA: Insufficient documentation

## 2023-02-10 ENCOUNTER — Encounter: Payer: Self-pay | Admitting: Urology

## 2023-02-10 ENCOUNTER — Ambulatory Visit (INDEPENDENT_AMBULATORY_CARE_PROVIDER_SITE_OTHER): Payer: 59 | Admitting: Urology

## 2023-02-10 VITALS — BP 157/81 | HR 88

## 2023-02-10 DIAGNOSIS — R3129 Other microscopic hematuria: Secondary | ICD-10-CM

## 2023-02-10 LAB — MICROSCOPIC EXAMINATION: Bacteria, UA: NONE SEEN

## 2023-02-10 LAB — URINALYSIS, ROUTINE W REFLEX MICROSCOPIC
Bilirubin, UA: NEGATIVE
Ketones, UA: NEGATIVE
Nitrite, UA: NEGATIVE
Protein,UA: NEGATIVE
Specific Gravity, UA: 1.01 (ref 1.005–1.030)
Urobilinogen, Ur: 0.2 mg/dL (ref 0.2–1.0)
pH, UA: 5.5 (ref 5.0–7.5)

## 2023-02-10 NOTE — Progress Notes (Signed)
Subjective: 1. Microhematuria      Consult requested by Dr. Celso Amy.  Jennifer Johns is a 67 yo female who is sent for microhematuria with 10-20 RBC's on a recent UA.  She has had no gross hematuria or voiding complaints.  She had a stone remotely.  She has had no UTI's or GU surgery.  She has a history of breast cancer treated with 2020 with a right lumpectomy and chemo/radiation.   She had cyclophosphomide as part of her treatment.   She had a renal US that showed bilateral renal atrophy but was otherwise normal. She is a former smoker and quit in 2019.   She has no incontinence and has had no flank pain.  UA has 6-10 WBC and 3-10 RBC without bacteria. ROS:  Review of Systems  All other systems reviewed and are negative.   Allergies  Allergen Reactions   Carrot [Daucus Carota] Hives and Shortness Of Breath   Other Hives    "chitlins" or pig intestine   Zantac [Ranitidine Hcl] Rash    Past Medical History:  Diagnosis Date   Asthma    Breast cancer ([facility])    left breast   Cancer ([facility])    Left Breast   COPD (chronic obstructive pulmonary disease) ([facility])    Hypertension    Personal history of chemotherapy    2019   Personal history of radiation therapy    2019    Past Surgical History:  Procedure Laterality Date   BREAST BIOPSY Left    BREAST LUMPECTOMY Left 06/07/2018   Malignant   COLONOSCOPY N/A 03/18/2016   Procedure: COLONOSCOPY;  Surgeon: West Bali, MD;  [facility];  Service: Endoscopy;  Laterality: N/A;  10:30 AM   COLONOSCOPY N/A 07/02/2019   Procedure: COLONOSCOPY;  Surgeon: West Bali, MD;  [facility];  Service: Endoscopy;  Laterality: N/A;  2:00   PARTIAL MASTECTOMY WITH AXILLARY SENTINEL LYMPH NODE BIOPSY Left 06/07/2018   Procedure: LEFT BREAST PARTIAL MASTECTOMY WITH AXILLARY SENTINEL LYMPH NODE BIOPSY;  Surgeon: Abigail Miyamoto, MD;  [facility];  Service: General;  Laterality: Left;   POLYPECTOMY  07/02/2019    Procedure: POLYPECTOMY;  Surgeon: West Bali, MD;  [facility];  Service: Endoscopy;;   PORT-A-CATH REMOVAL N/A 07/19/2019   Procedure: REMOVAL PORT-A-CATH;  Surgeon: Abigail Miyamoto, MD;  Location: [facility];  Service: General;  Laterality: N/A;   PORTACATH PLACEMENT N/A 06/07/2018   Procedure: INSERTION PORT-A-CATH;  Surgeon: Abigail Miyamoto, MD;  [facility];  Service: General;  Laterality: N/A;    Social History   Socioeconomic History   Marital status: Single    Spouse name: Not on file   Number of children: 3   Years of education: Not on file   Highest education level: Not on file  Occupational History    Comment: disabled.  Tobacco Use   Smoking status: Former    Packs/day: 0.50    Years: 45.00    Additional pack years: 0.00    Total pack years: 22.50    Types: Cigarettes    Quit date: 08/20/2018    Years since quitting: 4.4   Smokeless tobacco: Never  Vaping Use   Vaping Use: Never used  Substance and Sexual Activity   Alcohol use: No   Drug use: No   Sexual activity: Not Currently  Other Topics Concern   Not on file  Social History Narrative   3 children total but  one passed. Reports her granddaughter lives with her.    Social Determinants of Health   Financial Resource Strain: Not on file  Food Insecurity: Not on file  Transportation Needs: Not on file  Physical Activity: Not on file  Stress: Not on file  Social Connections: Not on file  Intimate Partner Violence: Not on file    Family History  Problem Relation Age of Onset   Breast cancer Mother    Prostate cancer Father     Anti-infectives: Anti-infectives (From admission, onward)    None       Current Outpatient Medications  Medication Sig Dispense Refill   albuterol (ACCUNEB) 1.25 MG/3ML nebulizer solution Take 1 ampule by nebulization every 6 (six) hours as needed for wheezing or shortness of breath.      alendronate (FOSAMAX) 70 MG tablet TAKE ONE  TABLET BY MOUTH WEEKLY 30 minutes BEFORE breakfast; DO not lie down FOR 30 minutes AFTER taking. 4 tablet 0   aspirin 81 MG chewable tablet Chew 81 mg by mouth daily.     atorvastatin (LIPITOR) 20 MG tablet Take 20 mg by mouth daily.     budesonide-formoterol (SYMBICORT) 160-4.5 MCG/ACT inhaler Inhale 2 puffs into the lungs 2 (two) times daily.     chlorhexidine (PERIDEX) 0.12 % solution 15 mLs 2 (two) times daily.     cholecalciferol (VITAMIN D) 1000 units tablet Take 1,000 Units by mouth daily.   1   gabapentin (NEURONTIN) 300 MG capsule Take 1 capsule (300 mg total) by mouth at bedtime. 90 capsule 3   hydrochlorothiazide (MICROZIDE) 12.5 MG capsule Take 12.5 mg by mouth daily.     Ipratropium-Albuterol (ALBUTEROL-IPRATROPIUM IN)      lisinopril (PRINIVIL,ZESTRIL) 10 MG tablet Take 10 mg by mouth daily.     metFORMIN (GLUCOPHAGE) 500 MG tablet      omeprazole (PRILOSEC) 40 MG capsule Take 40 mg by mouth daily.     traMADol (ULTRAM) 50 MG tablet Take 1 tablet (50 mg total) by mouth every 6 (six) hours as needed for moderate pain. 20 tablet 0   JARDIANCE 25 MG TABS tablet Take 25 mg by mouth daily.     No current facility-administered medications for this visit.     Objective: Vital signs in last 24 hours: BP (!) 157/81   Pulse 88   Intake/Output from previous day: No intake/output data recorded. Intake/Output this shift: @IOTHISSHIFT @   Physical Exam Vitals reviewed.  Constitutional:      Appearance: Normal appearance.  Cardiovascular:     Rate and Rhythm: Normal rate and regular rhythm.     Heart sounds: Normal heart sounds.  Pulmonary:     Effort: Pulmonary effort is normal. No respiratory distress.     Breath sounds: Normal breath sounds.  Abdominal:     General: Abdomen is flat.     Palpations: Abdomen is soft. There is no mass.     Tenderness: There is no abdominal tenderness.  Musculoskeletal:        General: No swelling. Normal range of motion.     Cervical back:  Normal range of motion and neck supple.  Lymphadenopathy:     Cervical: No cervical adenopathy.     Upper Body:     Right upper body: No supraclavicular adenopathy.     Left upper body: No supraclavicular adenopathy.  Skin:    General: Skin is warm and dry.  Neurological:     General: No focal deficit present.     Mental  Status: She is alert and oriented to person, place, and time.     Lab Results:  No results found for this or any previous visit (from the past 24 hour(s)).  BMET No results for input(s): "NA", "K", "CL", "CO2", "GLUCOSE", "BUN", "CREATININE", "CALCIUM" in the last 72 hours. PT/INR No results for input(s): "LABPROT", "INR" in the last 72 hours. ABG No results for input(s): "PHART", "HCO3" in the last 72 hours.  Invalid input(s): "PCO2", "PO2"  Studies/Results: No results found.   Assessment/Plan: No problem-specific Assessment & Plan notes found for this encounter.   No orders of the defined types were placed in this encounter.    Orders Placed This Encounter  Procedures   Microscopic Examination   CT HEMATURIA WORKUP    Standing Status:   Future    Standing Expiration Date:   08/13/2023    Order Specific Question:   Reason for Exam (SYMPTOM  OR DIAGNOSIS REQUIRED)    Answer:   microhematuria.    Order Specific Question:   Preferred imaging location?    Answer:   [facility]    Order Specific Question:   Radiology Contrast Protocol - do NOT remove file path    Answer:   [email or web address]   Urinalysis, Routine w reflex microscopic   Basic metabolic panel   Cystoscopy    Standing Status:   Future    Standing Expiration Date:   06/13/2023     Return for Next available for cystoscopy.    CC: Dr. Celso Amy and Coral Ceo FNP     Bjorn Pippin 02/11/2023

## 2023-03-16 ENCOUNTER — Ambulatory Visit (HOSPITAL_COMMUNITY)
Admission: RE | Admit: 2023-03-16 | Discharge: 2023-03-16 | Disposition: A | Discharge status: Home / Self Care | Payer: 59 | Source: Ambulatory Visit | Attending: Urology | Admitting: Urology

## 2023-03-16 ENCOUNTER — Encounter (HOSPITAL_COMMUNITY): Payer: Self-pay | Admitting: Radiology

## 2023-03-16 DIAGNOSIS — R3129 Other microscopic hematuria: Secondary | ICD-10-CM | POA: Insufficient documentation

## 2023-03-16 LAB — POCT I-STAT CREATININE: Creatinine, Ser: 1.4 mg/dL — ABNORMAL HIGH (ref 0.44–1.00)

## 2023-03-16 MED ORDER — IOHEXOL 300 MG/ML  SOLN
100.0000 mL | Freq: Once | INTRAMUSCULAR | Status: AC | PRN
  Administered 2023-03-16: 100 mL via INTRAVENOUS

## 2023-03-31 ENCOUNTER — Inpatient Hospital Stay: Payer: 59 | Attending: Hematology

## 2023-03-31 ENCOUNTER — Inpatient Hospital Stay (HOSPITAL_BASED_OUTPATIENT_CLINIC_OR_DEPARTMENT_OTHER): Payer: 59 | Admitting: Hematology

## 2023-03-31 ENCOUNTER — Encounter: Payer: Self-pay | Admitting: Hematology

## 2023-03-31 ENCOUNTER — Other Ambulatory Visit: Payer: Self-pay

## 2023-03-31 VITALS — BP 166/74 | HR 80 | Temp 98.4°F | Resp 16 | Ht 66.0 in | Wt 157.7 lb

## 2023-03-31 DIAGNOSIS — Z08 Encounter for follow-up examination after completed treatment for malignant neoplasm: Secondary | ICD-10-CM | POA: Insufficient documentation

## 2023-03-31 DIAGNOSIS — Z853 Personal history of malignant neoplasm of breast: Secondary | ICD-10-CM | POA: Diagnosis present

## 2023-03-31 DIAGNOSIS — C50112 Malignant neoplasm of central portion of left female breast: Secondary | ICD-10-CM

## 2023-03-31 DIAGNOSIS — Z171 Estrogen receptor negative status [ER-]: Secondary | ICD-10-CM

## 2023-03-31 LAB — CBC WITH DIFFERENTIAL (CANCER CENTER ONLY)
Abs Immature Granulocytes: 0.01 10*3/uL (ref 0.00–0.07)
Basophils Absolute: 0.1 10*3/uL (ref 0.0–0.1)
Basophils Relative: 1 %
Eosinophils Absolute: 0.1 10*3/uL (ref 0.0–0.5)
Eosinophils Relative: 2 %
HCT: 39 % (ref 36.0–46.0)
Hemoglobin: 12.3 g/dL (ref 12.0–15.0)
Immature Granulocytes: 0 %
Lymphocytes Relative: 24 %
Lymphs Abs: 1.7 10*3/uL (ref 0.7–4.0)
MCH: 28.5 pg (ref 26.0–34.0)
MCHC: 31.5 g/dL (ref 30.0–36.0)
MCV: 90.3 fL (ref 80.0–100.0)
Monocytes Absolute: 0.7 10*3/uL (ref 0.1–1.0)
Monocytes Relative: 9 %
Neutro Abs: 4.6 10*3/uL (ref 1.7–7.7)
Neutrophils Relative %: 64 %
Platelet Count: 211 10*3/uL (ref 150–400)
RBC: 4.32 MIL/uL (ref 3.87–5.11)
RDW: 13.4 % (ref 11.5–15.5)
WBC Count: 7.2 10*3/uL (ref 4.0–10.5)
nRBC: 0 % (ref 0.0–0.2)

## 2023-03-31 LAB — CMP (CANCER CENTER ONLY)
ALT: 12 U/L (ref 0–44)
AST: 16 U/L (ref 15–41)
Albumin: 3.7 g/dL (ref 3.5–5.0)
Alkaline Phosphatase: 50 U/L (ref 38–126)
Anion gap: 5 (ref 5–15)
BUN: 20 mg/dL (ref 8–23)
CO2: 30 mmol/L (ref 22–32)
Calcium: 10 mg/dL (ref 8.9–10.3)
Chloride: 105 mmol/L (ref 98–111)
Creatinine: 1.21 mg/dL — ABNORMAL HIGH (ref 0.44–1.00)
GFR, Estimated: 49 mL/min — ABNORMAL LOW (ref >60)
Glucose, Bld: 120 mg/dL — ABNORMAL HIGH (ref 70–99)
Potassium: 3.6 mmol/L (ref 3.5–5.1)
Sodium: 140 mmol/L (ref 135–145)
Total Bilirubin: 0.4 mg/dL (ref 0.3–1.2)
Total Protein: 7.1 g/dL (ref 6.5–8.1)

## 2023-03-31 NOTE — Progress Notes (Signed)
[facility]   [phone]) [phone] [phone]   Clinic Follow up Note   Patient Care Team: Wilmon Pali, FNP as PCP - General (Family Medicine) Abigail Miyamoto, MD as Consulting Physician (General Surgery) Malachy Mood, MD as Consulting Physician (Hematology) Pershing Proud, RN as Oncology Nurse Navigator Donnelly Angelica, RN as Oncology Nurse Navigator Pollyann Samples, NP as Nurse Practitioner (Nurse Practitioner) Dorothy Puffer, MD as Consulting Physician (Radiation Oncology)  Date of Service:  03/31/2023  CHIEF COMPLAINT: f/u of  left breast cancer   CURRENT THERAPY:  Surveillance   ASSESSMENT:  Jennifer Johns is a 67 y.o. 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 Treated with left lumpectomy, adjuvant chemo with AC-T and Adjuvant radiation.  -She is on cancer surveillance. -She is clinically doing well. Lab reviewed, her CBC and CMP are within normal limits except Hg 11.9, BG 111, Cr 1.12. Her physical exam and her 08/2020 mammogram were unremarkable except mild left breast lymphedema. There is no clinical concern for recurrence. -Continue Surveillance. Next mammogram scheduled 04/20/2023 -she is near 5 years since her initial diagnosis, risk of recurrence is minimal now, we will see her back as needed.    PLAN: -She is clinically doing well, exam was unremarkable, no clinical concern for recurrence. -He has completed breast cancer surveillance, I will see her as needed in future    SUMMARY OF ONCOLOGIC HISTORY: Oncology History Overview Note  Cancer Staging Cancer of central portion of left female breast (HCC) Staging form: Breast, AJCC 8th Edition - Clinical stage from 04/28/2018: Stage IB (cT1c, cN0, cM0, G3, ER-, PR-, HER2-) - Signed by Malachy Mood, MD on 05/24/2018 Histologic grading system: 3 grade system - Pathologic stage from 06/02/2018: Stage IIA  (pT2, pN0, cM0, G3, ER-, PR-, HER2-) - Signed by Malachy Mood, MD on 06/23/2018 Neoadjuvant therapy: No Nuclear grade: G3 Multigene prognostic tests performed: None Histologic grading system: 3 grade system Residual tumor (R): R0 - None Laterality: Left    Cancer of central portion of left female breast (HCC)  04/28/2018 Cancer Staging   Staging form: Breast, AJCC 8th Edition - Clinical stage from 04/28/2018: Stage IB (cT1c, cN0, cM0, G3, ER-, PR-, HER2-) - Signed by Malachy Mood, MD on 05/24/2018   05/24/2018 Initial Diagnosis   Cancer of central portion of left female breast (HCC)   06/02/2018 Cancer Staging   Staging form: Breast, AJCC 8th Edition - Pathologic stage from 06/02/2018: Stage IIA (pT2, pN0, cM0, G3, ER-, PR-, HER2-) - Signed by Malachy Mood, MD on 06/23/2018   06/07/2018 Surgery   LEFT BREAST PARTIAL MASTECTOMY WITH AXILLARY SENTINEL LYMPH NODE BIOPSY and PAC placement by Dr. Magnus Ivan  06/07/18   06/07/2018 Pathology Results   Diagnosis 06/07/18 1. Breast, partial mastectomy, Left - INVASIVE DUCTAL CARCINOMA, GRADE III, 2.1 CM 1 of 4 FINAL for ALLENE, FURUYA J [id number]) Diagnosis(continued) - SURGICAL RESECTION MARGINS ARE NEGATIVE FOR CARCINOMA. - NEGATIVE FOR LYMPHOVASCULAR OR PERINEURAL INVASION. - BIOPSY SITE CHANGES. - SEE ONCOLOGY TABLE. - SEE NOTE. 2. Lymph node, sentinel, biopsy, Left Axillary - LYMPH NODE, NEGATIVE FOR CARCINOMA (0/1). 3. Lymph node, sentinel, biopsy, Left - LYMPH NODE, NEGATIVE FOR CARCINOMA (0/1). 4. Lymph node, sentinel, biopsy, Left - LYMPH NODE, NEGATIVE FOR CARCINOMA (0/1). 5. Lymph node, sentinel, biopsy, Left - LYMPH NODE, NEGATIVE FOR CARCINOMA (0/1). 6. Lymph node, sentinel, biopsy, Left - LYMPH NODE,  NEGATIVE FOR CARCINOMA (0/1). 7. Lymph node, sentinel, biopsy, Left - LYMPH NODE, NEGATIVE FOR CARCINOMA (0/1). The tumor cells are Negative for Her2 (0). Estrogen Receptor: 0%, NEGATIVE Progesterone Receptor: 0%, NEGATIVE Proliferation  Marker Ki67: 40%   06/08/2018 Breast MRI   MRI Breast B/l 05/29/18 IMPRESSION: Solitary mass within the UPPER central portion of the LEFT breast, 2.3 Centimeters in maximum diameter. No MRI evidence for adenopathy. RIGHT breast is negative. LEFT BREAST PARTIAL MASTECTOMY WITH AXILLARY SENTINEL LYMPH NODE BIOPSY with PAC placement by Dr. Magnus Ivan 06/07/18   07/11/2018 Imaging   Whole Body Bone Scan 07/11/18  IMPRESSION: No scintigraphic evidence of osseous metastatic disease.   07/13/2018 -  Chemotherapy   Adjuvant AC every 2 weeks for 4 cycles, 07/13/18-08/24/18. Followed by Taxol every 2 weeks starting 09/11/18-11/30/18   08/20/2018 Imaging   CT AP W Contrast 08/20/18  IMPRESSION: No acute findings in the abdomen/pelvis. Mild prominence of the main pancreatic duct measuring 6 mm. No evidence of mass, adenopathy or ductal stones. Subtle focal nodularity over the posterior left lower lobe which may be acute or chronic and may be due to an infectious versus atypical infectious or inflammatory process. Recommend follow-up chest CT 4-6 weeks. Two subcentimeter liver hypodensities too small to characterize but likely cysts. Aortic Atherosclerosis (ICD10-I70.0).   01/15/2019 - 02/09/2019 Radiation Therapy   01/15/2019-02/09/2019:  The left breast was treated to 50.56 Gy in 16 fractions followed by a 8 Gy boost in 4 fractions.20 Per Dr. Mitzi Hansen   03/29/2019 Survivorship   Per Santiago Glad, NP    07/19/2019 Procedure   She previously had PAC removed on 07/19/19.       INTERVAL HISTORY:  Jennifer Johns is here for a follow up of  left breast cancer . She was last seen by NP Lacie in January 2024.  She presents to clinic alone.  She is doing very well, denies any concerns of her breasts, no pain or other discomfort.  She has good appetite and energy level, weight is stable.  All other systems were reviewed with the patient and are negative.  MEDICAL HISTORY:  Past Medical History:   Diagnosis Date   Asthma    Breast cancer (HCC)    left breast   Cancer (HCC)    Left Breast   COPD (chronic obstructive pulmonary disease) (HCC)    Hypertension    Personal history of chemotherapy    2019   Personal history of radiation therapy    2019    SURGICAL HISTORY: Past Surgical History:  Procedure Laterality Date   BREAST BIOPSY Left    BREAST LUMPECTOMY Left 06/07/2018   Malignant   COLONOSCOPY N/A 03/18/2016   Procedure: COLONOSCOPY;  Surgeon: West Bali, MD;  [facility]: [facility];  Service: Endoscopy;  Laterality: N/A;  10:30 AM   COLONOSCOPY N/A 07/02/2019   Procedure: COLONOSCOPY;  Surgeon: West Bali, MD;  [facility]: [facility];  Service: Endoscopy;  Laterality: N/A;  2:00   PARTIAL MASTECTOMY WITH AXILLARY SENTINEL LYMPH NODE BIOPSY Left 06/07/2018   Procedure: LEFT BREAST PARTIAL MASTECTOMY WITH AXILLARY SENTINEL LYMPH NODE BIOPSY;  Surgeon: Abigail Miyamoto, MD;  Location[facility];  Service: General;  Laterality: Left;   POLYPECTOMY  07/02/2019   Procedure: POLYPECTOMY;  Surgeon: West Bali, MD;  [facility]: [facility];  Service: Endoscopy;;   PORT-A-CATH REMOVAL N/A 07/19/2019   Procedure: REMOVAL PORT-A-CATH;  Surgeon: Abigail Miyamoto, MD;  Location: [facility];  Service: General;  Laterality: N/A;   PORTACATH PLACEMENT N/A 06/07/2018   Procedure: INSERTION PORT-A-CATH;  Surgeon: Abigail Miyamoto, MD;  Location[facility];  Service: General;  Laterality: N/A;    I have reviewed the social history and family history with the patient and they are unchanged from previous note.  ALLERGIES:  is allergic to carrot [daucus carota], other, and zantac [ranitidine hcl].  MEDICATIONS:  Current Outpatient Medications  Medication Sig Dispense Refill   albuterol (ACCUNEB) 1.25 MG/3ML nebulizer solution Take 1 ampule by nebulization every 6 (six) hours as needed for wheezing or shortness of breath.      alendronate (FOSAMAX) 70 MG tablet  TAKE ONE TABLET BY MOUTH WEEKLY 30 minutes BEFORE breakfast; DO not lie down FOR 30 minutes AFTER taking. 4 tablet 0   aspirin 81 MG chewable tablet Chew 81 mg by mouth daily.     atorvastatin (LIPITOR) 20 MG tablet Take 20 mg by mouth daily.     budesonide-formoterol (SYMBICORT) 160-4.5 MCG/ACT inhaler Inhale 2 puffs into the lungs 2 (two) times daily.     chlorhexidine (PERIDEX) 0.12 % solution 15 mLs 2 (two) times daily.     cholecalciferol (VITAMIN D) 1000 units tablet Take 1,000 Units by mouth daily.   1   gabapentin (NEURONTIN) 300 MG capsule Take 1 capsule (300 mg total) by mouth at bedtime. 90 capsule 3   hydrochlorothiazide (MICROZIDE) 12.5 MG capsule Take 12.5 mg by mouth daily.     Ipratropium-Albuterol (ALBUTEROL-IPRATROPIUM IN)      JARDIANCE 25 MG TABS tablet Take 25 mg by mouth daily.     lisinopril (PRINIVIL,ZESTRIL) 10 MG tablet Take 10 mg by mouth daily.     metFORMIN (GLUCOPHAGE) 500 MG tablet      omeprazole (PRILOSEC) 40 MG capsule Take 40 mg by mouth daily.     traMADol (ULTRAM) 50 MG tablet Take 1 tablet (50 mg total) by mouth every 6 (six) hours as needed for moderate pain. 20 tablet 0   No current facility-administered medications for this visit.    PHYSICAL EXAMINATION: ECOG PERFORMANCE STATUS: 0 - Asymptomatic  Vitals:   03/31/23 1209  BP: (!) 166/74  Pulse: 80  Resp: 16  Temp: 98.4 F (36.9 C)  SpO2: 96%   Wt Readings from Last 3 Encounters:  03/31/23 157 lb 11.2 oz (71.5 kg)  09/29/22 157 lb 6.4 oz (71.4 kg)  08/18/22 152 lb (68.9 kg)     GENERAL:alert, no distress and comfortable SKIN: skin color, texture, turgor are normal, no rashes or significant lesions EYES: normal, Conjunctiva are pink and non-injected, sclera clear NECK: supple, thyroid normal size, non-tender, without nodularity LYMPH:  no palpable lymphadenopathy in the cervical, axillary  LUNGS: clear to auscultation and percussion with normal breathing effort HEART: regular rate &  rhythm and no murmurs and no lower extremity edema ABDOMEN:abdomen soft, non-tender and normal bowel sounds Musculoskeletal:no cyanosis of digits and no clubbing  NEURO: alert & oriented x 3 with fluent speech, no focal motor/sensory deficits Breast exam: No palpable breast mass or adenopathy,  LABORATORY DATA:  I have reviewed the data as listed    Latest Ref Rng & Units 03/31/2023   11:53 AM 09/29/2022    8:38 AM 03/29/2022    8:43 AM  CBC  WBC 4.0 - 10.5 K/uL 7.2  5.8  5.6   Hemoglobin 12.0 - 15.0 g/dL 16.1  09.6  04.5   Hematocrit 36.0 - 46.0 % 39.0  39.1  37.0   Platelets 150 -  400 K/uL 211  223  156         Latest Ref Rng & Units 03/31/2023   11:53 AM 03/16/2023   10:05 AM 09/29/2022    8:38 AM  CMP  Glucose 70 - 99 mg/dL 161   096   BUN 8 - 23 mg/dL 20   14   Creatinine 0.45 - 1.00 mg/dL 4.09  8.11  9.14   Sodium 135 - 145 mmol/L 140   140   Potassium 3.5 - 5.1 mmol/L 3.6   3.7   Chloride 98 - 111 mmol/L 105   102   CO2 22 - 32 mmol/L 30   32   Calcium 8.9 - 10.3 mg/dL 78.2   95.6   Total Protein 6.5 - 8.1 g/dL 7.1   7.0   Total Bilirubin 0.3 - 1.2 mg/dL 0.4   0.3   Alkaline Phos 38 - 126 U/L 50   71   AST 15 - 41 U/L 16   15   ALT 0 - 44 U/L 12   11       RADIOGRAPHIC STUDIES: I have personally reviewed the radiological images as listed and agreed with the findings in the report. No results found.    No orders of the defined types were placed in this encounter.  All questions were answered. The patient knows to call the clinic with any problems, questions or concerns. No barriers to learning was detected. The total time spent in the appointment was 20 minutes.     Malachy Mood, MD 03/31/2023   Carolin Coy, CMA, am acting as scribe for Malachy Mood, MD.   I have reviewed the above documentation for accuracy and completeness, and I agree with the above.

## 2023-03-31 NOTE — Assessment & Plan Note (Signed)
pT2N0M0, stage IIA,  grade 3, ER-/PR-/HER2-  -Diagnosed in 04/2018. Treated with left lumpectomy, adjuvant chemo with AC-T and Adjuvant radiation.  -She is on cancer surveillance. -She is clinically doing well. Lab reviewed, her CBC and CMP are within normal limits except Hg 11.9, BG 111, Cr 1.12. Her physical exam and her 08/2020 mammogram were unremarkable except mild left breast lymphedema. There is no clinical concern for recurrence. -Continue Surveillance. Next mammogram scheduled 04/20/2023 -she is near 5 years since her initial diagnosis, risk of recurrence is minimal now, we will see her back as needed.

## 2023-04-07 ENCOUNTER — Encounter: Payer: Self-pay | Admitting: Urology

## 2023-04-07 ENCOUNTER — Ambulatory Visit (INDEPENDENT_AMBULATORY_CARE_PROVIDER_SITE_OTHER): Payer: 59 | Admitting: Urology

## 2023-04-07 VITALS — BP 153/82 | HR 86

## 2023-04-07 DIAGNOSIS — R3129 Other microscopic hematuria: Secondary | ICD-10-CM | POA: Diagnosis not present

## 2023-04-07 LAB — URINALYSIS, ROUTINE W REFLEX MICROSCOPIC
Bilirubin, UA: NEGATIVE
Ketones, UA: NEGATIVE
Nitrite, UA: NEGATIVE
Protein,UA: NEGATIVE
Specific Gravity, UA: 1.01 (ref 1.005–1.030)
Urobilinogen, Ur: 0.2 mg/dL (ref 0.2–1.0)
pH, UA: 6 (ref 5.0–7.5)

## 2023-04-07 LAB — MICROSCOPIC EXAMINATION: Bacteria, UA: NONE SEEN

## 2023-04-07 MED ORDER — CIPROFLOXACIN HCL 500 MG PO TABS
500.0000 mg | ORAL_TABLET | Freq: Once | ORAL | Status: AC
  Administered 2023-04-07: 500 mg via ORAL

## 2023-04-07 NOTE — Progress Notes (Signed)
Subjective: 1. Microhematuria     04/07/23: [name] returns today for cystoscopy to complete her hematuria w/u.  A CT hematuria study on 6/26 showed no GU abnormalities.  UA today has 3-10 RBC's.    Gu Hx: Jennifer Johns is a 67 yo female who is sent for microhematuria with 10-20 RBC's on a recent UA.  She has had no gross hematuria or voiding complaints.  She had a stone remotely.  She has had no UTI's or GU surgery.  She has a history of breast cancer treated with 2020 with a right lumpectomy and chemo/radiation.   She had cyclophosphomide as part of her treatment.   She had a renal US that showed bilateral renal atrophy but was otherwise normal. She is a former smoker and quit in 2019.   She has no incontinence and has had no flank pain.  UA has 6-10 WBC and 3-10 RBC without bacteria. ROS:  Review of Systems  All other systems reviewed and are negative.   Allergies  Allergen Reactions   Carrot [Daucus Carota] Hives and Shortness Of Breath   Other Hives    "chitlins" or pig intestine   Zantac [Ranitidine Hcl] Rash    Past Medical History:  Diagnosis Date   Asthma    Breast cancer (HCC)    left breast   Cancer (HCC)    Left Breast   COPD (chronic obstructive pulmonary disease) (HCC)    Hypertension    Personal history of chemotherapy    2019   Personal history of radiation therapy    2019    Past Surgical History:  Procedure Laterality Date   BREAST BIOPSY Left    BREAST LUMPECTOMY Left 06/07/2018   Malignant   COLONOSCOPY N/A 03/18/2016   Procedure: COLONOSCOPY;  Surgeon: West Bali, MD;  [facility]: [facility];  Service: Endoscopy;  Laterality: N/A;  10:30 AM   COLONOSCOPY N/A 07/02/2019   Procedure: COLONOSCOPY;  Surgeon: West Bali, MD;  [facility]: [facility];  Service: Endoscopy;  Laterality: N/A;  2:00   PARTIAL MASTECTOMY WITH AXILLARY SENTINEL LYMPH NODE BIOPSY Left 06/07/2018   Procedure: LEFT BREAST PARTIAL MASTECTOMY WITH AXILLARY SENTINEL LYMPH NODE BIOPSY;   Surgeon: Abigail Miyamoto, MD;  Location: [facility];  Service: General;  Laterality: Left;   POLYPECTOMY  07/02/2019   Procedure: POLYPECTOMY;  Surgeon: West Bali, MD;  [facility];  Service: Endoscopy;;   PORT-A-CATH REMOVAL N/A 07/19/2019   Procedure: REMOVAL PORT-A-CATH;  Surgeon: Abigail Miyamoto, MD;  Location: [facility];  Service: General;  Laterality: N/A;   PORTACATH PLACEMENT N/A 06/07/2018   Procedure: INSERTION PORT-A-CATH;  Surgeon: Abigail Miyamoto, MD;  Location: [facility];  Service: General;  Laterality: N/A;    Social History   Socioeconomic History   Marital status: Single    Spouse name: Not on file   Number of children: 3   Years of education: Not on file   Highest education level: Not on file  Occupational History    Comment: disabled.  Tobacco Use   Smoking status: Former    Current packs/day: 0.00    Average packs/day: 0.5 packs/day for 45.0 years (22.5 ttl pk-yrs)    Types: Cigarettes    Start date: 08/20/1973    Quit date: 08/20/2018    Years since quitting: 4.6   Smokeless tobacco: Never  Vaping Use   Vaping status: Never Used  Substance and Sexual Activity   Alcohol use: No   Drug use:  No   Sexual activity: Not Currently  Other Topics Concern   Not on file  Social History Narrative   3 children total but one passed. Reports her granddaughter lives with her.    Social Determinants of Health   Financial Resource Strain: Not on file  Food Insecurity: Not on file  Transportation Needs: Not on file  Physical Activity: Not on file  Stress: Not on file  Social Connections: Not on file  Intimate Partner Violence: Not on file    Family History  Problem Relation Age of Onset   Breast cancer Mother    Prostate cancer Father     Anti-infectives: Anti-infectives (From admission, onward)    Start     Dose/Rate Route Frequency Ordered Stop   04/07/23 1000  CIPROFLOXACIN HCL 500 MG PO TABS        500 mg Oral  Once  04/07/23 0957 04/07/23 1033       Current Outpatient Medications  Medication Sig Dispense Refill   albuterol (ACCUNEB) 1.25 MG/3ML nebulizer solution Take 1 ampule by nebulization every 6 (six) hours as needed for wheezing or shortness of breath.      alendronate (FOSAMAX) 70 MG tablet TAKE ONE TABLET BY MOUTH WEEKLY 30 minutes BEFORE breakfast; DO not lie down FOR 30 minutes AFTER taking. 4 tablet 0   aspirin 81 MG chewable tablet Chew 81 mg by mouth daily.     atorvastatin (LIPITOR) 20 MG tablet Take 20 mg by mouth daily.     budesonide-formoterol (SYMBICORT) 160-4.5 MCG/ACT inhaler Inhale 2 puffs into the lungs 2 (two) times daily.     chlorhexidine (PERIDEX) 0.12 % solution 15 mLs 2 (two) times daily.     cholecalciferol (VITAMIN D) 1000 units tablet Take 1,000 Units by mouth daily.   1   gabapentin (NEURONTIN) 300 MG capsule Take 1 capsule (300 mg total) by mouth at bedtime. 90 capsule 3   hydrochlorothiazide (MICROZIDE) 12.5 MG capsule Take 12.5 mg by mouth daily.     Ipratropium-Albuterol (ALBUTEROL-IPRATROPIUM IN)      JARDIANCE 25 MG TABS tablet Take 25 mg by mouth daily.     lisinopril (PRINIVIL,ZESTRIL) 10 MG tablet Take 10 mg by mouth daily.     metFORMIN (GLUCOPHAGE) 500 MG tablet      omeprazole (PRILOSEC) 40 MG capsule Take 40 mg by mouth daily.     traMADol (ULTRAM) 50 MG tablet Take 1 tablet (50 mg total) by mouth every 6 (six) hours as needed for moderate pain. 20 tablet 0   No current facility-administered medications for this visit.     Objective: Vital signs in last 24 hours: BP (!) 153/82   Pulse 86   Intake/Output from previous day: No intake/output data recorded. Intake/Output this shift: @IOTHISSHIFT @   Physical Exam Vitals reviewed.  Constitutional:      Appearance: Normal appearance.  Neurological:     Mental Status: She is alert.     Lab Results:  No results found for this or any previous visit (from the past 24 hour(s)).  BMET No results  for input(s): "NA", "K", "CL", "CO2", "GLUCOSE", "BUN", "CREATININE", "CALCIUM" in the last 72 hours. PT/INR No results for input(s): "LABPROT", "INR" in the last 72 hours. ABG No results for input(s): "PHART", "HCO3" in the last 72 hours.  Invalid input(s): "PCO2", "PO2"  Studies/Results: No results found. CT HEMATURIA WORKUP  Result Date: 03/20/2023 CLINICAL DATA:  Microscopic hematuria. EXAM: CT ABDOMEN AND PELVIS WITHOUT AND WITH CONTRAST TECHNIQUE: Multidetector  CT imaging of the abdomen and pelvis was performed following the standard protocol before and following the bolus administration of intravenous contrast. RADIATION DOSE REDUCTION: This exam was performed according to the departmental dose-optimization program which includes automated exposure control, adjustment of the mA and/or kV according to patient size and/or use of iterative reconstruction technique. CONTRAST:  OMNIPAQUE IOHEXOL 300 MG/ML  SOLN COMPARISON:  08/20/2018 FINDINGS: Lower Chest: No acute findings. Hepatobiliary: No suspicious hepatic masses identified. Gallbladder is unremarkable. No evidence of biliary ductal dilatation. Pancreas:  No mass or inflammatory changes. Spleen: Within normal limits in size and appearance. Adrenals/Urinary Tract: No adrenal masses identified. No evidence of urolithiasis or hydronephrosis. No suspicious renal masses identified. No masses seen involving the collecting systems, ureters, or bladder. Stomach/Bowel: No evidence of obstruction, inflammatory process or abnormal fluid collections. Vascular/Lymphatic: No pathologically enlarged lymph nodes. No acute vascular findings. Aortic atherosclerotic calcification incidentally noted. Reproductive:  No mass or other significant abnormality. Other:  None. Musculoskeletal:  No suspicious bone lesions identified. IMPRESSION: No radiographic evidence of urinary tract neoplasm, urolithiasis, or hydronephrosis. Aortic Atherosclerosis (ICD10-I70.0).  Electronically Signed   By: Danae Orleans M.D.   On: 03/20/2023 10:23    Procedure: Flexible cystoscopy.  She was prepped with betadine and the scope was inserted.  The urethra was normal.  The bladder wall was moderately trabeculated with some small diverticuli on the dome.  There were no mucosal lesions.  The UO's were normal.  Assessment/Plan: Microhematuria.  Her GU evaluation is negative and she  just needs to see me prn.   Meds ordered this encounter  Medications   ciprofloxacin (CIPRO) tablet 500 mg     Orders Placed This Encounter  Procedures   Microscopic Examination   Urinalysis, Routine w reflex microscopic   Cystoscopy     Return if symptoms worsen or fail to improve.    CC: Dr. Celso Amy and Coral Ceo FNP     Bjorn Pippin 04/08/2023

## 2023-04-20 ENCOUNTER — Ambulatory Visit
Admission: RE | Admit: 2023-04-20 | Discharge: 2023-04-20 | Disposition: A | Discharge status: Home / Self Care | Payer: 59 | Source: Ambulatory Visit | Attending: Nurse Practitioner | Admitting: Nurse Practitioner

## 2023-04-20 DIAGNOSIS — Z171 Estrogen receptor negative status [ER-]: Secondary | ICD-10-CM

## 2023-04-20 DIAGNOSIS — M85851 Other specified disorders of bone density and structure, right thigh: Secondary | ICD-10-CM

## 2023-04-22 ENCOUNTER — Telehealth: Payer: Self-pay

## 2023-04-22 NOTE — Telephone Encounter (Addendum)
Called patient and relayed message below as per Santiago Glad NP. Patient voiced full understanding.   ----- Message from Pollyann Samples sent at 04/21/2023  5:29 PM EDT ----- Please let pt know DEXA is overall stable, with slight improvement in the femur. Low fracture risk. Please continue fosamax.   Thanks, Clayborn Heron NP

## 2024-04-16 ENCOUNTER — Other Ambulatory Visit: Payer: Self-pay | Admitting: Nurse Practitioner

## 2024-04-16 DIAGNOSIS — Z853 Personal history of malignant neoplasm of breast: Secondary | ICD-10-CM

## 2024-04-26 ENCOUNTER — Ambulatory Visit
Admission: RE | Admit: 2024-04-26 | Discharge: 2024-04-26 | Disposition: A | Discharge status: Home / Self Care | Source: Ambulatory Visit | Attending: Nurse Practitioner | Admitting: Nurse Practitioner

## 2024-04-26 DIAGNOSIS — Z853 Personal history of malignant neoplasm of breast: Secondary | ICD-10-CM

## 2024-05-23 ENCOUNTER — Encounter (INDEPENDENT_AMBULATORY_CARE_PROVIDER_SITE_OTHER): Payer: Self-pay | Admitting: *Deleted

## 2024-05-30 ENCOUNTER — Encounter (INDEPENDENT_AMBULATORY_CARE_PROVIDER_SITE_OTHER): Payer: Self-pay | Admitting: *Deleted
